# Patient Record
Sex: Male | Born: 1960 | Race: White | Hispanic: No | Smoking: Current every day smoker
Health system: Southern US, Community
[De-identification: ages and names within clinical notes are randomized; demographics above are authoritative.]

## PROBLEM LIST (undated history)

## (undated) DIAGNOSIS — M519 Unspecified thoracic, thoracolumbar and lumbosacral intervertebral disc disorder: Secondary | ICD-10-CM

## (undated) DIAGNOSIS — C801 Malignant (primary) neoplasm, unspecified: Secondary | ICD-10-CM

## (undated) DIAGNOSIS — I639 Cerebral infarction, unspecified: Secondary | ICD-10-CM

## (undated) HISTORY — PX: SKIN CANCER EXCISION: SHX779

## (undated) HISTORY — PX: NOSE SURGERY: SHX723

## (undated) HISTORY — PX: FINGER SURGERY: SHX640

## (undated) HISTORY — PX: MOHS SURGERY: SUR867

---

## 2003-07-04 ENCOUNTER — Encounter: Payer: Self-pay | Admitting: Emergency Medicine

## 2003-07-04 ENCOUNTER — Emergency Department (HOSPITAL_COMMUNITY): Admission: EM | Admit: 2003-07-04 | Discharge: 2003-07-04 | Payer: Self-pay | Admitting: Emergency Medicine

## 2004-09-22 ENCOUNTER — Emergency Department (HOSPITAL_COMMUNITY): Admission: EM | Admit: 2004-09-22 | Discharge: 2004-09-23 | Payer: Self-pay | Admitting: Emergency Medicine

## 2004-11-09 ENCOUNTER — Emergency Department (HOSPITAL_COMMUNITY): Admission: EM | Admit: 2004-11-09 | Discharge: 2004-11-09 | Payer: Self-pay | Admitting: Emergency Medicine

## 2004-12-12 ENCOUNTER — Other Ambulatory Visit: Admission: RE | Admit: 2004-12-12 | Discharge: 2004-12-12 | Payer: Self-pay | Admitting: Dermatology

## 2005-04-30 ENCOUNTER — Emergency Department (HOSPITAL_COMMUNITY): Admission: EM | Admit: 2005-04-30 | Discharge: 2005-04-30 | Payer: Self-pay | Admitting: Emergency Medicine

## 2008-12-09 DIAGNOSIS — C801 Malignant (primary) neoplasm, unspecified: Secondary | ICD-10-CM

## 2008-12-09 HISTORY — DX: Malignant (primary) neoplasm, unspecified: C80.1

## 2009-05-05 ENCOUNTER — Emergency Department (HOSPITAL_COMMUNITY): Admission: EM | Admit: 2009-05-05 | Discharge: 2009-05-05 | Payer: Self-pay | Admitting: Emergency Medicine

## 2009-05-10 ENCOUNTER — Emergency Department (HOSPITAL_COMMUNITY): Admission: EM | Admit: 2009-05-10 | Discharge: 2009-05-10 | Payer: Self-pay | Admitting: Emergency Medicine

## 2009-05-11 ENCOUNTER — Emergency Department (HOSPITAL_COMMUNITY): Admission: RE | Admit: 2009-05-11 | Discharge: 2009-05-11 | Payer: Self-pay | Admitting: Emergency Medicine

## 2009-06-23 ENCOUNTER — Encounter: Admission: RE | Admit: 2009-06-23 | Discharge: 2009-06-23 | Payer: Self-pay | Admitting: Neurosurgery

## 2009-07-12 ENCOUNTER — Encounter: Admission: RE | Admit: 2009-07-12 | Discharge: 2009-07-12 | Payer: Self-pay | Admitting: Neurosurgery

## 2010-11-26 ENCOUNTER — Emergency Department (HOSPITAL_COMMUNITY)
Admission: EM | Admit: 2010-11-26 | Discharge: 2010-11-26 | Payer: Self-pay | Source: Home / Self Care | Admitting: Emergency Medicine

## 2011-02-18 LAB — CBC
HCT: 43 % (ref 39.0–52.0)
Hemoglobin: 16 g/dL (ref 13.0–17.0)
MCH: 34.6 pg — ABNORMAL HIGH (ref 26.0–34.0)
MCV: 93.1 fL (ref 78.0–100.0)
Platelets: 265 10*3/uL (ref 150–400)
RBC: 4.62 MIL/uL (ref 4.22–5.81)
WBC: 9.6 10*3/uL (ref 4.0–10.5)

## 2011-02-18 LAB — DIFFERENTIAL
Eosinophils Absolute: 0.1 10*3/uL (ref 0.0–0.7)
Eosinophils Relative: 1 % (ref 0–5)
Lymphocytes Relative: 34 % (ref 12–46)
Lymphs Abs: 3.3 10*3/uL (ref 0.7–4.0)
Monocytes Absolute: 0.6 10*3/uL (ref 0.1–1.0)
Monocytes Relative: 6 % (ref 3–12)

## 2011-02-18 LAB — BASIC METABOLIC PANEL
CO2: 24 mEq/L (ref 19–32)
Chloride: 108 mEq/L (ref 96–112)
GFR calc Af Amer: >60 mL/min (ref >60)
Potassium: 3.4 mEq/L — ABNORMAL LOW (ref 3.5–5.1)

## 2012-01-23 DIAGNOSIS — C44311 Basal cell carcinoma of skin of nose: Secondary | ICD-10-CM | POA: Insufficient documentation

## 2012-03-31 DIAGNOSIS — Z85828 Personal history of other malignant neoplasm of skin: Secondary | ICD-10-CM | POA: Insufficient documentation

## 2012-05-11 IMAGING — CR DG CHEST 1V PORT
1 series · 1 of 1 positions shown · non-contrast
Comparison: None.

CLINICAL DATA: Shortness of breath; allergic reaction and rash.

PORTABLE CHEST - 1 VIEW

[view not recorded]
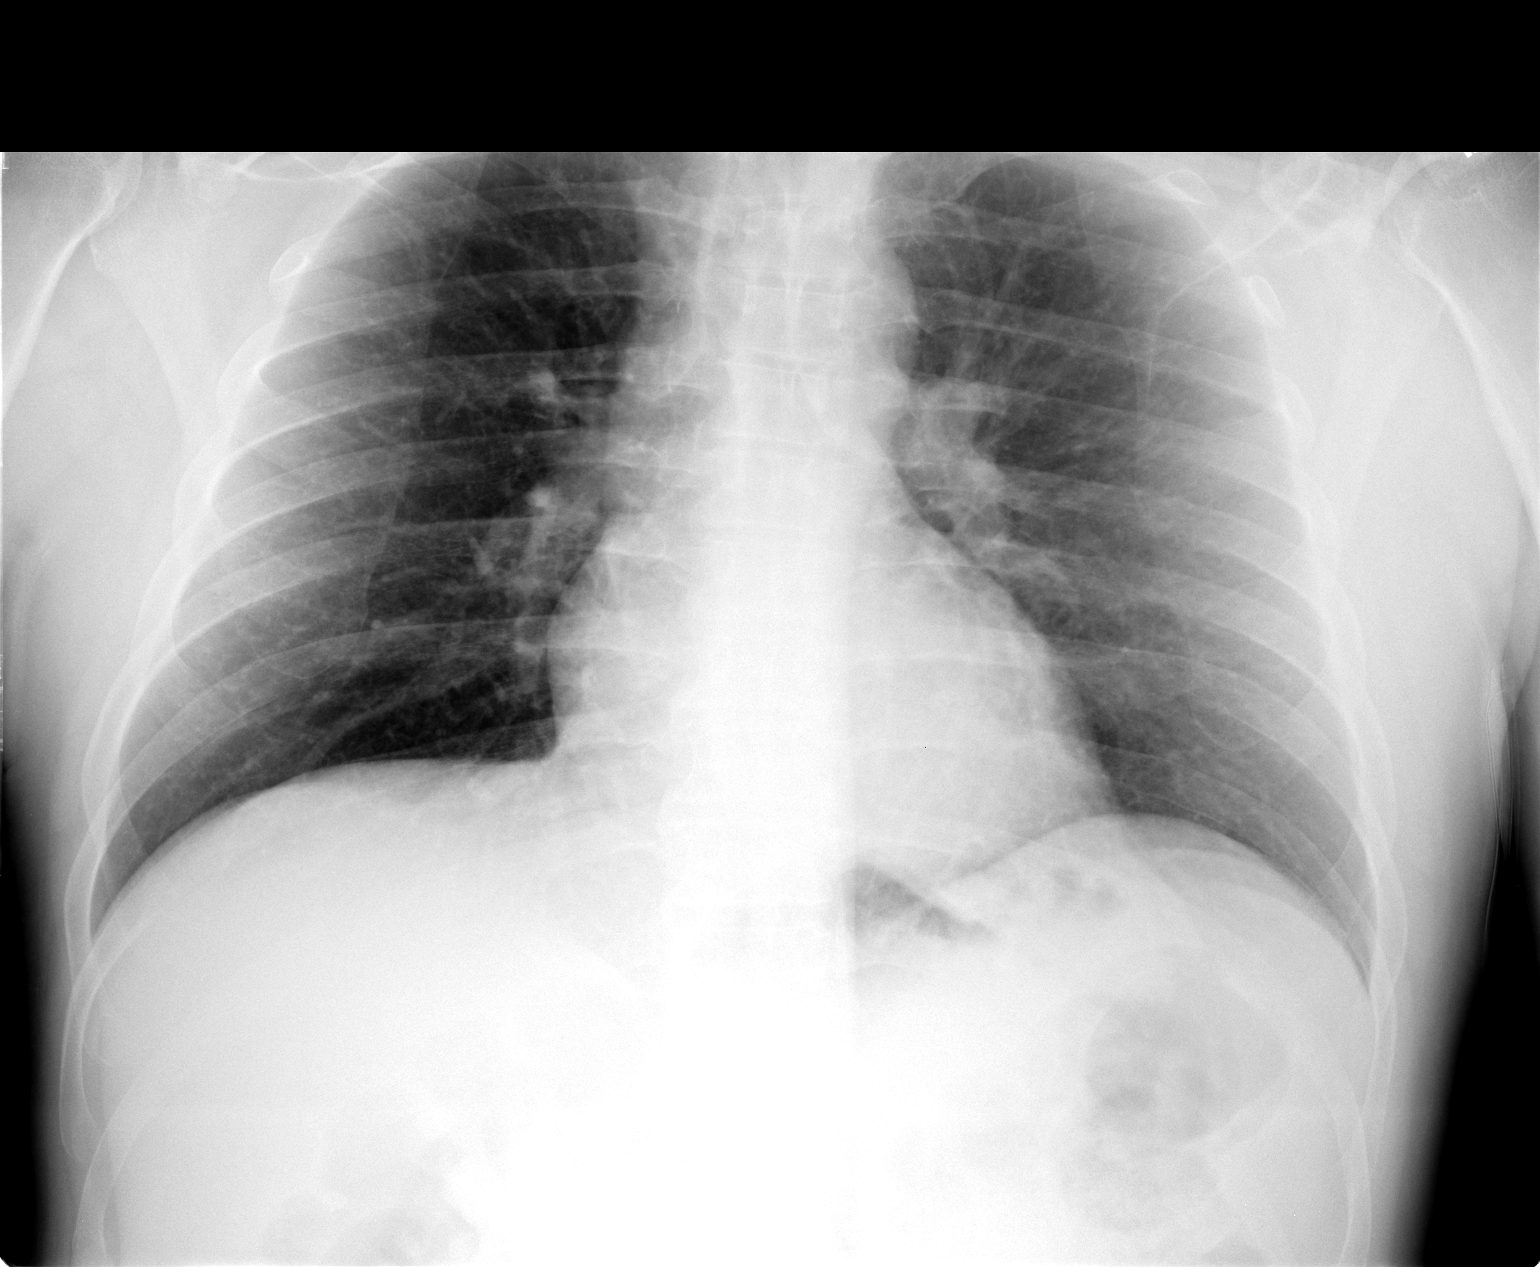

[1 of 1 positions shown; findings below may reference images not displayed]

FINDINGS: The lungs are well-aerated and clear.  There is no
evidence of focal opacification, pleural effusion or pneumothorax.

The cardiomediastinal silhouette is within normal limits.  No acute
osseous abnormalities are seen.
IMPRESSION: No acute cardiopulmonary process seen.

## 2012-09-05 ENCOUNTER — Emergency Department (HOSPITAL_COMMUNITY)
Admission: EM | Admit: 2012-09-05 | Discharge: 2012-09-05 | Disposition: A | Discharge status: Home / Self Care | Payer: Self-pay | Attending: Emergency Medicine | Admitting: Emergency Medicine

## 2012-09-05 ENCOUNTER — Encounter (HOSPITAL_COMMUNITY): Payer: Self-pay | Admitting: *Deleted

## 2012-09-05 DIAGNOSIS — F17200 Nicotine dependence, unspecified, uncomplicated: Secondary | ICD-10-CM | POA: Insufficient documentation

## 2012-09-05 DIAGNOSIS — T7840XA Allergy, unspecified, initial encounter: Secondary | ICD-10-CM | POA: Insufficient documentation

## 2012-09-05 DIAGNOSIS — I10 Essential (primary) hypertension: Secondary | ICD-10-CM | POA: Insufficient documentation

## 2012-09-05 DIAGNOSIS — L509 Urticaria, unspecified: Secondary | ICD-10-CM

## 2012-09-05 MED ORDER — PREDNISONE 50 MG PO TABS: ORAL_TABLET | ORAL | Status: DC

## 2012-09-05 MED ORDER — FAMOTIDINE 20 MG PO TABS
40.0000 mg | ORAL_TABLET | Freq: Once | ORAL | Status: AC
  Administered 2012-09-05: 40 mg via ORAL
  Filled 2012-09-05: qty 2

## 2012-09-05 MED ORDER — DEXAMETHASONE SODIUM PHOSPHATE 4 MG/ML IJ SOLN
10.0000 mg | Freq: Once | INTRAMUSCULAR | Status: AC
  Administered 2012-09-05: 10 mg via INTRAMUSCULAR
  Filled 2012-09-05: qty 3

## 2012-09-05 NOTE — ED Provider Notes (Signed)
History     CSN: [id number]  Arrival date & time 09/05/12  0103   First MD Initiated Contact with Patient 09/05/12 0107      Chief Complaint  Patient presents with  . Allergic Reaction     Patient is a 51 y.o. male presenting with allergic reaction. The history is provided by the patient.  Allergic Reaction The primary symptoms are  rash and urticaria. The primary symptoms do not include wheezing, shortness of breath, diarrhea, dizziness, altered mental status or angioedema. The current episode started 3 to 5 hours ago. The problem has been gradually worsening. This is a new problem.  The rash is associated with itching.  Associated with: unknown. Significant symptoms also include itching.  pt reports urticaria started earlier tonight Unknown cause No angioedema reported   PMH -none  History reviewed. No pertinent past surgical history.  History reviewed. No pertinent family history.  History  Substance Use Topics  . Smoking status: Current Every Day Smoker -- 1.0 packs/day    Types: Cigarettes  . Smokeless tobacco: Not on file  . Alcohol Use: Yes     occasional      Review of Systems  Respiratory: Negative for shortness of breath and wheezing.   Gastrointestinal: Negative for diarrhea.  Skin: Positive for itching and rash.  Neurological: Negative for dizziness and weakness.  Psychiatric/Behavioral: Negative for altered mental status.  All other systems reviewed and are negative.    Allergies  Review of patient's allergies indicates no known allergies.  Home Medications  No current outpatient prescriptions on file.  BP 126/78  Pulse 73  Temp 97.5 F (36.4 C) (Oral)  Resp 18  Ht 5\' 9"  (1.753 m)  Wt 180 lb (81.647 kg)  BMI 26.58 kg/m2  SpO2 98%  Physical Exam CONSTITUTIONAL: Well developed/well nourished HEAD AND FACE: Normocephalic/atraumatic EYES: EOMI/PERRL ENMT: Mucous membranes moist, no angioedema NECK: supple no meningeal  signs SPINE:entire spine nontender CV: S1/S2 noted, no murmurs/rubs/gallops noted LUNGS: Lungs are clear to auscultation bilaterally, no apparent distress ABDOMEN: soft, nontender, no rebound or guarding GU:no cva tenderness NEURO: Pt is awake/alert, moves all extremitiesx4 EXTREMITIES: pulses normal, full ROM SKIN: warm, color normal, urticaria noted PSYCH: no abnormalities of mood noted  ED Course  Procedures   No angioedema.  No wheezing noted Will treat urticaria Already took benadryl at home prior to arrival  1:52 AM Pt stable for d/c home Discussed strict return precautions including tongue/lip swelling/syncope/shortness of breath/wheezing over next 24 hours  MDM  Nursing notes including past medical history and social history reviewed and considered in documentation         Joya Gaskins, MD 09/05/12 [phone]

## 2012-09-05 NOTE — ED Notes (Addendum)
Pt reports large red itching welts over chest, back and arms.  Reporting mild SOB.  States he took 2 benadryl tablets about an hour ago and welts have improved.  States he is unsure what he is allergic to.  Pt reports previous reaction about 1 1/2 years ago.

## 2013-02-25 ENCOUNTER — Emergency Department (HOSPITAL_COMMUNITY)
Admission: EM | Admit: 2013-02-25 | Discharge: 2013-02-25 | Disposition: A | Discharge status: Home / Self Care | Payer: Self-pay | Attending: Emergency Medicine | Admitting: Emergency Medicine

## 2013-02-25 ENCOUNTER — Encounter (HOSPITAL_COMMUNITY): Payer: Self-pay | Admitting: *Deleted

## 2013-02-25 DIAGNOSIS — F17200 Nicotine dependence, unspecified, uncomplicated: Secondary | ICD-10-CM | POA: Insufficient documentation

## 2013-02-25 DIAGNOSIS — M545 Low back pain, unspecified: Secondary | ICD-10-CM | POA: Insufficient documentation

## 2013-02-25 DIAGNOSIS — G8929 Other chronic pain: Secondary | ICD-10-CM | POA: Insufficient documentation

## 2013-02-25 DIAGNOSIS — M549 Dorsalgia, unspecified: Secondary | ICD-10-CM

## 2013-02-25 DIAGNOSIS — R52 Pain, unspecified: Secondary | ICD-10-CM | POA: Insufficient documentation

## 2013-02-25 DIAGNOSIS — Z8739 Personal history of other diseases of the musculoskeletal system and connective tissue: Secondary | ICD-10-CM | POA: Insufficient documentation

## 2013-02-25 MED ORDER — PREDNISONE 10 MG PO TABS: ORAL_TABLET | ORAL | Status: DC

## 2013-02-25 MED ORDER — DICLOFENAC SODIUM 75 MG PO TBEC: 75.0000 mg | DELAYED_RELEASE_TABLET | Freq: Two times a day (BID) | ORAL | Status: DC

## 2013-02-25 MED ORDER — HYDROCODONE-ACETAMINOPHEN 5-325 MG PO TABS: 1.0000 | ORAL_TABLET | ORAL | Status: DC | PRN

## 2013-02-25 NOTE — ED Notes (Signed)
Fell 2 days ago, low back pain

## 2013-02-25 NOTE — ED Provider Notes (Signed)
History     CSN: [id number]  Arrival date & time 02/25/13  1649   First MD Initiated Contact with Patient 02/25/13 1716      Chief Complaint  Patient presents with  . Back Pain    (Consider location/radiation/quality/duration/timing/severity/associated sxs/prior treatment) Patient is a 52 y.o. male presenting with back pain. The history is provided by the patient.  Back Pain Location:  Lumbar spine Quality:  Aching and shooting Radiates to:  R posterior upper leg Pain severity:  Severe Pain is:  Same all the time Duration:  3 days Timing:  Intermittent Progression:  Worsening Chronicity: acute on chronic. Context comment:  Hx of disc disease. Acute on chronic back pain. Relieved by:  Nothing Worsened by:  Nothing tried Ineffective treatments:  None tried Associated symptoms: no abdominal pain, no bladder incontinence, no bowel incontinence, no chest pain, no dysuria and no perianal numbness   Risk factors comment:  Hx of disc disease.   History reviewed. No pertinent past medical history.  Past Surgical History  Procedure Laterality Date  . Nose surgery      History reviewed. No pertinent family history.  History  Substance Use Topics  . Smoking status: Current Every Day Smoker -- 1.00 packs/day    Types: Cigarettes  . Smokeless tobacco: Not on file  . Alcohol Use: Yes     Comment: occasional      Review of Systems  Constitutional: Negative for activity change.       All ROS Neg except as noted in HPI  HENT: Negative for nosebleeds and neck pain.   Eyes: Negative for photophobia and discharge.  Respiratory: Negative for cough, shortness of breath and wheezing.   Cardiovascular: Negative for chest pain and palpitations.  Gastrointestinal: Negative for abdominal pain, blood in stool and bowel incontinence.  Genitourinary: Negative for bladder incontinence, dysuria, frequency and hematuria.  Musculoskeletal: Positive for back pain. Negative for arthralgias.   Skin: Negative.   Neurological: Negative for dizziness, seizures and speech difficulty.  Psychiatric/Behavioral: Negative for hallucinations and confusion.    Allergies  Review of patient's allergies indicates no known allergies.  Home Medications  No current outpatient prescriptions on file.  BP 145/84  Pulse 72  Temp(Src) 97.4 F (36.3 C) (Oral)  Resp 20  Ht 5\' 9"  (1.753 m)  Wt 185 lb (83.915 kg)  BMI 27.31 kg/m2  SpO2 96%  Physical Exam  Nursing note and vitals reviewed. Constitutional: He is oriented to person, place, and time. He appears well-developed and well-nourished.  Non-toxic appearance.  HENT:  Head: Normocephalic.  Right Ear: Tympanic membrane and external ear normal.  Left Ear: Tympanic membrane and external ear normal.  Eyes: EOM and lids are normal. Pupils are equal, round, and reactive to light.  Neck: Normal range of motion. Neck supple. Carotid bruit is not present.  Cardiovascular: Normal rate, regular rhythm, normal heart sounds, intact distal pulses and normal pulses.   Pulmonary/Chest: Breath sounds normal. No respiratory distress.  Abdominal: Soft. Bowel sounds are normal. There is no tenderness. There is no guarding.  Musculoskeletal: Normal range of motion.  There is pain to palpation and with change of position involving the lower lumbar area. There is increased paraspinal area tenderness. There is no palpable deformity appreciated, no step off. No hot areas appreciated. Straight leg raise on the right causes severe pain.  Lymphadenopathy:       Head (right side): No submandibular adenopathy present.       Head (left side): No  submandibular adenopathy present.    He has no cervical adenopathy.  Neurological: He is alert and oriented to person, place, and time. He has normal strength. No cranial nerve deficit or sensory deficit. He exhibits normal muscle tone. Coordination normal.  Skin: Skin is warm and dry.  Psychiatric: He has a normal mood and  affect. His speech is normal.    ED Course  Procedures (including critical care time)  Labs Reviewed - No data to display No results found.   No diagnosis found.    MDM  I have reviewed nursing notes, vital signs, and all appropriate lab and imaging results for this patient. Vital signs stable. Patient has a history of a disc related problems. He was scheduled to have surgery on his back back in 2010, but this did not take place. The patient has not been followed since that time concerning his back. The patient sustained a fall from a standing position on ice and reinjured his lower back. The patient does not have a primary physician, who presents to the emergency department for assistance with his pain.  Plan at this time is for the patient to be on diclofenac, prednisone, Norco.       Kathie Dike, PA-C 02/25/13 1746

## 2013-02-25 NOTE — ED Provider Notes (Signed)
Medical screening examination/treatment/procedure(s) were performed by non-physician practitioner and as supervising physician I was immediately available for consultation/collaboration.  Gilda Crease, MD 02/25/13 [phone]

## 2014-01-12 ENCOUNTER — Emergency Department (HOSPITAL_COMMUNITY)
Admission: EM | Admit: 2014-01-12 | Discharge: 2014-01-12 | Disposition: A | Discharge status: Home / Self Care | Payer: Self-pay | Attending: Emergency Medicine | Admitting: Emergency Medicine

## 2014-01-12 ENCOUNTER — Encounter (HOSPITAL_COMMUNITY): Payer: Self-pay | Admitting: Emergency Medicine

## 2014-01-12 DIAGNOSIS — IMO0002 Reserved for concepts with insufficient information to code with codable children: Secondary | ICD-10-CM | POA: Insufficient documentation

## 2014-01-12 DIAGNOSIS — M549 Dorsalgia, unspecified: Secondary | ICD-10-CM

## 2014-01-12 DIAGNOSIS — Y9389 Activity, other specified: Secondary | ICD-10-CM | POA: Insufficient documentation

## 2014-01-12 DIAGNOSIS — Z85828 Personal history of other malignant neoplasm of skin: Secondary | ICD-10-CM | POA: Insufficient documentation

## 2014-01-12 DIAGNOSIS — F17200 Nicotine dependence, unspecified, uncomplicated: Secondary | ICD-10-CM | POA: Insufficient documentation

## 2014-01-12 DIAGNOSIS — X500XXA Overexertion from strenuous movement or load, initial encounter: Secondary | ICD-10-CM | POA: Insufficient documentation

## 2014-01-12 DIAGNOSIS — Y929 Unspecified place or not applicable: Secondary | ICD-10-CM | POA: Insufficient documentation

## 2014-01-12 DIAGNOSIS — G8929 Other chronic pain: Secondary | ICD-10-CM

## 2014-01-12 HISTORY — DX: Malignant (primary) neoplasm, unspecified: C80.1

## 2014-01-12 MED ORDER — NAPROXEN 250 MG PO TABS
500.0000 mg | ORAL_TABLET | Freq: Once | ORAL | Status: AC
  Administered 2014-01-12: 500 mg via ORAL
  Filled 2014-01-12: qty 2

## 2014-01-12 MED ORDER — DICLOFENAC SODIUM 75 MG PO TBEC: 75.0000 mg | DELAYED_RELEASE_TABLET | Freq: Two times a day (BID) | ORAL | Status: DC

## 2014-01-12 MED ORDER — BACLOFEN 10 MG PO TABS: 10.0000 mg | ORAL_TABLET | Freq: Three times a day (TID) | ORAL | Status: AC

## 2014-01-12 MED ORDER — PREDNISONE 50 MG PO TABS
60.0000 mg | ORAL_TABLET | Freq: Once | ORAL | Status: AC
  Administered 2014-01-12: 60 mg via ORAL
  Filled 2014-01-12 (×2): qty 1

## 2014-01-12 MED ORDER — METHOCARBAMOL 500 MG PO TABS
1000.0000 mg | ORAL_TABLET | Freq: Once | ORAL | Status: AC
  Administered 2014-01-12: 1000 mg via ORAL
  Filled 2014-01-12: qty 2

## 2014-01-12 MED ORDER — DEXAMETHASONE 4 MG PO TABS: ORAL_TABLET | ORAL | Status: DC

## 2014-01-12 NOTE — ED Notes (Signed)
Pt states he stepped into a dip and injured his back. Pt reports prior hx of disc problems. Pt states he was awaiting surgery and found out he had skin ca, had to put off the back surgery.Pt reports radiation of pain down his L leg.

## 2014-01-12 NOTE — ED Provider Notes (Signed)
Medical screening examination/treatment/procedure(s) were performed by non-physician practitioner and as supervising physician I was immediately available for consultation/collaboration.  EKG Interpretation   None         Maudry Diego, MD 01/12/14 2227

## 2014-01-12 NOTE — ED Provider Notes (Signed)
CSN: [id number]     Arrival date & time 01/12/14  1649 History   First MD Initiated Contact with Patient 01/12/14 1823     Chief Complaint  Patient presents with  . Back Pain   (Consider location/radiation/quality/duration/timing/severity/associated sxs/prior Treatment) HPI Comments: Patient is a 53 year old male who presents to the emergency department with complaint of back pain. The patient states that he has a history of" collapsing disc". The patient states that he was recently reaching and stretching while working and re\re injured his lower back. Patient states that he has been scheduled for back surgery, but he developed a skin cancer and had to put the back surgery off until he had skin cancer taken care of. He has not had any loss of bowel or bladder function. He's not had any unusual falls.  Patient is a 54 y.o. male presenting with back pain. The history is provided by the patient.  Back Pain Location:  Lumbar spine Associated symptoms: no abdominal pain, no chest pain and no dysuria     Past Medical History  Diagnosis Date  . Cancer    Past Surgical History  Procedure Laterality Date  . Nose surgery     Family History  Problem Relation Age of Onset  . Cancer Mother   . Parkinson's disease Father   . Alzheimer's disease Father    History  Substance Use Topics  . Smoking status: Current Every Day Smoker -- 1.00 packs/day    Types: Cigarettes  . Smokeless tobacco: Never Used  . Alcohol Use: No     Comment: occasional    Review of Systems  Constitutional: Negative for activity change.       All ROS Neg except as noted in HPI  HENT: Negative for nosebleeds.   Eyes: Negative for photophobia and discharge.  Respiratory: Negative for cough, shortness of breath and wheezing.   Cardiovascular: Negative for chest pain and palpitations.  Gastrointestinal: Negative for abdominal pain and blood in stool.  Genitourinary: Negative for dysuria, frequency and hematuria.   Musculoskeletal: Positive for back pain. Negative for arthralgias and neck pain.  Skin: Positive for wound.  Neurological: Negative for dizziness, seizures and speech difficulty.  Psychiatric/Behavioral: Negative for hallucinations and confusion.    Allergies  Review of patient's allergies indicates no known allergies.  Home Medications  No current outpatient prescriptions on file. BP 125/71  Pulse 83  Temp(Src) 97.7 F (36.5 C) (Oral)  Resp 16  Ht 5\' 9"  (1.753 m)  Wt 180 lb (81.647 kg)  BMI 26.57 kg/m2  SpO2 100% Physical Exam  Nursing note and vitals reviewed. Constitutional: He is oriented to person, place, and time. He appears well-developed and well-nourished.  Non-toxic appearance.  HENT:  Head: Normocephalic.  Right Ear: Tympanic membrane and external ear normal.  Left Ear: Tympanic membrane and external ear normal.  Eyes: EOM and lids are normal. Pupils are equal, round, and reactive to light.  Neck: Normal range of motion. Neck supple. Carotid bruit is not present.  Cardiovascular: Normal rate, regular rhythm, normal heart sounds, intact distal pulses and normal pulses.   Pulmonary/Chest: Breath sounds normal. No respiratory distress.  Abdominal: Soft. Bowel sounds are normal. There is no tenderness. There is no guarding.  Musculoskeletal: Normal range of motion.  There is pain at the right paraspinal and right lumbar area. There is no palpable deformity or step-off. No hot areas appreciated.  Lymphadenopathy:       Head (right side): No submandibular adenopathy present.  Head (left side): No submandibular adenopathy present.    He has no cervical adenopathy.  Neurological: He is alert and oriented to person, place, and time. He has normal strength. No cranial nerve deficit or sensory deficit.  No gross weakness or neurologic deficits appreciated of the lower extremities.  Skin: Skin is warm and dry.  Psychiatric: He has a normal mood and affect. His speech is  normal.    ED Course  Procedures (including critical care time) Labs Review Labs Reviewed - No data to display Imaging Review No results found.  EKG Interpretation   None       MDM  No diagnosis found. **I have reviewed nursing notes, vital signs, and all appropriate lab and imaging results for this patient.*  Patient has history of degenerative disc disease. He thinks he may re\re injured his back while stretching and reaching and straining while working. No gross neurologic deficits appreciated on today's examination. Prescription for Decadron, Voltaren, and baclofen given to the patient. Patient is to see the neurosurgeon and has been working with him as sone as possible for additional evaluation and management of this problem.  Lenox Ahr, PA-C 01/12/14 [phone]

## 2014-01-12 NOTE — Discharge Instructions (Signed)
Chronic Back Pain   When back pain lasts longer than 3 months, it is called chronic back pain.People with chronic back pain often go through certain periods that are more intense (flare-ups).   CAUSES  Chronic back pain can be caused by wear and tear (degeneration) on different structures in your back. These structures include:   The bones of your spine (vertebrae) and the joints surrounding your spinal cord and nerve roots (facets).   The strong, fibrous tissues that connect your vertebrae (ligaments).  Degeneration of these structures may result in pressure on your nerves. This can lead to constant pain.  HOME CARE INSTRUCTIONS   Avoid bending, heavy lifting, prolonged sitting, and activities which make the problem worse.   Take brief periods of rest throughout the day to reduce your pain. Lying down or standing usually is better than sitting while you are resting.   Take over-the-counter or prescription medicines only as directed by your caregiver.  SEEK IMMEDIATE MEDICAL CARE IF:    You have weakness or numbness in one of your legs or feet.   You have trouble controlling your bladder or bowels.   You have nausea, vomiting, abdominal pain, shortness of breath, or fainting.  Document Released: 01/02/2005 Document Revised: 02/17/2012 Document Reviewed: 11/09/2011  [employer] Patient Information 2014 [employer]

## 2014-03-03 ENCOUNTER — Emergency Department (HOSPITAL_COMMUNITY)
Admission: EM | Admit: 2014-03-03 | Discharge: 2014-03-03 | Disposition: A | Discharge status: Home / Self Care | Payer: Self-pay | Attending: Emergency Medicine | Admitting: Emergency Medicine

## 2014-03-03 ENCOUNTER — Encounter (HOSPITAL_COMMUNITY): Payer: Self-pay | Admitting: Emergency Medicine

## 2014-03-03 DIAGNOSIS — Z859 Personal history of malignant neoplasm, unspecified: Secondary | ICD-10-CM | POA: Insufficient documentation

## 2014-03-03 DIAGNOSIS — IMO0002 Reserved for concepts with insufficient information to code with codable children: Secondary | ICD-10-CM | POA: Insufficient documentation

## 2014-03-03 DIAGNOSIS — R0989 Other specified symptoms and signs involving the circulatory and respiratory systems: Secondary | ICD-10-CM | POA: Insufficient documentation

## 2014-03-03 DIAGNOSIS — F17200 Nicotine dependence, unspecified, uncomplicated: Secondary | ICD-10-CM | POA: Insufficient documentation

## 2014-03-03 DIAGNOSIS — Z791 Long term (current) use of non-steroidal anti-inflammatories (NSAID): Secondary | ICD-10-CM | POA: Insufficient documentation

## 2014-03-03 DIAGNOSIS — T7840XA Allergy, unspecified, initial encounter: Secondary | ICD-10-CM

## 2014-03-03 DIAGNOSIS — R0609 Other forms of dyspnea: Secondary | ICD-10-CM | POA: Insufficient documentation

## 2014-03-03 DIAGNOSIS — T4995XA Adverse effect of unspecified topical agent, initial encounter: Secondary | ICD-10-CM | POA: Insufficient documentation

## 2014-03-03 DIAGNOSIS — R21 Rash and other nonspecific skin eruption: Secondary | ICD-10-CM | POA: Insufficient documentation

## 2014-03-03 MED ORDER — ALBUTEROL SULFATE HFA 108 (90 BASE) MCG/ACT IN AERS
2.0000 | INHALATION_SPRAY | RESPIRATORY_TRACT | Status: DC | PRN
  Administered 2014-03-03: 2 via RESPIRATORY_TRACT
  Filled 2014-03-03: qty 6.7

## 2014-03-03 MED ORDER — FAMOTIDINE 20 MG PO TABS
20.0000 mg | ORAL_TABLET | Freq: Once | ORAL | Status: AC
  Administered 2014-03-03: 20 mg via ORAL
  Filled 2014-03-03: qty 1

## 2014-03-03 MED ORDER — PREDNISONE 20 MG PO TABS: 60.0000 mg | ORAL_TABLET | Freq: Every day | ORAL | Status: DC

## 2014-03-03 MED ORDER — DIPHENHYDRAMINE HCL 50 MG/ML IJ SOLN
25.0000 mg | Freq: Once | INTRAMUSCULAR | Status: AC
  Administered 2014-03-03: 25 mg via INTRAVENOUS
  Filled 2014-03-03: qty 1

## 2014-03-03 MED ORDER — METHYLPREDNISOLONE SODIUM SUCC 125 MG IJ SOLR
125.0000 mg | Freq: Once | INTRAMUSCULAR | Status: AC
  Administered 2014-03-03: 125 mg via INTRAVENOUS
  Filled 2014-03-03: qty 2

## 2014-03-03 MED ORDER — ALBUTEROL SULFATE (2.5 MG/3ML) 0.083% IN NEBU
5.0000 mg | INHALATION_SOLUTION | Freq: Once | RESPIRATORY_TRACT | Status: AC
  Administered 2014-03-03: 5 mg via RESPIRATORY_TRACT
  Filled 2014-03-03: qty 6

## 2014-03-03 NOTE — Discharge Instructions (Signed)
Allergies Take Benadryl as needed for any itching. Take prednisone daily as prescribed. Use inhaler as needed. Return here for any return of symptoms or difficulty breathing.   Allergies may happen from anything your body is sensitive to. This may be food, medicines, pollens, chemicals, and nearly anything around you in everyday life that produces allergens. An allergen is anything that causes an allergy producing substance. Heredity is often a factor in causing these problems. This means you may have some of the same allergies as your parents. Food allergies happen in all age groups. Food allergies are some of the most severe and life threatening. Some common food allergies are cow's milk, seafood, eggs, nuts, wheat, and soybeans. SYMPTOMS   Swelling around the mouth.  An itchy red rash or hives.  Vomiting or diarrhea.  Difficulty breathing. SEVERE ALLERGIC REACTIONS ARE LIFE-THREATENING. This reaction is called anaphylaxis. It can cause the mouth and throat to swell and cause difficulty with breathing and swallowing. In severe reactions only a trace amount of food (for example, peanut oil in a salad) may cause death within seconds. Seasonal allergies occur in all age groups. These are seasonal because they usually occur during the same season every year. They may be a reaction to molds, grass pollens, or tree pollens. Other causes of problems are house dust mite allergens, pet dander, and mold spores. The symptoms often consist of nasal congestion, a runny itchy nose associated with sneezing, and tearing itchy eyes. There is often an associated itching of the mouth and ears. The problems happen when you come in contact with pollens and other allergens. Allergens are the particles in the air that the body reacts to with an allergic reaction. This causes you to release allergic antibodies. Through a chain of events, these eventually cause you to release histamine into the blood stream. Although it is  meant to be protective to the body, it is this release that causes your discomfort. This is why you were given anti-histamines to feel better. If you are unable to pinpoint the offending allergen, it may be determined by skin or blood testing. Allergies cannot be cured but can be controlled with medicine. Hay fever is a collection of all or some of the seasonal allergy problems. It may often be treated with simple over-the-counter medicine such as diphenhydramine. Take medicine as directed. Do not drink alcohol or drive while taking this medicine. Check with your caregiver or package insert for child dosages. If these medicines are not effective, there are many new medicines your caregiver can prescribe. Stronger medicine such as nasal spray, eye drops, and corticosteroids may be used if the first things you try do not work well. Other treatments such as immunotherapy or desensitizing injections can be used if all else fails. Follow up with your caregiver if problems continue. These seasonal allergies are usually not life threatening. They are generally more of a nuisance that can often be handled using medicine. HOME CARE INSTRUCTIONS   If unsure what causes a reaction, keep a diary of foods eaten and symptoms that follow. Avoid foods that cause reactions.  If hives or rash are present:  Take medicine as directed.  You may use an over-the-counter antihistamine (diphenhydramine) for hives and itching as needed.  Apply cold compresses (cloths) to the skin or take baths in cool water. Avoid hot baths or showers. Heat will make a rash and itching worse.  If you are severely allergic:  Following a treatment for a severe reaction, hospitalization is  often required for closer follow-up.  Wear a medic-alert bracelet or necklace stating the allergy.  You and your family must learn how to give adrenaline or use an anaphylaxis kit.  If you have had a severe reaction, always carry your anaphylaxis kit  or EpiPen with you. Use this medicine as directed by your caregiver if a severe reaction is occurring. Failure to do so could have a fatal outcome. SEEK MEDICAL CARE IF:  You suspect a food allergy. Symptoms generally happen within 30 minutes of eating a food.  Your symptoms have not gone away within 2 days or are getting worse.  You develop new symptoms.  You want to retest yourself or your child with a food or drink you think causes an allergic reaction. Never do this if an anaphylactic reaction to that food or drink has happened before. Only do this under the care of a caregiver. SEEK IMMEDIATE MEDICAL CARE IF:   You have difficulty breathing, are wheezing, or have a tight feeling in your chest or throat.  You have a swollen mouth, or you have hives, swelling, or itching all over your body.  You have had a severe reaction that has responded to your anaphylaxis kit or an EpiPen. These reactions may return when the medicine has worn off. These reactions should be considered life threatening. MAKE SURE YOU:   Understand these instructions.  Will watch your condition.  Will get help right away if you are not doing well or get worse. Document Released: 02/18/2003 Document Revised: 03/22/2013 Document Reviewed: 07/25/2008 [facility] Patient Information 2014 [employer].

## 2014-03-03 NOTE — ED Provider Notes (Signed)
CSN: [id number]     Arrival date & time 03/03/14  0059 History   First MD Initiated Contact with Patient 03/03/14 0109     Chief Complaint  Patient presents with  . Allergic Reaction     (Consider location/radiation/quality/duration/timing/severity/associated sxs/prior Treatment) HPI History provided by patient. Developed itchy rash to upper extremities, torso and the top of his head earlier tonight. No facial, tongue, lips or throat swelling. He is a smoker and has some baseline dyspnea unchanged. No chest pain or chest tightness. He denies any medications. No new foods. No known exposures including soaps or detergents. Symptoms moderate in severity. No history of same. No recent illness, fevers, chills, cough or congestion. No history of similar reaction in the past.  Past Medical History  Diagnosis Date  . Cancer    Past Surgical History  Procedure Laterality Date  . Nose surgery     Family History  Problem Relation Age of Onset  . Cancer Mother   . Parkinson's disease Father   . Alzheimer's disease Father    History  Substance Use Topics  . Smoking status: Current Every Day Smoker -- 1.00 packs/day    Types: Cigarettes  . Smokeless tobacco: Never Used  . Alcohol Use: No     Comment: occasional    Review of Systems  Constitutional: Negative for fever and chills.  HENT: Negative for drooling, facial swelling, trouble swallowing and voice change.   Respiratory: Negative for shortness of breath.   Cardiovascular: Negative for chest pain.  Gastrointestinal: Negative for vomiting and abdominal pain.  Genitourinary: Negative for dysuria.  Musculoskeletal: Negative for joint swelling.  Skin: Positive for rash.  Neurological: Negative for headaches.  All other systems reviewed and are negative.      Allergies  Review of patient's allergies indicates no known allergies.  Home Medications   Current Outpatient Rx  Name  Route  Sig  Dispense  Refill  . dexamethasone  (DECADRON) 4 MG tablet      1 po bid with food   12 tablet   0   . diclofenac (VOLTAREN) 75 MG EC tablet   Oral   Take 1 tablet (75 mg total) by mouth 2 (two) times daily.   12 tablet   0   . predniSONE (DELTASONE) 20 MG tablet   Oral   Take 3 tablets (60 mg total) by mouth daily.   15 tablet   0    BP 111/78  Pulse 78  Temp(Src) 97.6 F (36.4 C)  Resp 16  Ht 5\' 9"  (1.753 m)  Wt 180 lb (81.647 kg)  BMI 26.57 kg/m2  SpO2 95% Physical Exam  Constitutional: He is oriented to person, place, and time. He appears well-developed and well-nourished.  HENT:  Head: Normocephalic and atraumatic.  Mouth/Throat: Oropharynx is clear and moist.  Uvula midline  Eyes: EOM are normal. Pupils are equal, round, and reactive to light.  Neck: Neck supple.  Cardiovascular: Normal rate, regular rhythm and intact distal pulses.   Pulmonary/Chest: Effort normal and breath sounds normal. No stridor. No respiratory distress. He has no wheezes.  Mildly prolonged expirations  Abdominal: Soft. He exhibits no distension.  Musculoskeletal: Normal range of motion. He exhibits no tenderness.  Neurological: He is alert and oriented to person, place, and time.  Skin: Skin is warm and dry.  Erythematous, blanching an urticarial rash to extremities and torso and scalp. No mucous membrane involvement.    ED Course  Procedures (including critical care time) Labs  Review Labs Reviewed - No data to display Imaging Review No results found.   Treated with IV SoluMedrol, Benadryl and oral Pepcid No wheezing but is a heavy smoker, given albuterol breathing treatment  On recheck his rash and itching is improved and his breathing does feel better. Patient given an inhaler to use as needed. Patient does not have insurance right primary care physician, is able to afford prescription for prednisone. He agrees to take steroids as directed and Benadryl as needed. He agrees to strict return precautions for any  difficulty breathing or return if symptoms. The stable appropriate for discharge home.  MDM   Final diagnoses:  Allergic reaction   Presentation suggest allergic reaction without known allergen. No anaphylaxis. No respiratory compromise. No indication for EpiPen in the ER. Symptomatically improved with medications provided. Patient observed without return of symptoms. Vital signs and nursing notes reviewed and considered.    Teressa Lower, MD 03/03/14 [id number]

## 2014-03-03 NOTE — ED Notes (Addendum)
Pt. Reports allergic reaction started 30 min. Ago. Pt. Has generalized red welts over whole body. Pt. Complains that rash burns and itches. Pt. States that this has happened before but is unsure what causes it. Pt. Alert and oriented. Pt. In no acute distress.

## 2014-05-17 ENCOUNTER — Encounter (HOSPITAL_COMMUNITY): Payer: Self-pay | Admitting: Emergency Medicine

## 2014-05-17 ENCOUNTER — Emergency Department (HOSPITAL_COMMUNITY)
Admission: EM | Admit: 2014-05-17 | Discharge: 2014-05-18 | Disposition: A | Discharge status: Home / Self Care | Payer: Self-pay | Attending: Emergency Medicine | Admitting: Emergency Medicine

## 2014-05-17 DIAGNOSIS — T7840XA Allergy, unspecified, initial encounter: Secondary | ICD-10-CM

## 2014-05-17 DIAGNOSIS — Z859 Personal history of malignant neoplasm, unspecified: Secondary | ICD-10-CM | POA: Insufficient documentation

## 2014-05-17 DIAGNOSIS — F17200 Nicotine dependence, unspecified, uncomplicated: Secondary | ICD-10-CM | POA: Insufficient documentation

## 2014-05-17 DIAGNOSIS — I509 Heart failure, unspecified: Secondary | ICD-10-CM | POA: Insufficient documentation

## 2014-05-17 DIAGNOSIS — L259 Unspecified contact dermatitis, unspecified cause: Secondary | ICD-10-CM | POA: Insufficient documentation

## 2014-05-17 MED ORDER — DIPHENHYDRAMINE HCL 50 MG/ML IJ SOLN
INTRAMUSCULAR | Status: AC
  Filled 2014-05-17: qty 1

## 2014-05-17 MED ORDER — DIPHENHYDRAMINE HCL 50 MG/ML IJ SOLN
25.0000 mg | Freq: Once | INTRAMUSCULAR | Status: AC
  Administered 2014-05-17: 25 mg via INTRAVENOUS

## 2014-05-17 MED ORDER — FAMOTIDINE IN NACL 20-0.9 MG/50ML-% IV SOLN
20.0000 mg | Freq: Once | INTRAVENOUS | Status: AC
  Administered 2014-05-17: 20 mg via INTRAVENOUS
  Filled 2014-05-17: qty 50

## 2014-05-17 MED ORDER — METHYLPREDNISOLONE SODIUM SUCC 125 MG IJ SOLR
125.0000 mg | Freq: Once | INTRAMUSCULAR | Status: AC
  Administered 2014-05-17: 125 mg via INTRAVENOUS
  Filled 2014-05-17: qty 2

## 2014-05-17 MED ORDER — SODIUM CHLORIDE 0.9 % IV BOLUS (SEPSIS)
1000.0000 mL | Freq: Once | INTRAVENOUS | Status: AC
  Administered 2014-05-17: 1000 mL via INTRAVENOUS

## 2014-05-17 NOTE — ED Provider Notes (Signed)
CSN: [id number]     Arrival date & time 05/17/14  2224 History  This chart was scribed for Nat Christen, MD by Rolanda Lundborg, ED Scribe. This patient was seen in room [facility]18/[facility]18 and the patient's care was started at 10:37 PM.   No chief complaint on file. Level 5 Caveat- urgent need for intervention HPI HPI Comments: Jason Walsh is a 53 y.o. male who presents to the Emergency Department complaining of an allergic reaction with sudden-onset itching and tightness of the throat, redness of the face, and tinnitus. He states this is the third time this has happened with unknown allergic phenomenon. No h/o heart problems. Severity is moderate to severe.  Past Medical History  Diagnosis Date  . CHF (congestive heart failure)   . Cancer    Past Surgical History  Procedure Laterality Date  . Nose surgery     Family History  Problem Relation Age of Onset  . Cancer Mother   . Parkinson's disease Father   . Alzheimer's disease Father    History  Substance Use Topics  . Smoking status: Current Every Day Smoker -- 1.00 packs/day    Types: Cigarettes  . Smokeless tobacco: Never Used  . Alcohol Use: No     Comment: occasional    Review of Systems  Unable to perform ROS: Acuity of condition      Allergies  Review of patient's allergies indicates no known allergies.  Home Medications   Prior to Admission medications   Medication Sig Start Date End Date Taking? Authorizing Provider  EPINEPHrine (EPIPEN) 0.3 mg/0.3 mL IJ SOAJ injection Inject 0.3 mLs (0.3 mg total) into the muscle as needed. 05/18/14   Nat Christen, MD  predniSONE (DELTASONE) 20 MG tablet 3 tabs po day one, then 2 po daily x 4 days 05/18/14   Nat Christen, MD   BP 125/56  Pulse 108  Temp(Src) 97.6 F (36.4 C) (Oral)  Resp 36  Wt 180 lb (81.647 kg)  SpO2 92% Physical Exam  Nursing note and vitals reviewed. Constitutional: He is oriented to person, place, and time. He appears well-developed and well-nourished.   Erythematous (beet-red)  HENT:  Head: Normocephalic and atraumatic.  Eyes: Conjunctivae and EOM are normal. Pupils are equal, round, and reactive to light.  Neck: Normal range of motion. Neck supple.  Cardiovascular: Normal rate, regular rhythm and normal heart sounds.   Pulmonary/Chest: Effort normal and breath sounds normal.  Abdominal: Soft. Bowel sounds are normal.  Musculoskeletal: Normal range of motion.  Neurological: He is alert and oriented to person, place, and time.  Skin: Skin is warm and dry.  Psychiatric: He has a normal mood and affect. His behavior is normal.    ED Course  Procedures (including critical care time) Medications  sodium chloride 0.9 % bolus 1,000 mL (0 mLs Intravenous Stopped 05/18/14 0024)  methylPREDNISolone sodium succinate (SOLU-MEDROL) 125 mg/2 mL injection 125 mg (125 mg Intravenous Given 05/17/14 2244)  diphenhydrAMINE (BENADRYL) injection 25 mg (25 mg Intravenous Given 05/17/14 2244)  famotidine (PEPCID) IVPB 20 mg (0 mg Intravenous Stopped 05/17/14 2313)    DIAGNOSTIC STUDIES: Oxygen Saturation is 92% on RA, normal by my interpretation.    COORDINATION OF CARE: 10:41 PM- Discussed treatment plan with pt which includes IV solumedrol, benadryl, and pepcid. Pt agrees to plan.    Labs Review Labs Reviewed - No data to display  Imaging Review No results found.   EKG Interpretation None      MDM   Final  diagnoses:  Allergic reaction    Patient feels much better after IV site Medrol, Benadryl, Pepcid. No airway distress. Discharge medications prednisone and EpiPen  I personally performed the services described in this documentation, which was scribed in my presence. The recorded information has been reviewed and is accurate.    Nat Christen, MD 05/20/14 2107

## 2014-05-17 NOTE — ED Notes (Signed)
Allergic reaction , face red, and discomfort in throat and chest

## 2014-05-18 MED ORDER — EPINEPHRINE 0.3 MG/0.3ML IJ SOAJ: 0.3000 mg | INTRAMUSCULAR | Status: DC | PRN

## 2014-05-18 MED ORDER — PREDNISONE 20 MG PO TABS: ORAL_TABLET | ORAL | Status: DC

## 2014-05-18 NOTE — Discharge Instructions (Signed)
Take Benadryl every 6 hours. Prescription for prednisone and EpiPen

## 2014-06-21 ENCOUNTER — Encounter (HOSPITAL_COMMUNITY): Payer: Self-pay | Admitting: Emergency Medicine

## 2014-06-21 ENCOUNTER — Emergency Department (HOSPITAL_COMMUNITY): Payer: Self-pay

## 2014-06-21 ENCOUNTER — Emergency Department (HOSPITAL_COMMUNITY)
Admission: EM | Admit: 2014-06-21 | Discharge: 2014-06-21 | Disposition: A | Discharge status: Home / Self Care | Payer: Self-pay | Attending: Emergency Medicine | Admitting: Emergency Medicine

## 2014-06-21 DIAGNOSIS — S199XXA Unspecified injury of neck, initial encounter: Secondary | ICD-10-CM

## 2014-06-21 DIAGNOSIS — S79929A Unspecified injury of unspecified thigh, initial encounter: Secondary | ICD-10-CM

## 2014-06-21 DIAGNOSIS — S79919A Unspecified injury of unspecified hip, initial encounter: Secondary | ICD-10-CM | POA: Insufficient documentation

## 2014-06-21 DIAGNOSIS — T07XXXA Unspecified multiple injuries, initial encounter: Secondary | ICD-10-CM

## 2014-06-21 DIAGNOSIS — S46909A Unspecified injury of unspecified muscle, fascia and tendon at shoulder and upper arm level, unspecified arm, initial encounter: Secondary | ICD-10-CM | POA: Insufficient documentation

## 2014-06-21 DIAGNOSIS — S0993XA Unspecified injury of face, initial encounter: Secondary | ICD-10-CM | POA: Insufficient documentation

## 2014-06-21 DIAGNOSIS — S1093XA Contusion of unspecified part of neck, initial encounter: Secondary | ICD-10-CM

## 2014-06-21 DIAGNOSIS — F17200 Nicotine dependence, unspecified, uncomplicated: Secondary | ICD-10-CM | POA: Insufficient documentation

## 2014-06-21 DIAGNOSIS — S0083XA Contusion of other part of head, initial encounter: Secondary | ICD-10-CM | POA: Insufficient documentation

## 2014-06-21 DIAGNOSIS — S0003XA Contusion of scalp, initial encounter: Secondary | ICD-10-CM | POA: Insufficient documentation

## 2014-06-21 DIAGNOSIS — S4980XA Other specified injuries of shoulder and upper arm, unspecified arm, initial encounter: Secondary | ICD-10-CM | POA: Insufficient documentation

## 2014-06-21 DIAGNOSIS — S0990XA Unspecified injury of head, initial encounter: Secondary | ICD-10-CM | POA: Insufficient documentation

## 2014-06-21 MED ORDER — OXYCODONE-ACETAMINOPHEN 5-325 MG PO TABS
2.0000 | ORAL_TABLET | Freq: Once | ORAL | Status: AC
  Administered 2014-06-21: 2 via ORAL
  Filled 2014-06-21: qty 2

## 2014-06-21 MED ORDER — HYDROCODONE-ACETAMINOPHEN 5-325 MG PO TABS: 1.0000 | ORAL_TABLET | ORAL | Status: DC | PRN
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
## 2014-06-21 NOTE — Discharge Instructions (Signed)
Contusion °A contusion is a deep bruise. Contusions are the result of an injury that caused bleeding under the skin. The contusion may turn blue, purple, or yellow. Minor injuries will give you a painless contusion, but more severe contusions may stay painful and swollen for a few weeks.  °CAUSES  °A contusion is usually caused by a blow, trauma, or direct force to an area of the body. °SYMPTOMS  °· Swelling and redness of the injured area. °· Bruising of the injured area. °· Tenderness and soreness of the injured area. °· Pain. °DIAGNOSIS  °The diagnosis can be made by taking a history and physical exam. An X-ray, CT scan, or MRI may be needed to determine if there were any associated injuries, such as fractures. °TREATMENT  °Specific treatment will depend on what area of the body was injured. In general, the best treatment for a contusion is resting, icing, elevating, and applying cold compresses to the injured area. Over-the-counter medicines may also be recommended for pain control. Ask your caregiver what the best treatment is for your contusion. °HOME CARE INSTRUCTIONS  °· Put ice on the injured area. °¨ Put ice in a plastic bag. °¨ Place a towel between your skin and the bag. °¨ Leave the ice on for 15-20 minutes, 3-4 times a day, or as directed by your health care provider. °· Only take over-the-counter or prescription medicines for pain, discomfort, or fever as directed by your caregiver. Your caregiver may recommend avoiding anti-inflammatory medicines (aspirin, ibuprofen, and naproxen) for 48 hours because these medicines may increase bruising. °· Rest the injured area. °· If possible, elevate the injured area to reduce swelling. °SEEK IMMEDIATE MEDICAL CARE IF:  °· You have increased bruising or swelling. °· You have pain that is getting worse. °· Your swelling or pain is not relieved with medicines. °MAKE SURE YOU:  °· Understand these instructions. °· Will watch your condition. °· Will get help right  away if you are not doing well or get worse. °Document Released: 09/04/2005 Document Revised: 11/30/2013 Document Reviewed: 09/30/2011 °[employer]® Patient Information ©2015 [employer]. This information is not intended to replace advice given to you by your health care provider. Make sure you discuss any questions you have with your health care provider. ° °Head Injury °You have received a head injury. It does not appear serious at this time. Headaches and vomiting are common following head injury. It should be easy to awaken from sleeping. Sometimes it is necessary for you to stay in the emergency department for a while for observation. Sometimes admission to the hospital may be needed. After injuries such as yours, most problems occur within the first 24 hours, but side effects may occur up to 7-10 days after the injury. It is important for you to carefully monitor your condition and contact your health care provider or seek immediate medical care if there is a change in your condition. °WHAT ARE THE TYPES OF HEAD INJURIES? °Head injuries can be as minor as a bump. Some head injuries can be more severe. More severe head injuries include: °· A jarring injury to the brain (concussion). °· A bruise of the brain (contusion). This mean there is bleeding in the brain that can cause swelling. °· A cracked skull (skull fracture). °· Bleeding in the brain that collects, clots, and forms a bump (hematoma). °WHAT CAUSES A HEAD INJURY? °A serious head injury is most likely to happen to someone who is in a car wreck and is not wearing   a seat belt. Other causes of major head injuries include bicycle or motorcycle accidents, sports injuries, and falls. °HOW ARE HEAD INJURIES DIAGNOSED? °A complete history of the event leading to the injury and your current symptoms will be helpful in diagnosing head injuries. Many times, pictures of the brain, such as CT or MRI are needed to see the extent of the injury. Often, an overnight  hospital stay is necessary for observation.  °WHEN SHOULD I SEEK IMMEDIATE MEDICAL CARE?  °You should get help right away if: °· You have confusion or drowsiness. °· You feel sick to your stomach (nauseous) or have continued, forceful vomiting. °· You have dizziness or unsteadiness that is getting worse. °· You have severe, continued headaches not relieved by medicine. Only take over-the-counter or prescription medicines for pain, fever, or discomfort as directed by your health care provider. °· You do not have normal function of the arms or legs or are unable to walk. °· You notice changes in the black spots in the center of the colored part of your eye (pupil). °· You have a clear or bloody fluid coming from your nose or ears. °· You have a loss of vision. °During the next 24 hours after the injury, you must stay with someone who can watch you for the warning signs. This person should contact local emergency services (911 in the [place] if you have seizures, you become unconscious, or you are unable to wake up. °HOW CAN I PREVENT A HEAD INJURY IN THE FUTURE? °The most important factor for preventing major head injuries is avoiding motor vehicle accidents.  To minimize the potential for damage to your head, it is crucial to wear seat belts while riding in motor vehicles. Wearing helmets while bike riding and playing collision sports (like football) is also helpful. Also, avoiding dangerous activities around the house will further help reduce your risk of head injury.  °WHEN CAN I RETURN TO NORMAL ACTIVITIES AND ATHLETICS? °You should be reevaluated by your health care provider before returning to these activities. If you have any of the following symptoms, you should not return to activities or contact sports until 1 week after the symptoms have stopped: °· Persistent headache. °· Dizziness or vertigo. °· Poor attention and concentration. °· Confusion. °· Memory problems. °· Nausea or vomiting. °· Fatigue or tire  easily. °· Irritability. °· Intolerant of bright lights or loud noises. °· Anxiety or depression. °· Disturbed sleep. °MAKE SURE YOU:  °· Understand these instructions. °· Will watch your condition. °· Will get help right away if you are not doing well or get worse. °Document Released: 11/25/2005 Document Revised: 11/30/2013 Document Reviewed: 08/02/2013 °ExitCare® Patient Information ©2015 [employer]. This information is not intended to replace advice given to you by your health care provider. Make sure you discuss any questions you have with your health care provider. °RICE: Routine Care for Injuries °The routine care of many injuries includes Rest, Ice, Compression, and Elevation (RICE). °HOME CARE INSTRUCTIONS °· Rest is needed to allow your body to heal. Routine activities can usually be resumed when comfortable. Injured tendons and bones can take up to 6 weeks to heal. Tendons are the cord-like structures that attach muscle to bone. °· Ice following an injury helps keep the swelling down and reduces pain. °¨ Put ice in a plastic bag. °¨ Place a towel between your skin and the bag. °¨ Leave the ice on for 15-20 minutes, 3-4 times a day, or as directed by your health care   provider. Do this while awake, for the first 24 to 48 hours. After that, continue as directed by your caregiver. °· Compression helps keep swelling down. It also gives support and helps with discomfort. If an elastic bandage has been applied, it should be removed and reapplied every 3 to 4 hours. It should not be applied tightly, but firmly enough to keep swelling down. Watch fingers or toes for swelling, bluish discoloration, coldness, numbness, or excessive pain. If any of these problems occur, remove the bandage and reapply loosely. Contact your caregiver if these problems continue. °· Elevation helps reduce swelling and decreases pain. With extremities, such as the arms, hands, legs, and feet, the injured area should be placed near or  above the level of the heart, if possible. °SEEK IMMEDIATE MEDICAL CARE IF: °· You have persistent pain and swelling. °· You develop redness, numbness, or unexpected weakness. °· Your symptoms are getting worse rather than improving after several days. °These symptoms may indicate that further evaluation or further X-rays are needed. Sometimes, X-rays may not show a small broken bone (fracture) until 1 week or 10 days later. Make a follow-up appointment with your caregiver. Ask when your X-ray results will be ready. Make sure you get your X-ray results. °Document Released: 03/09/2001 Document Revised: 11/30/2013 Document Reviewed: 04/26/2011 °[employer]® Patient Information ©2015 [employer]. This information is not intended to replace advice given to you by your health care provider. Make sure you discuss any questions you have with your health care provider. ° °
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
## 2014-07-12 ENCOUNTER — Other Ambulatory Visit (HOSPITAL_COMMUNITY): Payer: Self-pay | Admitting: *Deleted

## 2014-07-12 ENCOUNTER — Ambulatory Visit (HOSPITAL_COMMUNITY)
Admission: RE | Admit: 2014-07-12 | Discharge: 2014-07-12 | Disposition: A | Discharge status: Home / Self Care | Payer: Disability Insurance | Source: Ambulatory Visit | Attending: Diagnostic Radiology | Admitting: Diagnostic Radiology

## 2014-07-12 DIAGNOSIS — M545 Low back pain: Secondary | ICD-10-CM

## 2014-07-12 DIAGNOSIS — M5137 Other intervertebral disc degeneration, lumbosacral region: Secondary | ICD-10-CM | POA: Insufficient documentation

## 2014-07-12 DIAGNOSIS — G8929 Other chronic pain: Secondary | ICD-10-CM | POA: Insufficient documentation

## 2014-07-12 DIAGNOSIS — M51379 Other intervertebral disc degeneration, lumbosacral region without mention of lumbar back pain or lower extremity pain: Secondary | ICD-10-CM | POA: Insufficient documentation

## 2014-12-09 DIAGNOSIS — I639 Cerebral infarction, unspecified: Secondary | ICD-10-CM

## 2014-12-09 HISTORY — DX: Cerebral infarction, unspecified: I63.9

## 2015-01-03 ENCOUNTER — Emergency Department (HOSPITAL_COMMUNITY): Payer: Self-pay

## 2015-01-03 ENCOUNTER — Inpatient Hospital Stay (HOSPITAL_COMMUNITY): Payer: MEDICAID

## 2015-01-03 ENCOUNTER — Encounter (HOSPITAL_COMMUNITY): Payer: Self-pay | Admitting: *Deleted

## 2015-01-03 ENCOUNTER — Inpatient Hospital Stay (HOSPITAL_COMMUNITY): Payer: Self-pay

## 2015-01-03 ENCOUNTER — Inpatient Hospital Stay (HOSPITAL_COMMUNITY)
Admission: EM | Admit: 2015-01-03 | Discharge: 2015-01-05 | DRG: 066 | Disposition: A | Discharge status: Home / Self Care | Payer: Self-pay | Attending: Internal Medicine | Admitting: Internal Medicine

## 2015-01-03 DIAGNOSIS — R519 Headache, unspecified: Secondary | ICD-10-CM

## 2015-01-03 DIAGNOSIS — R2681 Unsteadiness on feet: Secondary | ICD-10-CM | POA: Diagnosis present

## 2015-01-03 DIAGNOSIS — E785 Hyperlipidemia, unspecified: Secondary | ICD-10-CM | POA: Diagnosis present

## 2015-01-03 DIAGNOSIS — Z720 Tobacco use: Secondary | ICD-10-CM | POA: Diagnosis present

## 2015-01-03 DIAGNOSIS — F1410 Cocaine abuse, uncomplicated: Secondary | ICD-10-CM | POA: Diagnosis present

## 2015-01-03 DIAGNOSIS — Z85828 Personal history of other malignant neoplasm of skin: Secondary | ICD-10-CM

## 2015-01-03 DIAGNOSIS — Q2112 Patent foramen ovale: Secondary | ICD-10-CM

## 2015-01-03 DIAGNOSIS — R42 Dizziness and giddiness: Secondary | ICD-10-CM

## 2015-01-03 DIAGNOSIS — Z820 Family history of epilepsy and other diseases of the nervous system: Secondary | ICD-10-CM

## 2015-01-03 DIAGNOSIS — F17210 Nicotine dependence, cigarettes, uncomplicated: Secondary | ICD-10-CM | POA: Diagnosis present

## 2015-01-03 DIAGNOSIS — I639 Cerebral infarction, unspecified: Principal | ICD-10-CM | POA: Diagnosis present

## 2015-01-03 DIAGNOSIS — Z809 Family history of malignant neoplasm, unspecified: Secondary | ICD-10-CM

## 2015-01-03 DIAGNOSIS — R51 Headache: Secondary | ICD-10-CM

## 2015-01-03 DIAGNOSIS — I509 Heart failure, unspecified: Secondary | ICD-10-CM | POA: Diagnosis present

## 2015-01-03 DIAGNOSIS — Q211 Atrial septal defect: Secondary | ICD-10-CM

## 2015-01-03 LAB — RAPID URINE DRUG SCREEN, HOSP PERFORMED
Amphetamines: NOT DETECTED
BARBITURATES: NOT DETECTED
Benzodiazepines: NOT DETECTED
Cocaine: POSITIVE — AB
Opiates: POSITIVE — AB
Tetrahydrocannabinol: NOT DETECTED

## 2015-01-03 LAB — BASIC METABOLIC PANEL
Anion gap: 4 — ABNORMAL LOW (ref 5–15)
BUN: 22 mg/dL (ref 6–23)
CO2: 29 mmol/L (ref 19–32)
Calcium: 9 mg/dL (ref 8.4–10.5)
Chloride: 106 mmol/L (ref 96–112)
Creatinine, Ser: 1.21 mg/dL (ref 0.50–1.35)
GFR calc Af Amer: 77 mL/min — ABNORMAL LOW (ref >90)
GFR calc non Af Amer: 66 mL/min — ABNORMAL LOW (ref >90)
GLUCOSE: 90 mg/dL (ref 70–99)
Potassium: 3.8 mmol/L (ref 3.5–5.1)
SODIUM: 139 mmol/L (ref 135–145)

## 2015-01-03 LAB — CBC WITH DIFFERENTIAL/PLATELET
BASOS ABS: 0 10*3/uL (ref 0.0–0.1)
Basophils Relative: 0 % (ref 0–1)
EOS ABS: 0 10*3/uL (ref 0.0–0.7)
Eosinophils Relative: 0 % (ref 0–5)
HEMATOCRIT: 44 % (ref 39.0–52.0)
Hemoglobin: 15.1 g/dL (ref 13.0–17.0)
LYMPHS PCT: 24 % (ref 12–46)
Lymphs Abs: 2.2 10*3/uL (ref 0.7–4.0)
MCH: 32.9 pg (ref 26.0–34.0)
MCHC: 34.3 g/dL (ref 30.0–36.0)
MCV: 95.9 fL (ref 78.0–100.0)
Monocytes Absolute: 0.6 10*3/uL (ref 0.1–1.0)
Monocytes Relative: 6 % (ref 3–12)
NEUTROS ABS: 6.7 10*3/uL (ref 1.7–7.7)
Neutrophils Relative %: 70 % (ref 43–77)
Platelets: 231 10*3/uL (ref 150–400)
RBC: 4.59 MIL/uL (ref 4.22–5.81)
RDW: 12.3 % (ref 11.5–15.5)
WBC: 9.5 10*3/uL (ref 4.0–10.5)

## 2015-01-03 LAB — TROPONIN I

## 2015-01-03 LAB — MRSA PCR SCREENING: MRSA BY PCR: NEGATIVE

## 2015-01-03 MED ORDER — SENNOSIDES-DOCUSATE SODIUM 8.6-50 MG PO TABS: 1.0000 | ORAL_TABLET | Freq: Every evening | ORAL | Status: DC | PRN

## 2015-01-03 MED ORDER — HYDROCODONE-ACETAMINOPHEN 5-325 MG PO TABS
1.0000 | ORAL_TABLET | ORAL | Status: DC | PRN
  Administered 2015-01-04: 1 via ORAL
  Administered 2015-01-05: 2 via ORAL
  Administered 2015-01-05: 1 via ORAL
  Filled 2015-01-03 (×2): qty 1
  Filled 2015-01-03: qty 2
  Filled 2015-01-03: qty 1

## 2015-01-03 MED ORDER — SODIUM CHLORIDE 0.9 % IV SOLN: Freq: Once | INTRAVENOUS | Status: DC

## 2015-01-03 MED ORDER — ASPIRIN 325 MG PO TABS
325.0000 mg | ORAL_TABLET | Freq: Every day | ORAL | Status: DC
  Administered 2015-01-03 – 2015-01-05 (×3): 325 mg via ORAL
  Filled 2015-01-03 (×3): qty 1

## 2015-01-03 MED ORDER — MECLIZINE HCL 12.5 MG PO TABS
25.0000 mg | ORAL_TABLET | Freq: Once | ORAL | Status: AC
  Administered 2015-01-03: 25 mg via ORAL
  Filled 2015-01-03: qty 2

## 2015-01-03 MED ORDER — ONDANSETRON HCL 4 MG/2ML IJ SOLN
4.0000 mg | Freq: Once | INTRAMUSCULAR | Status: AC
  Administered 2015-01-03: 4 mg via INTRAVENOUS
  Filled 2015-01-03: qty 2

## 2015-01-03 MED ORDER — STROKE: EARLY STAGES OF RECOVERY BOOK
Freq: Once | Status: AC
  Administered 2015-01-03: 17:00:00
  Filled 2015-01-03: qty 1

## 2015-01-03 MED ORDER — MORPHINE SULFATE 4 MG/ML IJ SOLN
4.0000 mg | INTRAMUSCULAR | Status: DC | PRN
  Administered 2015-01-03: 4 mg via INTRAVENOUS
  Filled 2015-01-03: qty 1

## 2015-01-03 MED ORDER — SODIUM CHLORIDE 0.9 % IV BOLUS (SEPSIS)
500.0000 mL | Freq: Once | INTRAVENOUS | Status: AC
  Administered 2015-01-03: 500 mL via INTRAVENOUS

## 2015-01-03 NOTE — H&P (Signed)
[facility] History and Physical  Jason Walsh EQA:[id number] DOB: 1961/08/29 DOA: 01/03/2015  Referring physician: ER PCP: No primary care provider on file.   Chief Complaint: Dizziness, instability of gait.  HPI: Jason Walsh is a 54 y.o. male  This is a 54 year old man who presents with a 2 day history of onset of dizziness which was associated with gait instability. He tended to be her over to the right side every time he tried to walk. He states that his symptoms started after sneezing forcefully twice 2 days ago. He has no history of hypertension that has been documented. He does not have a primary care physician. He felt nauseous and vomited several 2 days ago. He apparently had some sort of erythematous rash on his body and he says that this was his allergic reaction that he sometimes gets. He gave himself Benadryl and became more drowsy. He then fell asleep.  He woke up today still in a unsteady state with his gait. He has noticed that his speech is slightly slurred and he has been having difficulty getting out his words. He thinks his vision is somewhat blurred. He denies any chest pain, palpitations, dyspnea. He does admit to occasionally using cocaine, however he cannot remember when he last used it. He is a smoker of cigarettes. Evaluation in the emergency room with a CT brain scan shows him to have a cerebellar infarct with possible hemorrhage. He is now being admitted for further evaluation and management.   Review of Systems:  Apart from symptoms above, all systems negative.  Past Medical History  Diagnosis Date  . CHF (congestive heart failure)   . Cancer     skin   Past Surgical History  Procedure Laterality Date  . Nose surgery     Social History:  reports that he has been smoking Cigarettes.  He has been smoking about 1.00 pack per day. He has never used smokeless tobacco. He reports that he does not drink alcohol or use illicit drugs.  No Known  Allergies  Family History  Problem Relation Age of Onset  . Cancer Mother   . Parkinson's disease Father   . Alzheimer's disease Father       Prior to Admission medications   Medication Sig Start Date End Date Taking? Authorizing Provider  EPINEPHrine (EPIPEN) 0.3 mg/0.3 mL IJ SOAJ injection Inject 0.3 mLs (0.3 mg total) into the muscle as needed. Patient not taking: Reported on 01/03/2015 05/18/14   Nat Christen, MD  HYDROcodone-acetaminophen (NORCO/VICODIN) 5-325 MG per tablet Take 1-2 tablets by mouth every 4 (four) hours as needed for moderate pain or severe pain. Patient not taking: Reported on 01/03/2015 06/21/14   Delice Bison Ward, DO   Physical Exam: Filed Vitals:   01/03/15 1323 01/03/15 1451  BP: 132/90 144/95  Pulse: 63 61  Temp: 97.9 F (36.6 C)   TempSrc: Oral   Resp: 16 17  Height: 5\' 9"  (1.753 m)   Weight: 79.379 kg (175 lb)   SpO2: 100% 100%    Wt Readings from Last 3 Encounters:  01/03/15 79.379 kg (175 lb)  06/21/14 79.379 kg (175 lb)  05/17/14 81.647 kg (180 lb)    General:  Appears calm and comfortable. He is alert and orientated. Eyes: PERRL, normal lids, irises & conjunctiva ENT: grossly normal hearing, lips & tongue Neck: no LAD, masses or thyromegaly Cardiovascular: RRR, no m/r/g. No LE edema. Telemetry: SR, no arrhythmias  Respiratory: CTA bilaterally, no w/r/r. Normal respiratory effort.  Abdomen: soft, ntnd Skin: no rash or induration seen on limited exam Musculoskeletal: grossly normal tone BUE/BLE Psychiatric: grossly normal mood and affect, speech fluent and appropriate Neurologic: grossly non-focal. surprisingly, he does not appear to have any significant cerebellar signs. He does not have any focal weakness. His speech appears to be normal with no dysarthria or dysphasia.           Labs on Admission:  Basic Metabolic Panel:  Recent Labs Lab 01/03/15 1412  NA 139  K 3.8  CL 106  CO2 29  GLUCOSE 90  BUN 22  CREATININE 1.21  CALCIUM  9.0   Liver Function Tests: No results for input(s): AST, ALT, ALKPHOS, BILITOT, PROT, ALBUMIN in the last 168 hours. No results for input(s): LIPASE, AMYLASE in the last 168 hours. No results for input(s): AMMONIA in the last 168 hours. CBC:  Recent Labs Lab 01/03/15 1412  WBC 9.5  NEUTROABS 6.7  HGB 15.1  HCT 44.0  MCV 95.9  PLT 231   Cardiac Enzymes:  Recent Labs Lab 01/03/15 1412  TROPONINI <0.03    BNP (last 3 results) No results for input(s): PROBNP in the last 8760 hours. CBG: No results for input(s): GLUCAP in the last 168 hours.  Radiological Exams on Admission: Ct Head Wo Contrast  01/03/2015   CLINICAL DATA:  Two week history of headache and dizziness  EXAM: CT HEAD WITHOUT CONTRAST  CT MAXILLOFACIAL WITHOUT CONTRAST  TECHNIQUE: Multidetector CT imaging of the head and maxillofacial structures were performed using the standard protocol without intravenous contrast. Multiplanar CT image reconstructions of the maxillofacial structures were also generated.  COMPARISON:  June 21, 2014  FINDINGS: CT HEAD FINDINGS  The ventricles are normal in size and configuration. There is no intracranial mass, extra-axial fluid collection, or midline shift.  There is decreased attenuation in the left inferomedial cerebellum in the distribution of a portion of the left posterior inferior cerebellar artery. An acute infarct in this area is felt to be present. Cytotoxic edema from this infarct does not efface the fourth ventricle. On axial slice 5 series 2, there is a suspected small area of hemorrhage along the periphery of this infarct inferomedially. No other hemorrhage is appreciable on this study. The fourth ventricle appears unremarkable. Elsewhere, gray-white compartments appear normal. The bony calvarium appears intact. The mastoid air cells are clear.  CT MAXILLOFACIAL FINDINGS  There is no fracture or dislocation. The orbits appear symmetric and normal bilaterally. No intraorbital  lesions are identified.  Paranasal sinuses are clear. Ostiomeatal unit complexes are patent bilaterally. There is mild rightward deviation of the nasal septum. There is middle and inferior nasal turbinate edema bilaterally. There is nares narrowing but no nares obstruction. There is no demonstrable adenopathy. Salivary glands appear symmetric and normal bilaterally.  IMPRESSION: CT head: Recent/acute infarct in the left posterior and inferior, medial left cerebellum involving a portion of the left posterior inferior cerebellar artery distribution. On axial slice 5 series 2, there is an equivocal area of suspected hemorrhage along the inferomedial aspect of this infarct. No other hemorrhage. No mass seen. Study otherwise unremarkable.  CT maxillofacial: No fracture or dislocation. Paranasal sinuses are clear. Ostiomeatal unit complexes are patent. There is nasal turbinate edema bilaterally without nares obstruction. There is mild nasal septal deviation. There is a benign appearing cystic area in the lateral aspect of the C3 vertebral body on the left.  Critical Value/emergent results were called by telephone at the time of interpretation on 01/03/2015 at  2:43 pm to Dr. Tanna Furry , who verbally acknowledged these results.   Electronically Signed   By: Lowella Grip M.D.   On: 01/03/2015 14:45   Ct Maxillofacial Wo Cm  01/03/2015   CLINICAL DATA:  Two week history of headache and dizziness  EXAM: CT HEAD WITHOUT CONTRAST  CT MAXILLOFACIAL WITHOUT CONTRAST  TECHNIQUE: Multidetector CT imaging of the head and maxillofacial structures were performed using the standard protocol without intravenous contrast. Multiplanar CT image reconstructions of the maxillofacial structures were also generated.  COMPARISON:  June 21, 2014  FINDINGS: CT HEAD FINDINGS  The ventricles are normal in size and configuration. There is no intracranial mass, extra-axial fluid collection, or midline shift.  There is decreased attenuation in  the left inferomedial cerebellum in the distribution of a portion of the left posterior inferior cerebellar artery. An acute infarct in this area is felt to be present. Cytotoxic edema from this infarct does not efface the fourth ventricle. On axial slice 5 series 2, there is a suspected small area of hemorrhage along the periphery of this infarct inferomedially. No other hemorrhage is appreciable on this study. The fourth ventricle appears unremarkable. Elsewhere, gray-white compartments appear normal. The bony calvarium appears intact. The mastoid air cells are clear.  CT MAXILLOFACIAL FINDINGS  There is no fracture or dislocation. The orbits appear symmetric and normal bilaterally. No intraorbital lesions are identified.  Paranasal sinuses are clear. Ostiomeatal unit complexes are patent bilaterally. There is mild rightward deviation of the nasal septum. There is middle and inferior nasal turbinate edema bilaterally. There is nares narrowing but no nares obstruction. There is no demonstrable adenopathy. Salivary glands appear symmetric and normal bilaterally.  IMPRESSION: CT head: Recent/acute infarct in the left posterior and inferior, medial left cerebellum involving a portion of the left posterior inferior cerebellar artery distribution. On axial slice 5 series 2, there is an equivocal area of suspected hemorrhage along the inferomedial aspect of this infarct. No other hemorrhage. No mass seen. Study otherwise unremarkable.  CT maxillofacial: No fracture or dislocation. Paranasal sinuses are clear. Ostiomeatal unit complexes are patent. There is nasal turbinate edema bilaterally without nares obstruction. There is mild nasal septal deviation. There is a benign appearing cystic area in the lateral aspect of the C3 vertebral body on the left.  Critical Value/emergent results were called by telephone at the time of interpretation on 01/03/2015 at 2:43 pm to Dr. Tanna Furry , who verbally acknowledged these results.    Electronically Signed   By: Lowella Grip M.D.   On: 01/03/2015 14:45    EKG: Independently reviewed. Normal sinus rhythm without any acute ST-T wave changes.  Assessment/Plan   1. 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 He will be admitted for stroke workup including MRI, echocardiogram and carotid Dopplers. He will be seen by neurology. I'm somewhat hesitant to give him aspirin or heparin at the present time in view of the possible hemorrhage that has been visualized. MRI brain will be useful. 2. Tobacco abuse. 3. History of cocaine abuse. Obtain urine drug screen.  Further recommendations will depend on patient's hospital progress.   Code Status: Full code.  DVT Prophylaxis: SCDs.  Family Communication: I discussed the plan with the patient at the bedside.   Disposition Plan: Home when medically stable.  Time spent: 60 minutes.  Doree Albee [facility] Pager [phone].

## 2015-01-03 NOTE — ED Provider Notes (Signed)
CSN: [id number]     Arrival date & time 01/03/15  1323 History  This chart was scribed for Tanna Furry, MD by Einar Pheasant, ED Scribe. This patient was seen in room [facility]14/[facility]14 and the patient's care was started at 1:45 PM.    Chief Complaint  Patient presents with  . Dizziness    The history is provided by the patient and medical records. No language interpreter was used.   HPI Comments: Jason Walsh is a 54 y.o. male with PMhx of skin CA and CHF listed below presents to the Emergency Department complaining of sudden onset dizziness that started approximately 2 days ago. He states that his symptoms started after sneezing forcefully 2 times Sunday night.  He states that everything went white and "glittery" with his vision. Following that he started having several episodes of emesis   He also states that he  noted erythematous rash to his body.  He took benadryl, and rash resolved, but he vomited  Symptoms listed above have now resolved, however, pt states that ever since then he has been feeling off balance and has to hold on to objects to remain standing. He describes his dizziness as a room spinning sensation. Pt endorses nausea when he feels dizzy. Denies any heart problems. Pt denies fever, neck pain, sore throat,CP, cough, SOB, abdominal pain, nausea, emesis, diarrhea, urinary symptoms, back pai, weakness, numbness and rash as associated symptoms.    Complains of a "bad headache" and indicates to his parietal occipital head on the left.     Past Medical History  Diagnosis Date  . CHF (congestive heart failure)   . Cancer     skin   Past Surgical History  Procedure Laterality Date  . Nose surgery     Family History  Problem Relation Age of Onset  . Cancer Mother   . Parkinson's disease Father   . Alzheimer's disease Father    History  Substance Use Topics  . Smoking status: Current Every Day Smoker -- 1.00 packs/day    Types: Cigarettes  . Smokeless tobacco: Never Used   . Alcohol Use: Yes     Comment: occasional    Review of Systems  Constitutional: Negative for fever, chills, diaphoresis, appetite change and fatigue.  HENT: Negative for mouth sores, sore throat and trouble swallowing.   Eyes: Positive for visual disturbance.  Respiratory: Negative for cough, chest tightness, shortness of breath and wheezing.   Cardiovascular: Negative for chest pain.  Gastrointestinal: Positive for nausea and vomiting. Negative for abdominal pain, diarrhea and abdominal distention.  Endocrine: Negative for polydipsia, polyphagia and polyuria.  Genitourinary: Negative for dysuria, frequency and hematuria.  Musculoskeletal: Negative for gait problem.  Skin: Negative for color change, pallor and rash.  Neurological: Positive for dizziness. Negative for syncope, light-headedness and headaches.  Hematological: Does not bruise/bleed easily.  Psychiatric/Behavioral: Negative for behavioral problems and confusion.      Allergies  Review of patient's allergies indicates no known allergies.  Home Medications   Prior to Admission medications   Medication Sig Start Date End Date Taking? Authorizing Provider  EPINEPHrine (EPIPEN) 0.3 mg/0.3 mL IJ SOAJ injection Inject 0.3 mLs (0.3 mg total) into the muscle as needed. Patient not taking: Reported on 01/03/2015 05/18/14   Nat Christen, MD  HYDROcodone-acetaminophen (NORCO/VICODIN) 5-325 MG per tablet Take 1-2 tablets by mouth every 4 (four) hours as needed for moderate pain or severe pain. Patient not taking: Reported on 01/03/2015 06/21/14   [place], DO  Triage Vitals: BP 132/90 mmHg  Pulse 63  Temp(Src) 97.9 F (36.6 C) (Oral)  Resp 16  Ht 5\' 9"  (1.753 m)  Wt 175 lb (79.379 kg)  BMI 25.83 kg/m2  SpO2 100%  Physical Exam  Constitutional: He is oriented to person, place, and time. He appears well-developed and well-nourished. No distress.  HENT:  Head: Normocephalic.  Eyes: Conjunctivae and EOM are normal.  Pupils are equal, round, and reactive to light. No scleral icterus.  1 beat of nystagmus with lateral gaze.   Neck: Normal range of motion. Neck supple. No thyromegaly present.  Cardiovascular: Normal rate, regular rhythm, normal heart sounds and intact distal pulses.  Exam reveals no gallop and no friction rub.   No murmur heard. Pulmonary/Chest: Effort normal and breath sounds normal. No respiratory distress. He has no wheezes. He has no rales.  Abdominal: Soft. Bowel sounds are normal. He exhibits no distension. There is no tenderness. There is no rebound.  Musculoskeletal: Normal range of motion.  Neurological: He is alert and oriented to person, place, and time. No cranial nerve deficit.  No pronator drift. No weakness. Normal cranial nerves. Normal strength.   Skin: Skin is warm and dry. No rash noted.  Psychiatric: He has a normal mood and affect. His behavior is normal.    ED Course  Procedures (including critical care time)  DIAGNOSTIC STUDIES: Oxygen Saturation is 100% on RA, normal by my interpretation.    COORDINATION OF CARE: 1:54 PM- Pt advised of plan for treatment and pt agrees.  Labs Review Labs Reviewed  BASIC METABOLIC PANEL - Abnormal; Notable for the following:    GFR calc non Af Amer 66 (*)    GFR calc Af Amer 77 (*)    Anion gap 4 (*)    All other components within normal limits  MRSA PCR SCREENING  CBC WITH DIFFERENTIAL/PLATELET  TROPONIN I  DRUGS OF ABUSE SCREEN W/O ALC, ROUTINE URINE  HEMOGLOBIN A1C  LIPID PANEL    Imaging Review Dg Chest 2 View  01/03/2015   CLINICAL DATA:  Difficulty breathing  EXAM: CHEST  2 VIEW  COMPARISON:  November 26, 2010  FINDINGS: There is an ill-defined opacity measuring 2.5 x 1.6 cm just superior to the anterior heart border on the lateral view. This finding is not seen on the frontal view. Lungs elsewhere are clear. Heart size and pulmonary vascularity are normal. No adenopathy. No bone lesions.  IMPRESSION: Somewhat  ill-defined opacity just superior to the anterior heart border seen only on the lateral view. This finding does appear subtly nodular and warrants noncontrast enhanced chest CT to further assess.  Elsewhere lungs are clear.  Heart size within normal limits.   Electronically Signed   By: Lowella Grip M.D.   On: 01/03/2015 20:18   Ct Head Wo Contrast  01/03/2015   CLINICAL DATA:  Two week history of headache and dizziness  EXAM: CT HEAD WITHOUT CONTRAST  CT MAXILLOFACIAL WITHOUT CONTRAST  TECHNIQUE: Multidetector CT imaging of the head and maxillofacial structures were performed using the standard protocol without intravenous contrast. Multiplanar CT image reconstructions of the maxillofacial structures were also generated.  COMPARISON:  June 21, 2014  FINDINGS: CT HEAD FINDINGS  The ventricles are normal in size and configuration. There is no intracranial mass, extra-axial fluid collection, or midline shift.  There is decreased attenuation in the left inferomedial cerebellum in the distribution of a portion of the left posterior inferior cerebellar artery. An acute infarct in this area  is felt to be present. Cytotoxic edema from this infarct does not efface the fourth ventricle. On axial slice 5 series 2, there is a suspected small area of hemorrhage along the periphery of this infarct inferomedially. No other hemorrhage is appreciable on this study. The fourth ventricle appears unremarkable. Elsewhere, gray-white compartments appear normal. The bony calvarium appears intact. The mastoid air cells are clear.  CT MAXILLOFACIAL FINDINGS  There is no fracture or dislocation. The orbits appear symmetric and normal bilaterally. No intraorbital lesions are identified.  Paranasal sinuses are clear. Ostiomeatal unit complexes are patent bilaterally. There is mild rightward deviation of the nasal septum. There is middle and inferior nasal turbinate edema bilaterally. There is nares narrowing but no nares obstruction.  There is no demonstrable adenopathy. Salivary glands appear symmetric and normal bilaterally.  IMPRESSION: CT head: Recent/acute infarct in the left posterior and inferior, medial left cerebellum involving a portion of the left posterior inferior cerebellar artery distribution. On axial slice 5 series 2, there is an equivocal area of suspected hemorrhage along the inferomedial aspect of this infarct. No other hemorrhage. No mass seen. Study otherwise unremarkable.  CT maxillofacial: No fracture or dislocation. Paranasal sinuses are clear. Ostiomeatal unit complexes are patent. There is nasal turbinate edema bilaterally without nares obstruction. There is mild nasal septal deviation. There is a benign appearing cystic area in the lateral aspect of the C3 vertebral body on the left.  Critical Value/emergent results were called by telephone at the time of interpretation on 01/03/2015 at 2:43 pm to Dr. Tanna Furry , who verbally acknowledged these results.   Electronically Signed   By: Lowella Grip M.D.   On: 01/03/2015 14:45   Mr Brain Wo Contrast  01/03/2015   CLINICAL DATA:  Stroke. Severe vertigo for 2 days with blurred vision, headache, vomiting, and nausea.  EXAM: MRI HEAD WITHOUT CONTRAST  MRA HEAD WITHOUT CONTRAST  TECHNIQUE: Multiplanar, multiecho pulse sequences of the brain and surrounding structures were obtained without intravenous contrast. Angiographic images of the head were obtained using MRA technique without contrast.  COMPARISON:  Head CT 01/03/2015  FINDINGS: MRI HEAD FINDINGS  There is a large, acute left PICA territory infarct involving the left cerebellum and inferior vermis. As suspected on CT, there is a small amount of associated petechial hemorrhage anteriorly in the inferior left cerebellum. There is associated cytotoxic edema with partial effacement of the fourth ventricle. There is no hydrocephalus. Cerebral volume is within normal limits for age. A few small foci of T2  hyperintensity in the subcortical cerebral white matter are nonspecific but may reflect minimal chronic small vessel ischemic disease. There is no mass, midline shift or extra-axial fluid collection.  Orbits are unremarkable. Paranasal sinuses and mastoid air cells are clear. Major intracranial vascular flow voids are preserved.  MRA HEAD FINDINGS  Visualized distal vertebral arteries are patent with the left being minimally larger than the right. Proximal PICAs are patent. The left PICA may be occluded more distally, however evaluation is limited by its small size at this level. Right AICA and bilateral SCA origins are patent. Basilar artery is patent without stenosis. There are patent posterior communicating arteries bilaterally. PCAs are unremarkable.  Internal carotid arteries are patent from skullbase to carotid termini without stenosis. ACAs and MCAs are unremarkable. No intracranial aneurysm is identified.  IMPRESSION: 1. Acute, large left PICA territory infarct with small amount of petechial hemorrhage. Edema with partial effacement of the fourth ventricle. No hydrocephalus. 2. No major intracranial arterial  occlusion or significant stenosis. Left PICA is patent proximally but may be occluded more distally.   Electronically Signed   By: Logan Bores   On: 01/03/2015 20:58   Mr Jodene Nam Head/brain Wo Cm  01/03/2015   CLINICAL DATA:  Stroke. Severe vertigo for 2 days with blurred vision, headache, vomiting, and nausea.  EXAM: MRI HEAD WITHOUT CONTRAST  MRA HEAD WITHOUT CONTRAST  TECHNIQUE: Multiplanar, multiecho pulse sequences of the brain and surrounding structures were obtained without intravenous contrast. Angiographic images of the head were obtained using MRA technique without contrast.  COMPARISON:  Head CT 01/03/2015  FINDINGS: MRI HEAD FINDINGS  There is a large, acute left PICA territory infarct involving the left cerebellum and inferior vermis. As suspected on CT, there is a small amount of associated  petechial hemorrhage anteriorly in the inferior left cerebellum. There is associated cytotoxic edema with partial effacement of the fourth ventricle. There is no hydrocephalus. Cerebral volume is within normal limits for age. A few small foci of T2 hyperintensity in the subcortical cerebral white matter are nonspecific but may reflect minimal chronic small vessel ischemic disease. There is no mass, midline shift or extra-axial fluid collection.  Orbits are unremarkable. Paranasal sinuses and mastoid air cells are clear. Major intracranial vascular flow voids are preserved.  MRA HEAD FINDINGS  Visualized distal vertebral arteries are patent with the left being minimally larger than the right. Proximal PICAs are patent. The left PICA may be occluded more distally, however evaluation is limited by its small size at this level. Right AICA and bilateral SCA origins are patent. Basilar artery is patent without stenosis. There are patent posterior communicating arteries bilaterally. PCAs are unremarkable.  Internal carotid arteries are patent from skullbase to carotid termini without stenosis. ACAs and MCAs are unremarkable. No intracranial aneurysm is identified.  IMPRESSION: 1. Acute, large left PICA territory infarct with small amount of petechial hemorrhage. Edema with partial effacement of the fourth ventricle. No hydrocephalus. 2. No major intracranial arterial occlusion or significant stenosis. Left PICA is patent proximally but may be occluded more distally.   Electronically Signed   By: Logan Bores   On: 01/03/2015 20:58   Ct Maxillofacial Wo Cm  01/03/2015   CLINICAL DATA:  Two week history of headache and dizziness  EXAM: CT HEAD WITHOUT CONTRAST  CT MAXILLOFACIAL WITHOUT CONTRAST  TECHNIQUE: Multidetector CT imaging of the head and maxillofacial structures were performed using the standard protocol without intravenous contrast. Multiplanar CT image reconstructions of the maxillofacial structures were also  generated.  COMPARISON:  June 21, 2014  FINDINGS: CT HEAD FINDINGS  The ventricles are normal in size and configuration. There is no intracranial mass, extra-axial fluid collection, or midline shift.  There is decreased attenuation in the left inferomedial cerebellum in the distribution of a portion of the left posterior inferior cerebellar artery. An acute infarct in this area is felt to be present. Cytotoxic edema from this infarct does not efface the fourth ventricle. On axial slice 5 series 2, there is a suspected small area of hemorrhage along the periphery of this infarct inferomedially. No other hemorrhage is appreciable on this study. The fourth ventricle appears unremarkable. Elsewhere, gray-white compartments appear normal. The bony calvarium appears intact. The mastoid air cells are clear.  CT MAXILLOFACIAL FINDINGS  There is no fracture or dislocation. The orbits appear symmetric and normal bilaterally. No intraorbital lesions are identified.  Paranasal sinuses are clear. Ostiomeatal unit complexes are patent bilaterally. There is mild rightward deviation  of the nasal septum. There is middle and inferior nasal turbinate edema bilaterally. There is nares narrowing but no nares obstruction. There is no demonstrable adenopathy. Salivary glands appear symmetric and normal bilaterally.  IMPRESSION: CT head: Recent/acute infarct in the left posterior and inferior, medial left cerebellum involving a portion of the left posterior inferior cerebellar artery distribution. On axial slice 5 series 2, there is an equivocal area of suspected hemorrhage along the inferomedial aspect of this infarct. No other hemorrhage. No mass seen. Study otherwise unremarkable.  CT maxillofacial: No fracture or dislocation. Paranasal sinuses are clear. Ostiomeatal unit complexes are patent. There is nasal turbinate edema bilaterally without nares obstruction. There is mild nasal septal deviation. There is a benign appearing cystic  area in the lateral aspect of the C3 vertebral body on the left.  Critical Value/emergent results were called by telephone at the time of interpretation on 01/03/2015 at 2:43 pm to Dr. Tanna Furry , who verbally acknowledged these results.   Electronically Signed   By: Lowella Grip M.D.   On: 01/03/2015 14:45     EKG Interpretation   Date/Time:  Tuesday January 03 2015 13:58:26 EST Ventricular Rate:  53 PR Interval:  144 QRS Duration: 106 QT Interval:  443 QTC Calculation: 416 R Axis:   -19 Text Interpretation:  Sinus rhythm Borderline left axis deviation Inverted  T wave, Lead III only.  Confirmed by Jeneen Rinks  MD, [place] ([id number]) on 01/03/2015  2:12:35 PM      MDM   Final diagnoses:  Vertigo  Headache   CT shows Lt PICA distribution acute infarct, and small area of hemorrhage adjacent.    I discussed the case with Dr. Merlene Laughter of neurology. Also discussed the case with hospitalist. Patient is to be admitted. His pressure does not need immediate treatment. I discussed findings with patient.    Tanna Furry, MD 01/03/15 2147

## 2015-01-03 NOTE — Progress Notes (Signed)
After assessing pt, Dr. Merlene Laughter recommends transfer to step down unit for 24 hours. Dr. Anastasio Champion notified and agreed and gave order for transfer. Will be transferring soon

## 2015-01-03 NOTE — Consult Note (Signed)
[place] A. Jason Laughter, MD     [email or web address]          Jason Walsh is an 54 y.o. male.   ASSESSMENT/PLAN: 1. Moderate sized left cerebellar infarct. Given the size of the infarct in location with possible encroachment on the fourth ventricle and the risk of acute hydrocephalus from edema/brain swelling, the patient will be moved to the ICU for more close monitoring for the next 24-36 hours. The typical workup is underway including carotid duplex upper, echo, lipid panel, MRI/MRA. UDS. The patient will be started on aspirin. There is a tiny area of possible petechial hemorrhage. The risk of a significant bleed is likely low and therefore the patient should be placed on aspirin. The benefit outweighs the risk. Risk factors seems only to be age and a history of CHF. 2. History of skin cancer. It is possible the patient could have brain metastasis although this seems less likely. MRI should help sort this out however.  The patient is a 54 year old white male who has a history of skin cancer status post procedure a few years ago. He tells me that he has episodes of hives. He had one of his episodes of hives but this time he reported sneezing and developing significant dizziness and just didn't feel right. This is followed by intense nausea vomiting. Symptoms persisted the next day he noted that he also had gait instability. The family reports that he may have had some dysarthria although this is mild. The nausea and vomiting was quite intense and led to the patient come to the emergency room. He also reports again having gait instability leaning to one side. Diplopia may have been present. No focal weakness per se. The left side apparently did not feel right per the patient. The review of systems otherwise negative. The patient denies chest pain, shortness of breath or GU symptoms.   GENERAL: Pleasant male in no acute distress.  HEENT: Supple. Atraumatic normocephalic. Status  post nasal surgery with hair growth on the nostrils.  ABDOMEN: soft  EXTREMITIES: No edema   BACK: Normal.  SKIN: Normal by inspection.    MENTAL STATUS: Alert and oriented. Speech is mildly dysarthric; language and cognition are generally intact. Judgment and insight normal. He reports his age as 38 and month January.  CRANIAL NERVES: Pupils are equal, round and reactive to light and accommodation; extra ocular movements are full, there is no significant nystagmus; visual fields are full; upper and lower facial muscles are normal in strength and symmetric, there is no flattening of the nasolabial folds; tongue is midline; uvula is midline; shoulder elevation is normal.  MOTOR: Normal tone, bulk and strength; no pronator drift. No drift of the upper or lower extremities.  COORDINATION: Left finger to nose is normal, right finger to nose is normal, No rest tremor; no intention tremor; no postural tremor; no bradykinesia.  REFLEXES: Deep tendon reflexes are normal on the right but brisk on the left. Babinski reflexes are flexor on the R and extensor on the L.   SENSATION: Normal to light touch. No extinction to double simultaneous stimulation both actively and visually.  GAIT: Wide and slightly unsteady.  NIH stroke scale 1.  Head CT scan is reviewed in person. There is a hypodensity involving the inferior cerebellar region extending to the mid cerebellar region. This is mostly wedge-shaped. The fourth ventricle appears relatively intact although there appears to be some mild compression of the contralateral side.   Blood pressure 152/74, pulse  63, temperature 97.5 F (36.4 C), temperature source Oral, resp. rate 16, height '5\' 9"'  (1.753 m), weight 79.833 kg (176 lb), SpO2 100 %.  Past Medical History  Diagnosis Date  . CHF (congestive heart failure)   . Cancer     skin    Past Surgical History  Procedure Laterality Date  . Nose surgery      Family History  Problem Relation  Age of Onset  . Cancer Mother   . Parkinson's disease Father   . Alzheimer's disease Father     Social History:  reports that he has been smoking Cigarettes.  He has been smoking about 1.00 pack per day. He has never used smokeless tobacco. He reports that he drinks alcohol. He reports that he does not use illicit drugs.  Allergies: No Known Allergies  Medications: Prior to Admission medications   Medication Sig Start Date End Date Taking? Authorizing Provider  EPINEPHrine (EPIPEN) 0.3 mg/0.3 mL IJ SOAJ injection Inject 0.3 mLs (0.3 mg total) into the muscle as needed. Patient not taking: Reported on 01/03/2015 05/18/14   Nat Christen, MD  HYDROcodone-acetaminophen (NORCO/VICODIN) 5-325 MG per tablet Take 1-2 tablets by mouth every 4 (four) hours as needed for moderate pain or severe pain. Patient not taking: Reported on 01/03/2015 06/21/14   Delice Bison Ward, DO    Scheduled Meds: .  stroke: mapping our early stages of recovery book   Does not apply Once   Continuous Infusions:  PRN Meds:.HYDROcodone-acetaminophen, senna-docusate     Results for orders placed or performed during the hospital encounter of 01/03/15 (from the past 48 hour(s))  CBC with Differential/Platelet     Status: None   Collection Time: 01/03/15  2:12 PM  Result Value Ref Range   WBC 9.5 4.0 - 10.5 K/uL   RBC 4.59 4.22 - 5.81 MIL/uL   Hemoglobin 15.1 13.0 - 17.0 g/dL   HCT 44.0 39.0 - 52.0 %   MCV 95.9 78.0 - 100.0 fL   MCH 32.9 26.0 - 34.0 pg   MCHC 34.3 30.0 - 36.0 g/dL   RDW 12.3 11.5 - 15.5 %   Platelets 231 150 - 400 K/uL   Neutrophils Relative % 70 43 - 77 %   Neutro Abs 6.7 1.7 - 7.7 K/uL   Lymphocytes Relative 24 12 - 46 %   Lymphs Abs 2.2 0.7 - 4.0 K/uL   Monocytes Relative 6 3 - 12 %   Monocytes Absolute 0.6 0.1 - 1.0 K/uL   Eosinophils Relative 0 0 - 5 %   Eosinophils Absolute 0.0 0.0 - 0.7 K/uL   Basophils Relative 0 0 - 1 %   Basophils Absolute 0.0 0.0 - 0.1 K/uL  Basic metabolic panel      Status: Abnormal   Collection Time: 01/03/15  2:12 PM  Result Value Ref Range   Sodium 139 135 - 145 mmol/L   Potassium 3.8 3.5 - 5.1 mmol/L   Chloride 106 96 - 112 mmol/L   CO2 29 19 - 32 mmol/L   Glucose, Bld 90 70 - 99 mg/dL   BUN 22 6 - 23 mg/dL   Creatinine, Ser 1.21 0.50 - 1.35 mg/dL   Calcium 9.0 8.4 - 10.5 mg/dL   GFR calc non Af Amer 66 (L) >90 mL/min   GFR calc Af Amer 77 (L) >90 mL/min    Comment: (NOTE) The eGFR has been calculated using the CKD EPI equation. This calculation has not been validated in all clinical situations. eGFR's  persistently <90 mL/min signify possible Chronic Kidney Disease.    Anion gap 4 (L) 5 - 15  Troponin I     Status: None   Collection Time: 01/03/15  2:12 PM  Result Value Ref Range   Troponin I <0.03 <0.031 ng/mL    Comment:        NO INDICATION OF MYOCARDIAL INJURY.     Studies/Results:  HEAD CT CT head: Recent/acute infarct in the left posterior and inferior, medial left cerebellum involving a portion of the left posterior inferior cerebellar artery distribution. On axial slice 5 series 2, there is an equivocal area of suspected hemorrhage along the inferomedial aspect of this infarct. No other hemorrhage. No mass seen. Study otherwise unremarkable.  CT maxillofacial: No fracture or dislocation. Paranasal sinuses are clear. Ostiomeatal unit complexes are patent. There is nasal turbinate edema bilaterally without nares obstruction. There is mild nasal septal deviation. There is a benign appearing cystic area in the lateral aspect of the C3 vertebral body on the left.   Kreed Kauffman A. Jason Walsh, M.D.  Diplomate, Tax adviser of Psychiatry and Neurology ( Neurology). 01/03/2015, 6:25 PM

## 2015-01-03 NOTE — Progress Notes (Signed)
Ultrasound called and said that the carotid doppler would be completed in the morning tomorrow.

## 2015-01-03 NOTE — ED Notes (Signed)
Pt called  EMS for dizziness; reports being sick since Sunday; VSS

## 2015-01-04 ENCOUNTER — Inpatient Hospital Stay (HOSPITAL_COMMUNITY): Payer: MEDICAID

## 2015-01-04 DIAGNOSIS — I6789 Other cerebrovascular disease: Secondary | ICD-10-CM

## 2015-01-04 DIAGNOSIS — E785 Hyperlipidemia, unspecified: Secondary | ICD-10-CM | POA: Diagnosis present

## 2015-01-04 DIAGNOSIS — R2681 Unsteadiness on feet: Secondary | ICD-10-CM | POA: Diagnosis present

## 2015-01-04 DIAGNOSIS — F1410 Cocaine abuse, uncomplicated: Secondary | ICD-10-CM

## 2015-01-04 LAB — LIPID PANEL
Cholesterol: 171 mg/dL (ref 0–200)
HDL: 26 mg/dL — ABNORMAL LOW (ref >39)
LDL CALC: 102 mg/dL — AB (ref 0–99)
Total CHOL/HDL Ratio: 6.6 RATIO
Triglycerides: 216 mg/dL — ABNORMAL HIGH (ref <150)
VLDL: 43 mg/dL — ABNORMAL HIGH (ref 0–40)

## 2015-01-04 LAB — HEMOGLOBIN A1C
Hgb A1c MFr Bld: 5 % (ref <5.7)
Mean Plasma Glucose: 97 mg/dL (ref <117)

## 2015-01-04 MED ORDER — PRAVASTATIN SODIUM 10 MG PO TABS
20.0000 mg | ORAL_TABLET | Freq: Every day | ORAL | Status: DC
  Administered 2015-01-04 – 2015-01-05 (×2): 20 mg via ORAL
  Filled 2015-01-04 (×2): qty 2

## 2015-01-04 NOTE — Progress Notes (Addendum)
  Echocardiogram 2D Echocardiogram with saline contrast has been performed.  Taylor, Borden 01/04/2015, 3:38 PM

## 2015-01-04 NOTE — Plan of Care (Signed)
Problem: Consults Goal: Ischemic Stroke Patient Education See Patient Education Module for education specifics.  Outcome: Progressing Pt has received stroke booklet but teaching needs reinforced Goal: Skin Care Protocol Initiated - if Braden Score 18 or less If consults are not indicated, leave blank or document N/A  Outcome: Progressing Pt does does not have any skin issues Goal: Nutrition Consult-if indicated Outcome: Progressing Pt has been  tolerating  a full liquid diet with orders to advance as tolerated  Problem: Acute Treatment Outcomes Goal: Airway maintained/protected Outcome: Progressing Pt has been on R/A & O2 sats97 %

## 2015-01-04 NOTE — Care Management Utilization Note (Signed)
UR completed 

## 2015-01-04 NOTE — Progress Notes (Signed)
[facility] PROGRESS NOTE  JAYANTH SZCZESNIAK YQM:[id number] DOB: 12-02-1961 DOA: 01/03/2015 PCP: No primary care provider on file.  Assessment/Plan: 1. Acute left PICA territory infarct. Patient's risk factor for stroke would be hyperlipidemia, tobacco use, cocaine abuse. Carotid Dopplers have been done which did not show any significant stenosis bilaterally. Echocardiogram is currently pending. Neurology is following. He is started on aspirin. Will also start the patient on a statin. Hemoglobin A1c is currently in process. Seen by physical therapy did not recommend any outpatient physical therapy. 2. Hyperlipidemia. LDL noted to be greater than 100. Patient will be started on a statin. 3. Tobacco use. Patient counseled on the importance of tobacco cessation and he is in agreement. 4. Cocaine abuse. Patient extensively counseled on the dangers of ongoing illicit drug use.  Code Status: full code Family Communication: discussed with patient Disposition Plan: discharge home when improved   Consultants:  Neurology  Procedures:  Echo pending  Antibiotics:    HPI/Subjective: Still having difficulty walking due to balance issues, feels steady when using walker. Initially had some blurry vision, but that has since improved. Was reported to have slurring of speech, but this also appears to have improved  Objective: Filed Vitals:   01/04/15 1000  BP: 128/72  Pulse: 70  Temp:   Resp: 17    Intake/Output Summary (Last 24 hours) at 01/04/15 1133 Last data filed at 01/04/15 0522  Gross per 24 hour  Intake      0 ml  Output   2600 ml  Net  -2600 ml   Filed Weights   01/03/15 1323 01/03/15 1642 01/04/15 0500  Weight: 79.379 kg (175 lb) 79.833 kg (176 lb) 80.8 kg (178 lb 2.1 oz)    Exam:   General:  NAD  Cardiovascular: S1, S2 RRR  Respiratory: CTA B  Abdomen: soft, nt, nd, bs+  Musculoskeletal: no edema b/l  Neuro: strength is equal 5/5 in upper and lower  extremities bilaterally   Data Reviewed: Basic Metabolic Panel:  Recent Labs Lab 01/03/15 1412  NA 139  K 3.8  CL 106  CO2 29  GLUCOSE 90  BUN 22  CREATININE 1.21  CALCIUM 9.0   Liver Function Tests: No results for input(s): AST, ALT, ALKPHOS, BILITOT, PROT, ALBUMIN in the last 168 hours. No results for input(s): LIPASE, AMYLASE in the last 168 hours. No results for input(s): AMMONIA in the last 168 hours. CBC:  Recent Labs Lab 01/03/15 1412  WBC 9.5  NEUTROABS 6.7  HGB 15.1  HCT 44.0  MCV 95.9  PLT 231   Cardiac Enzymes:  Recent Labs Lab 01/03/15 1412  TROPONINI <0.03   BNP (last 3 results) No results for input(s): PROBNP in the last 8760 hours. CBG: No results for input(s): GLUCAP in the last 168 hours.  Recent Results (from the past 240 hour(s))  MRSA PCR Screening     Status: None   Collection Time: 01/03/15  9:42 PM  Result Value Ref Range Status   MRSA by PCR NEGATIVE NEGATIVE Final    Comment:        The GeneXpert MRSA Assay (FDA approved for NASAL specimens only), is one component of a comprehensive MRSA colonization surveillance program. It is not intended to diagnose MRSA infection nor to guide or monitor treatment for MRSA infections.      Studies: Dg Chest 2 View  01/03/2015   CLINICAL DATA:  Difficulty breathing  EXAM: CHEST  2 VIEW  COMPARISON:  November 26, 2010  FINDINGS: There is an ill-defined opacity measuring 2.5 x 1.6 cm just superior to the anterior heart border on the lateral view. This finding is not seen on the frontal view. Lungs elsewhere are clear. Heart size and pulmonary vascularity are normal. No adenopathy. No bone lesions.  IMPRESSION: Somewhat ill-defined opacity just superior to the anterior heart border seen only on the lateral view. This finding does appear subtly nodular and warrants noncontrast enhanced chest CT to further assess.  Elsewhere lungs are clear.  Heart size within normal limits.   Electronically  Signed   By: Lowella Grip M.D.   On: 01/03/2015 20:18   Ct Head Wo Contrast  01/03/2015   CLINICAL DATA:  Two week history of headache and dizziness  EXAM: CT HEAD WITHOUT CONTRAST  CT MAXILLOFACIAL WITHOUT CONTRAST  TECHNIQUE: Multidetector CT imaging of the head and maxillofacial structures were performed using the standard protocol without intravenous contrast. Multiplanar CT image reconstructions of the maxillofacial structures were also generated.  COMPARISON:  June 21, 2014  FINDINGS: CT HEAD FINDINGS  The ventricles are normal in size and configuration. There is no intracranial mass, extra-axial fluid collection, or midline shift.  There is decreased attenuation in the left inferomedial cerebellum in the distribution of a portion of the left posterior inferior cerebellar artery. An acute infarct in this area is felt to be present. Cytotoxic edema from this infarct does not efface the fourth ventricle. On axial slice 5 series 2, there is a suspected small area of hemorrhage along the periphery of this infarct inferomedially. No other hemorrhage is appreciable on this study. The fourth ventricle appears unremarkable. Elsewhere, gray-white compartments appear normal. The bony calvarium appears intact. The mastoid air cells are clear.  CT MAXILLOFACIAL FINDINGS  There is no fracture or dislocation. The orbits appear symmetric and normal bilaterally. No intraorbital lesions are identified.  Paranasal sinuses are clear. Ostiomeatal unit complexes are patent bilaterally. There is mild rightward deviation of the nasal septum. There is middle and inferior nasal turbinate edema bilaterally. There is nares narrowing but no nares obstruction. There is no demonstrable adenopathy. Salivary glands appear symmetric and normal bilaterally.  IMPRESSION: CT head: Recent/acute infarct in the left posterior and inferior, medial left cerebellum involving a portion of the left posterior inferior cerebellar artery  distribution. On axial slice 5 series 2, there is an equivocal area of suspected hemorrhage along the inferomedial aspect of this infarct. No other hemorrhage. No mass seen. Study otherwise unremarkable.  CT maxillofacial: No fracture or dislocation. Paranasal sinuses are clear. Ostiomeatal unit complexes are patent. There is nasal turbinate edema bilaterally without nares obstruction. There is mild nasal septal deviation. There is a benign appearing cystic area in the lateral aspect of the C3 vertebral body on the left.  Critical Value/emergent results were called by telephone at the time of interpretation on 01/03/2015 at 2:43 pm to Dr. Tanna Furry , who verbally acknowledged these results.   Electronically Signed   By: Lowella Grip M.D.   On: 01/03/2015 14:45   Mr Brain Wo Contrast  01/03/2015   CLINICAL DATA:  Stroke. Severe vertigo for 2 days with blurred vision, headache, vomiting, and nausea.  EXAM: MRI HEAD WITHOUT CONTRAST  MRA HEAD WITHOUT CONTRAST  TECHNIQUE: Multiplanar, multiecho pulse sequences of the brain and surrounding structures were obtained without intravenous contrast. Angiographic images of the head were obtained using MRA technique without contrast.  COMPARISON:  Head CT 01/03/2015  FINDINGS: MRI HEAD FINDINGS  There is a  large, acute left PICA territory infarct involving the left cerebellum and inferior vermis. As suspected on CT, there is a small amount of associated petechial hemorrhage anteriorly in the inferior left cerebellum. There is associated cytotoxic edema with partial effacement of the fourth ventricle. There is no hydrocephalus. Cerebral volume is within normal limits for age. A few small foci of T2 hyperintensity in the subcortical cerebral white matter are nonspecific but may reflect minimal chronic small vessel ischemic disease. There is no mass, midline shift or extra-axial fluid collection.  Orbits are unremarkable. Paranasal sinuses and mastoid air cells are clear.  Major intracranial vascular flow voids are preserved.  MRA HEAD FINDINGS  Visualized distal vertebral arteries are patent with the left being minimally larger than the right. Proximal PICAs are patent. The left PICA may be occluded more distally, however evaluation is limited by its small size at this level. Right AICA and bilateral SCA origins are patent. Basilar artery is patent without stenosis. There are patent posterior communicating arteries bilaterally. PCAs are unremarkable.  Internal carotid arteries are patent from skullbase to carotid termini without stenosis. ACAs and MCAs are unremarkable. No intracranial aneurysm is identified.  IMPRESSION: 1. Acute, large left PICA territory infarct with small amount of petechial hemorrhage. Edema with partial effacement of the fourth ventricle. No hydrocephalus. 2. No major intracranial arterial occlusion or significant stenosis. Left PICA is patent proximally but may be occluded more distally.   Electronically Signed   By: Logan Bores   On: 01/03/2015 20:58   US Carotid Bilateral  01/04/2015   CLINICAL DATA:  Stroke  EXAM: BILATERAL CAROTID DUPLEX ULTRASOUND  TECHNIQUE: Pearline Cables scale imaging, color Doppler and duplex ultrasound were performed of bilateral carotid and vertebral arteries in the neck.  COMPARISON:  None.  FINDINGS: Criteria: Quantification of carotid stenosis is based on velocity parameters that correlate the residual internal carotid diameter with NASCET-based stenosis levels, using the diameter of the distal internal carotid lumen as the denominator for stenosis measurement.  The following velocity measurements were obtained:  RIGHT  ICA:  83 cm/sec  CCA:  76 cm/sec  SYSTOLIC ICA/CCA RATIO:  1.1  DIASTOLIC ICA/CCA RATIO:  ECA:  85 cm/sec  LEFT  ICA:  88 cm/sec  CCA:  78 cm/sec  SYSTOLIC ICA/CCA RATIO:  1.1  DIASTOLIC ICA/CCA RATIO:  ECA:  79 cm/sec  RIGHT CAROTID ARTERY: Minimal calcified plaque in the bulb. Low resistance internal carotid Doppler  pattern.  RIGHT VERTEBRAL ARTERY:  Antegrade with a normal Doppler pattern.  LEFT CAROTID ARTERY: Minimal intimal thickening of the bulb. Low resistance internal carotid Doppler pattern.  LEFT VERTEBRAL ARTERY:  Antegrade with a normal Doppler pattern.  IMPRESSION: Less than 50% stenosis in the right and left internal carotid arteries.   Electronically Signed   By: Maryclare Bean M.D.   On: 01/04/2015 11:09   Mr Jodene Nam Head/brain Wo Cm  01/03/2015   CLINICAL DATA:  Stroke. Severe vertigo for 2 days with blurred vision, headache, vomiting, and nausea.  EXAM: MRI HEAD WITHOUT CONTRAST  MRA HEAD WITHOUT CONTRAST  TECHNIQUE: Multiplanar, multiecho pulse sequences of the brain and surrounding structures were obtained without intravenous contrast. Angiographic images of the head were obtained using MRA technique without contrast.  COMPARISON:  Head CT 01/03/2015  FINDINGS: MRI HEAD FINDINGS  There is a large, acute left PICA territory infarct involving the left cerebellum and inferior vermis. As suspected on CT, there is a small amount of associated petechial hemorrhage anteriorly in the inferior  left cerebellum. There is associated cytotoxic edema with partial effacement of the fourth ventricle. There is no hydrocephalus. Cerebral volume is within normal limits for age. A few small foci of T2 hyperintensity in the subcortical cerebral white matter are nonspecific but may reflect minimal chronic small vessel ischemic disease. There is no mass, midline shift or extra-axial fluid collection.  Orbits are unremarkable. Paranasal sinuses and mastoid air cells are clear. Major intracranial vascular flow voids are preserved.  MRA HEAD FINDINGS  Visualized distal vertebral arteries are patent with the left being minimally larger than the right. Proximal PICAs are patent. The left PICA may be occluded more distally, however evaluation is limited by its small size at this level. Right AICA and bilateral SCA origins are patent. Basilar  artery is patent without stenosis. There are patent posterior communicating arteries bilaterally. PCAs are unremarkable.  Internal carotid arteries are patent from skullbase to carotid termini without stenosis. ACAs and MCAs are unremarkable. No intracranial aneurysm is identified.  IMPRESSION: 1. Acute, large left PICA territory infarct with small amount of petechial hemorrhage. Edema with partial effacement of the fourth ventricle. No hydrocephalus. 2. No major intracranial arterial occlusion or significant stenosis. Left PICA is patent proximally but may be occluded more distally.   Electronically Signed   By: Logan Bores   On: 01/03/2015 20:58   Ct Maxillofacial Wo Cm  01/03/2015   CLINICAL DATA:  Two week history of headache and dizziness  EXAM: CT HEAD WITHOUT CONTRAST  CT MAXILLOFACIAL WITHOUT CONTRAST  TECHNIQUE: Multidetector CT imaging of the head and maxillofacial structures were performed using the standard protocol without intravenous contrast. Multiplanar CT image reconstructions of the maxillofacial structures were also generated.  COMPARISON:  June 21, 2014  FINDINGS: CT HEAD FINDINGS  The ventricles are normal in size and configuration. There is no intracranial mass, extra-axial fluid collection, or midline shift.  There is decreased attenuation in the left inferomedial cerebellum in the distribution of a portion of the left posterior inferior cerebellar artery. An acute infarct in this area is felt to be present. Cytotoxic edema from this infarct does not efface the fourth ventricle. On axial slice 5 series 2, there is a suspected small area of hemorrhage along the periphery of this infarct inferomedially. No other hemorrhage is appreciable on this study. The fourth ventricle appears unremarkable. Elsewhere, gray-white compartments appear normal. The bony calvarium appears intact. The mastoid air cells are clear.  CT MAXILLOFACIAL FINDINGS  There is no fracture or dislocation. The orbits appear  symmetric and normal bilaterally. No intraorbital lesions are identified.  Paranasal sinuses are clear. Ostiomeatal unit complexes are patent bilaterally. There is mild rightward deviation of the nasal septum. There is middle and inferior nasal turbinate edema bilaterally. There is nares narrowing but no nares obstruction. There is no demonstrable adenopathy. Salivary glands appear symmetric and normal bilaterally.  IMPRESSION: CT head: Recent/acute infarct in the left posterior and inferior, medial left cerebellum involving a portion of the left posterior inferior cerebellar artery distribution. On axial slice 5 series 2, there is an equivocal area of suspected hemorrhage along the inferomedial aspect of this infarct. No other hemorrhage. No mass seen. Study otherwise unremarkable.  CT maxillofacial: No fracture or dislocation. Paranasal sinuses are clear. Ostiomeatal unit complexes are patent. There is nasal turbinate edema bilaterally without nares obstruction. There is mild nasal septal deviation. There is a benign appearing cystic area in the lateral aspect of the C3 vertebral body on the left.  Critical Value/emergent  results were called by telephone at the time of interpretation on 01/03/2015 at 2:43 pm to Dr. Tanna Furry , who verbally acknowledged these results.   Electronically Signed   By: Lowella Grip M.D.   On: 01/03/2015 14:45    Scheduled Meds: . aspirin  325 mg Oral Daily  . pravastatin  20 mg Oral q1800   Continuous Infusions:   Principal Problem:   Cerebellar infarct Active Problems:   Tobacco abuse   Hyperlipidemia   Cocaine abuse   Unsteady gait    Time spent: 52mins    Latish Toutant  [facility] Pager [phone]. If 7PM-7AM, please contact night-coverage at [email or web address], password [facility] 01/04/2015, 11:33 AM  LOS: 1 day

## 2015-01-04 NOTE — Evaluation (Signed)
Occupational Therapy Evaluation Patient Details Name: Jason Walsh MRN: [id number] DOB: 1961-04-25 Today's Date: 01/04/2015    History of Present Illness This is a 54 year old man who presents with a 2 day history of onset of dizziness which was associated with gait instability. He tended to be her over to the right side every time he tried to walk. He states that his symptoms started after sneezing forcefully twice 2 days ago. He has no history of hypertension that has been documented. He does not have a primary care physician. He felt nauseous and vomited several 2 days ago. He apparently had some sort of erythematous rash on his body and he says that this was his allergic reaction that he sometimes gets.   Clinical Impression   Patient presents to OT with 5/5 BUE strength and WNL fine and gross motor coordination. No additional OT services needed at this time.     Follow Up Recommendations  No OT follow up    Equipment Recommendations  None recommended by OT       Precautions / Restrictions Precautions Precautions: Fall Restrictions Weight Bearing Restrictions: No              ADL Overall ADL's : Independent                                       General ADL Comments: LB Dressing and functional transfer completed independently.         Perception Perception Spatial deficits: Pt reports dizziness associated with headache at times. Slight unstable gait when walking.    Praxis Praxis Praxis tested?: Within functional limits    Pertinent Vitals/Pain Pain Assessment: 0-10 Pain Score: 5  Pain Location: headache Pain Descriptors / Indicators: Aching Pain Intervention(s): Premedicated before session     Hand Dominance Right   Extremity/Trunk Assessment Upper Extremity Assessment Upper Extremity Assessment: Overall WFL for tasks assessed   Lower Extremity Assessment Lower Extremity Assessment: Defer to PT evaluation       Communication  Communication Communication: No difficulties   Cognition Arousal/Alertness: Awake/alert Behavior During Therapy: WFL for tasks assessed/performed Overall Cognitive Status: Within Functional Limits for tasks assessed                                Home Living Family/patient expects to be discharged to:: Private residence Living Arrangements: Other relatives (neice) Available Help at Discharge: Available PRN/intermittently;Family Type of Home: House Home Access: Stairs to enter Technical brewer of Steps: 1   Home Layout: One level     Bathroom Shower/Tub: Chief Strategy Officer: Grab bars - toilet;Grab bars - tub/shower          Prior Functioning/Environment Level of Independence: Independent                                    Co-evaluation PT/OT/SLP Co-Evaluation/Treatment: Yes Reason for Co-Treatment: For patient/therapist safety   OT goals addressed during session: ADL's and self-care;Strengthening/ROM      End of Session Equipment Utilized During Treatment: Gait belt;Rolling walker  Activity Tolerance: Patient tolerated treatment well Patient left: in chair;with call bell/phone within reach;with chair alarm set   Time: [id number] OT Time Calculation (min): 30 min Charges:  OT General  Charges $OT Visit: 1 Procedure OT Evaluation $Initial OT Evaluation Tier I: 1 Procedure G-Codes:    Ailene Ravel, OTR/L,CBIS  [phone]  01/04/2015, 9:10 AM

## 2015-01-04 NOTE — Clinical Social Work Psychosocial (Signed)
Clinical Social Work Department BRIEF PSYCHOSOCIAL ASSESSMENT 01/04/2015  Patient:  Jason Walsh, Jason Walsh     Account Number:  [id number]     Admit date:  01/03/2015  Clinical Social Worker:  Legrand Como  Date/Time:  01/04/2015 11:12 AM  Referred by:  CSW  Date Referred:  01/04/2015 Referred for  Substance Abuse   Other Referral:   Interview type:  Patient Other interview type:    PSYCHOSOCIAL DATA Living Status:  FAMILY Admitted from facility:   Level of care:   Primary support name:  Jola Babinski Primary support relationship to patient:  FAMILY Degree of support available:   Patient reports that his brother-in-law, Jola Babinski, is his best support.    CURRENT CONCERNS Current Concerns  Substance Abuse   Other Concerns:    SOCIAL WORK ASSESSMENT / PLAN CSW met with patient who were alert and oriented.  Patient stated that he was aware that he had had a stroke. He stated that he initially thought that he had vertigo due to dizziness and being unable to walk due to lack of balance. Patient stated that he then came to the emergency room after this went on for days.  Patient stated that he resides with his sister and niece.  He stated that recently his sister moved in with another sister due to her breathing difficulties so now it is him and his niece in the home.  Patient stated that he does not have a lot of support.  He indicated that his brother-in-law, Jola Babinski, is his greatest supporter.  Patient indicated that he has license but does not have a vehicle.  Patient stated that at baseline he walks unassisted and does not need assistance with his ADL's. Patient indicated that he believes now since his stroke he will have to ambulate with the assistance of a walker. Patient indicated that he was surprised by his diagnosis of stroke.  Patient admitted that he has an issue with substance abuse/using crack cocaine. Patient indicated that he typically smokes crack when he gets bored.  Patient  indicated that he does not plan on using crack anymore as he realizes that it could have contributed to him having a stroke.  CSW discussed SA treatment options with patient and provided him with a listing of areas MH/SA resources.  CSW offered to schedule an appointment for SA treatment, however patient declined the offer at this time. Patient indicated that he has been to treatment in the past.  He stated that he went to treatment in [place] several years ago.  He reported that more recently he attended [facility] at [facility] for ten months. He stated that he did not currently have the transportation to return to SAIOP five days a week as it is scheduled. Patient stated "I don't intend on doing that shit anymore." Patient agreed that he would refer to the referral listing when he desired to make an appointment for SA treatment.   Assessment/plan status:  Information/Referral to [employer] Other assessment/ plan:   Information/referral to community resources:    PATIENT'S/FAMILY'S RESPONSE TO PLAN OF CARE: Patient agreeable to return to his home. He stated that he will refer to [place] treatment listing when he desires to make an appointment. Patient also agreed to contact social worker if he desired assistance with scheduling appointment while in inpatient at [facility].    Ambrose Pancoast, [place]

## 2015-01-04 NOTE — Progress Notes (Signed)
Patient ID: Jason Walsh, male   DOB: 1961/11/15, 54 y.o.   MRN: [id number]  Jason A. Merlene Laughter, MD     [email or web address]          Jason Walsh is an 54 y.o. male.   Assessment/Plan: 1. Moderate sized left cerebellar infarct. Given the size of the infarct in location with possible encroachment on the fourth ventricle and the risk of acute hydrocephalus from edema/brain swelling, the patient will be moved to the ICU for more close monitoring for the next 24-36 hours. The typical workup is underway including carotid duplex upper, echo, lipid panel, MRI/MRA. UDS. The patient will be started on aspirin. There is a tiny area of possible petechial hemorrhage. The risk of a significant bleed is likely low and therefore the patient should be placed on aspirin. The benefit outweighs the risk. Risk factors seems only to be age and a history of CHF. 2. History of skin cancer. It is possible the patient could have brain metastasis although this seems less likely. MRI should help sort this out however. 3. COCAINE ABUSE. Likely contribute markedly to stroke. Discussed w pt. 4. Dyslipidemia. Suggest statin. Pravastatin already started.  The patient's nausea vomiting has improved significantly. He does still has some disequilibrium/vertiginous symptoms with movements.   GENERAL: Pleasant male in no acute distress.  HEENT: Supple. Atraumatic normocephalic. Status post nasal surgery with hair growth on the nostrils.  ABDOMEN: soft  EXTREMITIES: No edema   BACK: Normal.  SKIN: Normal by inspection.   MENTAL STATUS: Alert and oriented. Speech is mildly dysarthric; language and cognition are generally intact. Judgment and insight normal. He reports his age as 38 and month January.  CRANIAL NERVES: Pupils are equal, round and reactive to light and accommodation; extra ocular movements are full, there is no significant nystagmus; visual fields are full; upper and lower facial  muscles are normal in strength and symmetric, there is no flattening of the nasolabial folds; tongue is midline; uvula is midline; shoulder elevation is normal.  MOTOR: Normal tone, bulk and strength; no pronator drift. No drift of the upper or lower extremities.  COORDINATION: Left finger to nose is normal, right finger to nose is normal, No rest tremor; no intention tremor; no postural tremor; no bradykinesia.  REFLEXES: Deep tendon reflexes are normal on the right but brisk on the left. Babinski reflexes are flexor on the R and extensor on the L.   SENSATION: Normal to light touch. No extinction to double simultaneous stimulation both actively and visually.  GAIT: Wide and slightly unsteady.   The brain MRI is reviewed in person. There is some effacement of the fourth ventricle. No evidence of hydrocephalus however. There is a large infarcts seen involving the inferior cerebellum extending to nose cerebral region. It approximates the PICA distribution.    Objective: Vital signs in last 24 hours: Temp:  [97.5 F (36.4 C)-98.5 F (36.9 C)] 97.6 F (36.4 C) (01/27 0752) Pulse Rate:  [52-70] 70 (01/27 1000) Resp:  [9-18] 17 (01/27 1000) BP: (113-152)/(67-95) 128/72 mmHg (01/27 1000) SpO2:  [93 %-100 %] 97 % (01/27 1000) Weight:  [79.379 kg (175 lb)-80.8 kg (178 lb 2.1 oz)] 80.8 kg (178 lb 2.1 oz) (01/27 0500)  Intake/Output from previous day: 01/26 0701 - 01/27 0700 In: -  Out: 2600 [Urine:2600] Intake/Output this shift:   Nutritional status: Diet regular   Lab Results: Results for orders placed or performed during the hospital encounter of 01/03/15 (from the past 48  hour(s))  CBC with Differential/Platelet     Status: None   Collection Time: 01/03/15  2:12 PM  Result Value Ref Range   WBC 9.5 4.0 - 10.5 K/uL   RBC 4.59 4.22 - 5.81 MIL/uL   Hemoglobin 15.1 13.0 - 17.0 g/dL   HCT 44.0 39.0 - 52.0 %   MCV 95.9 78.0 - 100.0 fL   MCH 32.9 26.0 - 34.0 pg   MCHC 34.3 30.0 -  36.0 g/dL   RDW 12.3 11.5 - 15.5 %   Platelets 231 150 - 400 K/uL   Neutrophils Relative % 70 43 - 77 %   Neutro Abs 6.7 1.7 - 7.7 K/uL   Lymphocytes Relative 24 12 - 46 %   Lymphs Abs 2.2 0.7 - 4.0 K/uL   Monocytes Relative 6 3 - 12 %   Monocytes Absolute 0.6 0.1 - 1.0 K/uL   Eosinophils Relative 0 0 - 5 %   Eosinophils Absolute 0.0 0.0 - 0.7 K/uL   Basophils Relative 0 0 - 1 %   Basophils Absolute 0.0 0.0 - 0.1 K/uL  Basic metabolic panel     Status: Abnormal   Collection Time: 01/03/15  2:12 PM  Result Value Ref Range   Sodium 139 135 - 145 mmol/L   Potassium 3.8 3.5 - 5.1 mmol/L   Chloride 106 96 - 112 mmol/L   CO2 29 19 - 32 mmol/L   Glucose, Bld 90 70 - 99 mg/dL   BUN 22 6 - 23 mg/dL   Creatinine, Ser 1.21 0.50 - 1.35 mg/dL   Calcium 9.0 8.4 - 10.5 mg/dL   GFR calc non Af Amer 66 (L) >90 mL/min   GFR calc Af Amer 77 (L) >90 mL/min    Comment: (NOTE) The eGFR has been calculated using the CKD EPI equation. This calculation has not been validated in all clinical situations. eGFR's persistently <90 mL/min signify possible Chronic Kidney Disease.    Anion gap 4 (L) 5 - 15  Troponin I     Status: None   Collection Time: 01/03/15  2:12 PM  Result Value Ref Range   Troponin I <0.03 <0.031 ng/mL    Comment:        NO INDICATION OF MYOCARDIAL INJURY.   MRSA PCR Screening     Status: None   Collection Time: 01/03/15  9:42 PM  Result Value Ref Range   MRSA by PCR NEGATIVE NEGATIVE    Comment:        The GeneXpert MRSA Assay (FDA approved for NASAL specimens only), is one component of a comprehensive MRSA colonization surveillance program. It is not intended to diagnose MRSA infection nor to guide or monitor treatment for MRSA infections.   Urine rapid drug screen (hosp performed)     Status: Abnormal   Collection Time: 01/03/15 10:53 PM  Result Value Ref Range   Opiates POSITIVE (A) NONE DETECTED   Cocaine POSITIVE (A) NONE DETECTED   Benzodiazepines NONE  DETECTED NONE DETECTED   Amphetamines NONE DETECTED NONE DETECTED   Tetrahydrocannabinol NONE DETECTED NONE DETECTED   Barbiturates NONE DETECTED NONE DETECTED    Comment:        DRUG SCREEN FOR MEDICAL PURPOSES ONLY.  IF CONFIRMATION IS NEEDED FOR ANY PURPOSE, NOTIFY LAB WITHIN 5 DAYS.        LOWEST DETECTABLE LIMITS FOR URINE DRUG SCREEN Drug Class       Cutoff (ng/mL) Amphetamine      1000 Barbiturate  200 Benzodiazepine   604 Tricyclics       540 Opiates          300 Cocaine          300 THC              50   Lipid panel     Status: Abnormal   Collection Time: 01/04/15  4:57 AM  Result Value Ref Range   Cholesterol 171 0 - 200 mg/dL   Triglycerides 216 (H) <150 mg/dL   HDL 26 (L) >39 mg/dL   Total CHOL/HDL Ratio 6.6 RATIO   VLDL 43 (H) 0 - 40 mg/dL   LDL Cholesterol 102 (H) 0 - 99 mg/dL    Comment:        Total Cholesterol/HDL:CHD Risk Coronary Heart Disease Risk Table                     Men   Women  1/2 Average Risk   3.4   3.3  Average Risk       5.0   4.4  2 X Average Risk   9.6   7.1  3 X Average Risk  23.4   11.0        Use the calculated Patient Ratio above and the CHD Risk Table to determine the patient's CHD Risk.        ATP III CLASSIFICATION (LDL):  <100     mg/dL   Optimal  100-129  mg/dL   Near or Above                    Optimal  130-159  mg/dL   Borderline  160-189  mg/dL   High  >190     mg/dL   Very High     Lipid Panel  Recent Labs  01/04/15 0457  CHOL 171  TRIG 216*  HDL 26*  CHOLHDL 6.6  VLDL 43*  LDLCALC 102*    Studies/Results: BRAIN MRI/MRA Visualized distal vertebral arteries are patent with the left being minimally larger than the right. Proximal PICAs are patent. The left PICA may be occluded more distally, however evaluation is limited by its small size at this level. Right AICA and bilateral SCA origins are patent. Basilar artery is patent without stenosis. There are patent posterior communicating arteries  bilaterally. PCAs are unremarkable.  Internal carotid arteries are patent from skullbase to carotid termini without stenosis. ACAs and MCAs are unremarkable. No intracranial aneurysm is identified.  IMPRESSION: 1. Acute, large left PICA territory infarct with small amount of petechial hemorrhage. Edema with partial effacement of the fourth ventricle. No hydrocephalus. 2. No major intracranial arterial occlusion or significant stenosis. Left PICA is patent proximally but may be occluded more distally   CAROTID DOPPLERS normal.  Medications:  Scheduled Meds: . aspirin  325 mg Oral Daily  . pravastatin  20 mg Oral q1800   Continuous Infusions:  PRN Meds:.HYDROcodone-acetaminophen, senna-docusate     LOS: 1 day   Marlow Berenguer A. Merlene Walsh, M.D.  Diplomate, Tax adviser of Psychiatry and Neurology ( Neurology).

## 2015-01-04 NOTE — Evaluation (Signed)
Physical Therapy Evaluation Patient Details Name: Jason Walsh MRN: [id number] DOB: 03-16-1961 Today's Date: 01/04/2015   History of Present Illness  This is a 54 year old man who presents with a 2 day history of onset of dizziness which was associated with gait instability. He tended to be her over to the right side every time he tried to walk. He states that his symptoms started after sneezing forcefully twice 2 days ago. He has no history of hypertension that has been documented. He does not have a primary care physician. He felt nauseous and vomited several 2 days ago. He apparently had some sort of erythematous rash on his body and he says that this was his allergic reaction that he sometimes gets.  Clinical Impression  Pt is a 54 year old male who presents to PT with dx of cerebellar infarct.  Per RN and pt, some dizziness experiences this morning along with HA.  Vestibular screen performed without significant findings or increases in dizziness.  No dizziness reported with functional mobility skills, only slight headache after gait assessment.  Pt was mod (I) with bed mobility skills and transfers.   Gait assessment completed without AD, though due to balance issues pt was transitioned to std cane then RW.  Noted increased stability with gait with RW, and pt reports he feels most steady with RW.  Recommend continued PT in the hospital to assess balance, and continued gait training with use of AD or without as tolerated.  Recommended to pt for intermittent supervision at discharge, and pt reports he may be able to go stay with his daughter, though daughter has a newborn so he is unsure. Recommend use of RW for gait for balance and safety.     Follow Up Recommendations Other (comment);No PT follow up (Recommend intermittent supervision secondary to balance issues, and pt reprots he will try to stay with daughter at discharge)    Equipment Recommendations  Rolling walker with 5" wheels        Precautions / Restrictions Precautions Precautions: Fall Restrictions Weight Bearing Restrictions: No      Mobility  Bed Mobility Overal bed mobility: Modified Independent                Transfers Overall transfer level: Modified independent                  Ambulation/Gait Ambulation/Gait assistance: Supervision;Min guard Ambulation Distance (Feet): 150 Feet Assistive device: Rolling walker (2 wheeled);Straight cane Gait Pattern/deviations: Step-through pattern   Gait velocity interpretation: Below normal speed for age/gender General Gait Details: Gait attempted without AD and with std cane, though due to balance issues pt required use of RW for stable gait.    Modified Rankin (Stroke Patients Only) Modified Rankin (Stroke Patients Only) Pre-Morbid Rankin Score: No symptoms Modified Rankin: Slight disability     Balance Overall balance assessment: Needs assistance Sitting-balance support: Feet supported;No upper extremity supported Sitting balance-Leahy Scale: Good     Standing balance support: Bilateral upper extremity supported;During functional activity Standing balance-Leahy Scale: Fair                               Pertinent Vitals/Pain Pain Assessment: 0-10 Pain Score: 5  Pain Location: Headache Pain Descriptors / Indicators: Aching Pain Intervention(s): Limited activity within patient's tolerance;Premedicated before session;Repositioned    Home Living Family/patient expects to be discharged to:: Private residence Living Arrangements: Other relatives (Niece) Available Help at Discharge: Available  PRN/intermittently;Family (Pt reports niece is "unreliable", and will see if he can stay with daughter at discharge) Type of Home: House Home Access: Stairs to enter Entrance Stairs-Rails: None Entrance Stairs-Number of Steps: 1 Home Layout: One level Home Equipment: Grab bars - toilet;Grab bars - tub/shower      Prior Function  Level of Independence: Independent               Hand Dominance   Dominant Hand: Right    Extremity/Trunk Assessment   Upper Extremity Assessment: Defer to OT evaluation           Lower Extremity Assessment: Overall WFL for tasks assessed         Communication   Communication: No difficulties  Cognition Arousal/Alertness: Awake/alert Behavior During Therapy: WFL for tasks assessed/performed Overall Cognitive Status: Within Functional Limits for tasks assessed                       Assessment/Plan    PT Assessment Patient needs continued PT services  PT Diagnosis Abnormality of gait;Generalized weakness   PT Problem List Decreased balance;Decreased mobility  PT Treatment Interventions Balance training;Gait training;Stair training;Functional mobility training;Therapeutic activities;Therapeutic exercise;Patient/family education;Neuromuscular re-education   PT Goals (Current goals can be found in the Care Plan section) Acute Rehab PT Goals Patient Stated Goal: none stated PT Goal Formulation: With patient Time For Goal Achievement: 01/11/15 Potential to Achieve Goals: Good    Frequency Min 4X/week        Co-evaluation PT/OT/SLP Co-Evaluation/Treatment: Yes Reason for Co-Treatment: Complexity of the patient's impairments (multi-system involvement);For patient/therapist safety PT goals addressed during session: Mobility/safety with mobility;Balance;Strengthening/ROM OT goals addressed during session: ADL's and self-care;Strengthening/ROM       End of Session Equipment Utilized During Treatment: Gait belt Activity Tolerance: Patient tolerated treatment well Patient left: in chair;with call bell/phone within reach;with chair alarm set           Time: 808 [id number] 0799 PT Time Calculation (min) (ACUTE ONLY): 21 min   Charges:   PT Evaluation $Initial PT Evaluation Tier I: 1 Procedure     Lonna Cobb, DPT [phone]  01/04/2015, 9:41  AM

## 2015-01-04 NOTE — Progress Notes (Signed)
SLP Cancellation Note  Patient Details Name: Jason Walsh MRN: [id number] DOB: [birth year]   Cancelled treatment:       Reason Eval/Treat Not Completed: SLP screened, no needs identified, will sign off; Pt admitted for cerebellar stroke. He denies changes in thinking, memory, language comprehension, or verbal expression. He does report that it takes him a little bit longer to figure out what he wants to say, but this has gotten better. Pt states that his primary deficits are related to dizziness and balance. No acute SLP needs at this time.   Thank you,  Genene Churn, [place]    [place] 01/04/2015, 10:05 AM

## 2015-01-05 MED ORDER — PRAVASTATIN SODIUM 20 MG PO TABS: 20.0000 mg | ORAL_TABLET | Freq: Every day | ORAL | Status: DC

## 2015-01-05 MED ORDER — ASPIRIN 325 MG PO TABS: 325.0000 mg | ORAL_TABLET | Freq: Every day | ORAL | Status: DC

## 2015-01-05 MED ORDER — STROKE: EARLY STAGES OF RECOVERY BOOK
Freq: Once | Status: AC
  Administered 2015-01-05: 12:00:00
  Filled 2015-01-05: qty 1

## 2015-01-05 NOTE — Discharge Summary (Signed)
Physician Discharge Summary  Jason Walsh OAC:[id number] DOB: 09-11-1961 DOA: 01/03/2015  PCP: No primary care provider on file.  Admit date: 01/03/2015 Discharge date: 01/05/2015  Time spent: 40 minutes  Recommendations for Outpatient Follow-up:  1. Patient will follow up with neurology in 6 weeks 2. He's been set up with [facility] for [facility] care. 3. He will follow-up with cardiology in the outpatient setting for evaluation of PFO 4. Outpatient venous Doppler studies will be pursued.  Discharge Diagnoses:  Principal Problem:   Cerebellar infarct Active Problems:   Tobacco abuse   Hyperlipidemia   Cocaine abuse   Unsteady gait  PFO on echocardiogram  Discharge Condition: stable  Diet recommendation: low salt  Filed Weights   01/03/15 1323 01/03/15 1642 01/04/15 0500  Weight: 79.379 kg (175 lb) 79.833 kg (176 lb) 80.8 kg (178 lb 2.1 oz)    History of present illness:  This patient was admitted to the hospital with persistent dizziness and instability of gait. Heel transient blurry vision as well as transient slurring of his speech. His symptoms were occurring for approximately 2 days prior to admission. He presents to the ER when his symptoms persisted. Imaging indicated an acute cerebellar infarct. He was admitted to the hospital for further treatment.  Hospital Course:  Patient was followed by neurology during his hospital course. Recommendations were to start the patient on aspirin. Although MRI did show acute, large left PICA territory infarct with small amount of petechial hemorrhage, it was felt that the benefits outweighed the risks of starting aspirin. Carotid Dopplers were done which did not show any significant stenosis bilaterally. Echocardiogram was done which showed normal ejection fraction, but bubble study did indicate a PFO with intracardiac shunt. LDL was noted to be above goal and patient was started on a statin. Hemoglobin A1c was noted to be 5. He  was seen by physical and occupational therapy. Patient admitted to smoking. He was counseled extensively on the importance of tobacco cessation and was in agreement. His urine tox screen was also positive for cocaine. He verbalized understanding the risks of continuing illicit drug use and reports that he will no longer engage in such activities. Patient was cleared for discharge by neurology. He will follow up with neurology in 6 weeks.  Patient's echo results became available after his discharge. These results were discussed with neurology since the patient does have a PFO, it was recommended that he follow-up with cardiology. Arrangements have been made for him to see a cardiologist in the outpatient setting. We will also plan on venous Dopplers to be done. This will be arranged tomorrow and the patient will be contacted with an appointment.  Consultations:  Neurology  Discharge Exam: Filed Vitals:   01/05/15 1402  BP: 129/66  Pulse: 61  Temp: 97.9 F (36.6 C)  Resp: 18    General: NAD Cardiovascular: S1, S2 RRR Respiratory: CTA B  Discharge Instructions   Discharge Instructions    Call MD for:  persistant dizziness or light-headedness    Complete by:  As directed      Diet - low sodium heart healthy    Complete by:  As directed      Increase activity slowly    Complete by:  As directed           Discharge Medication List as of 01/05/2015  6:41 PM    START taking these medications   Details  aspirin 325 MG tablet Take 1 tablet (325 mg  total) by mouth daily., Starting 01/05/2015, Until Discontinued, Print    pravastatin (PRAVACHOL) 20 MG tablet Take 1 tablet (20 mg total) by mouth daily at 6 PM., Starting 01/05/2015, Until Discontinued, Normal      CONTINUE these medications which have NOT CHANGED   Details  EPINEPHrine (EPIPEN) 0.3 mg/0.3 mL IJ SOAJ injection Inject 0.3 mLs (0.3 mg total) into the muscle as needed., Starting 05/18/2014, Until Discontinued, Print     HYDROcodone-acetaminophen (NORCO/VICODIN) 5-325 MG per tablet Take 1-2 tablets by mouth every 4 (four) hours as needed for moderate pain or severe pain., Starting 06/21/2014, Until Discontinued, Print       No Known Allergies Follow-up Information    Follow up with [facility] On 01/11/2015.   Why:  10am   Contact information:   [place] [id number] [phone]       Follow up with [facility], KOFI, MD. Schedule an appointment as soon as possible for a visit in 6 weeks.   Specialty:  Neurology   Contact information:   [address] [place] [address] [phone]        The results of significant diagnostics from this hospitalization (including imaging, microbiology, ancillary and laboratory) are listed below for reference.    Significant Diagnostic Studies: Dg Chest 2 View  01/03/2015   CLINICAL DATA:  Difficulty breathing  EXAM: CHEST  2 VIEW  COMPARISON:  November 26, 2010  FINDINGS: There is an ill-defined opacity measuring 2.5 x 1.6 cm just superior to the anterior heart border on the lateral view. This finding is not seen on the frontal view. Lungs elsewhere are clear. Heart size and pulmonary vascularity are normal. No adenopathy. No bone lesions.  IMPRESSION: Somewhat ill-defined opacity just superior to the anterior heart border seen only on the lateral view. This finding does appear subtly nodular and warrants noncontrast enhanced chest CT to further assess.  Elsewhere lungs are clear.  Heart size within normal limits.   Electronically Signed   By: Lowella Grip M.D.   On: 01/03/2015 20:18   Ct Head Wo Contrast  01/03/2015   CLINICAL DATA:  Two week history of headache and dizziness  EXAM: CT HEAD WITHOUT CONTRAST  CT MAXILLOFACIAL WITHOUT CONTRAST  TECHNIQUE: Multidetector CT imaging of the head and maxillofacial structures were performed using the standard protocol without intravenous contrast. Multiplanar CT image reconstructions of  the maxillofacial structures were also generated.  COMPARISON:  June 21, 2014  FINDINGS: CT HEAD FINDINGS  The ventricles are normal in size and configuration. There is no intracranial mass, extra-axial fluid collection, or midline shift.  There is decreased attenuation in the left inferomedial cerebellum in the distribution of a portion of the left posterior inferior cerebellar artery. An acute infarct in this area is felt to be present. Cytotoxic edema from this infarct does not efface the fourth ventricle. On axial slice 5 series 2, there is a suspected small area of hemorrhage along the periphery of this infarct inferomedially. No other hemorrhage is appreciable on this study. The fourth ventricle appears unremarkable. Elsewhere, gray-white compartments appear normal. The bony calvarium appears intact. The mastoid air cells are clear.  CT MAXILLOFACIAL FINDINGS  There is no fracture or dislocation. The orbits appear symmetric and normal bilaterally. No intraorbital lesions are identified.  Paranasal sinuses are clear. Ostiomeatal unit complexes are patent bilaterally. There is mild rightward deviation of the nasal septum. There is middle and inferior nasal turbinate edema bilaterally. There is  nares narrowing but no nares obstruction. There is no demonstrable adenopathy. Salivary glands appear symmetric and normal bilaterally.  IMPRESSION: CT head: Recent/acute infarct in the left posterior and inferior, medial left cerebellum involving a portion of the left posterior inferior cerebellar artery distribution. On axial slice 5 series 2, there is an equivocal area of suspected hemorrhage along the inferomedial aspect of this infarct. No other hemorrhage. No mass seen. Study otherwise unremarkable.  CT maxillofacial: No fracture or dislocation. Paranasal sinuses are clear. Ostiomeatal unit complexes are patent. There is nasal turbinate edema bilaterally without nares obstruction. There is mild nasal septal  deviation. There is a benign appearing cystic area in the lateral aspect of the C3 vertebral body on the left.  Critical Value/emergent results were called by telephone at the time of interpretation on 01/03/2015 at 2:43 pm to Dr. Tanna Furry , who verbally acknowledged these results.   Electronically Signed   By: Lowella Grip M.D.   On: 01/03/2015 14:45   Mr Brain Wo Contrast  01/03/2015   CLINICAL DATA:  Stroke. Severe vertigo for 2 days with blurred vision, headache, vomiting, and nausea.  EXAM: MRI HEAD WITHOUT CONTRAST  MRA HEAD WITHOUT CONTRAST  TECHNIQUE: Multiplanar, multiecho pulse sequences of the brain and surrounding structures were obtained without intravenous contrast. Angiographic images of the head were obtained using MRA technique without contrast.  COMPARISON:  Head CT 01/03/2015  FINDINGS: MRI HEAD FINDINGS  There is a large, acute left PICA territory infarct involving the left cerebellum and inferior vermis. As suspected on CT, there is a small amount of associated petechial hemorrhage anteriorly in the inferior left cerebellum. There is associated cytotoxic edema with partial effacement of the fourth ventricle. There is no hydrocephalus. Cerebral volume is within normal limits for age. A few small foci of T2 hyperintensity in the subcortical cerebral white matter are nonspecific but may reflect minimal chronic small vessel ischemic disease. There is no mass, midline shift or extra-axial fluid collection.  Orbits are unremarkable. Paranasal sinuses and mastoid air cells are clear. Major intracranial vascular flow voids are preserved.  MRA HEAD FINDINGS  Visualized distal vertebral arteries are patent with the left being minimally larger than the right. Proximal PICAs are patent. The left PICA may be occluded more distally, however evaluation is limited by its small size at this level. Right AICA and bilateral SCA origins are patent. Basilar artery is patent without stenosis. There are  patent posterior communicating arteries bilaterally. PCAs are unremarkable.  Internal carotid arteries are patent from skullbase to carotid termini without stenosis. ACAs and MCAs are unremarkable. No intracranial aneurysm is identified.  IMPRESSION: 1. Acute, large left PICA territory infarct with small amount of petechial hemorrhage. Edema with partial effacement of the fourth ventricle. No hydrocephalus. 2. No major intracranial arterial occlusion or significant stenosis. Left PICA is patent proximally but may be occluded more distally.   Electronically Signed   By: [facility]   On: 01/03/2015 20:58   US Carotid Bilateral  01/04/2015   CLINICAL DATA:  Stroke  EXAM: BILATERAL CAROTID DUPLEX ULTRASOUND  TECHNIQUE: Pearline Cables scale imaging, color Doppler and duplex ultrasound were performed of bilateral carotid and vertebral arteries in the neck.  COMPARISON:  None.  FINDINGS: Criteria: Quantification of carotid stenosis is based on velocity parameters that correlate the residual internal carotid diameter with NASCET-based stenosis levels, using the diameter of the distal internal carotid lumen as the denominator for stenosis measurement.  The following velocity measurements were obtained:  RIGHT  ICA:  83 cm/sec  CCA:  76 cm/sec  SYSTOLIC ICA/CCA RATIO:  1.1  DIASTOLIC ICA/CCA RATIO:  ECA:  85 cm/sec  LEFT  ICA:  88 cm/sec  CCA:  78 cm/sec  SYSTOLIC ICA/CCA RATIO:  1.1  DIASTOLIC ICA/CCA RATIO:  ECA:  79 cm/sec  RIGHT CAROTID ARTERY: Minimal calcified plaque in the bulb. Low resistance internal carotid Doppler pattern.  RIGHT VERTEBRAL ARTERY:  Antegrade with a normal Doppler pattern.  LEFT CAROTID ARTERY: Minimal intimal thickening of the bulb. Low resistance internal carotid Doppler pattern.  LEFT VERTEBRAL ARTERY:  Antegrade with a normal Doppler pattern.  IMPRESSION: Less than 50% stenosis in the right and left internal carotid arteries.   Electronically Signed   By: Maryclare Bean M.D.   On: 01/04/2015 11:09   Mr  Jodene Nam Head/brain Wo Cm  01/03/2015   CLINICAL DATA:  Stroke. Severe vertigo for 2 days with blurred vision, headache, vomiting, and nausea.  EXAM: MRI HEAD WITHOUT CONTRAST  MRA HEAD WITHOUT CONTRAST  TECHNIQUE: Multiplanar, multiecho pulse sequences of the brain and surrounding structures were obtained without intravenous contrast. Angiographic images of the head were obtained using MRA technique without contrast.  COMPARISON:  Head CT 01/03/2015  FINDINGS: MRI HEAD FINDINGS  There is a large, acute left PICA territory infarct involving the left cerebellum and inferior vermis. As suspected on CT, there is a small amount of associated petechial hemorrhage anteriorly in the inferior left cerebellum. There is associated cytotoxic edema with partial effacement of the fourth ventricle. There is no hydrocephalus. Cerebral volume is within normal limits for age. A few small foci of T2 hyperintensity in the subcortical cerebral white matter are nonspecific but may reflect minimal chronic small vessel ischemic disease. There is no mass, midline shift or extra-axial fluid collection.  Orbits are unremarkable. Paranasal sinuses and mastoid air cells are clear. Major intracranial vascular flow voids are preserved.  MRA HEAD FINDINGS  Visualized distal vertebral arteries are patent with the left being minimally larger than the right. Proximal PICAs are patent. The left PICA may be occluded more distally, however evaluation is limited by its small size at this level. Right AICA and bilateral SCA origins are patent. Basilar artery is patent without stenosis. There are patent posterior communicating arteries bilaterally. PCAs are unremarkable.  Internal carotid arteries are patent from skullbase to carotid termini without stenosis. ACAs and MCAs are unremarkable. No intracranial aneurysm is identified.  IMPRESSION: 1. Acute, large left PICA territory infarct with small amount of petechial hemorrhage. Edema with partial effacement  of the fourth ventricle. No hydrocephalus. 2. No major intracranial arterial occlusion or significant stenosis. Left PICA is patent proximally but may be occluded more distally.   Electronically Signed   By: [facility]   On: 01/03/2015 20:58   Ct Maxillofacial Wo Cm  01/03/2015   CLINICAL DATA:  Two week history of headache and dizziness  EXAM: CT HEAD WITHOUT CONTRAST  CT MAXILLOFACIAL WITHOUT CONTRAST  TECHNIQUE: Multidetector CT imaging of the head and maxillofacial structures were performed using the standard protocol without intravenous contrast. Multiplanar CT image reconstructions of the maxillofacial structures were also generated.  COMPARISON:  June 21, 2014  FINDINGS: CT HEAD FINDINGS  The ventricles are normal in size and configuration. There is no intracranial mass, extra-axial fluid collection, or midline shift.  There is decreased attenuation in the left inferomedial cerebellum in the distribution of a portion of the left posterior inferior cerebellar artery. An acute infarct in  this area is felt to be present. Cytotoxic edema from this infarct does not efface the fourth ventricle. On axial slice 5 series 2, there is a suspected small area of hemorrhage along the periphery of this infarct inferomedially. No other hemorrhage is appreciable on this study. The fourth ventricle appears unremarkable. Elsewhere, gray-white compartments appear normal. The bony calvarium appears intact. The mastoid air cells are clear.  CT MAXILLOFACIAL FINDINGS  There is no fracture or dislocation. The orbits appear symmetric and normal bilaterally. No intraorbital lesions are identified.  Paranasal sinuses are clear. Ostiomeatal unit complexes are patent bilaterally. There is mild rightward deviation of the nasal septum. There is middle and inferior nasal turbinate edema bilaterally. There is nares narrowing but no nares obstruction. There is no demonstrable adenopathy. Salivary glands appear symmetric and normal  bilaterally.  IMPRESSION: CT head: Recent/acute infarct in the left posterior and inferior, medial left cerebellum involving a portion of the left posterior inferior cerebellar artery distribution. On axial slice 5 series 2, there is an equivocal area of suspected hemorrhage along the inferomedial aspect of this infarct. No other hemorrhage. No mass seen. Study otherwise unremarkable.  CT maxillofacial: No fracture or dislocation. Paranasal sinuses are clear. Ostiomeatal unit complexes are patent. There is nasal turbinate edema bilaterally without nares obstruction. There is mild nasal septal deviation. There is a benign appearing cystic area in the lateral aspect of the C3 vertebral body on the left.  Critical Value/emergent results were called by telephone at the time of interpretation on 01/03/2015 at 2:43 pm to Dr. Tanna Furry , who verbally acknowledged these results.   Electronically Signed   By: Lowella Grip M.D.   On: 01/03/2015 14:45    Microbiology: Recent Results (from the past 240 hour(s))  MRSA PCR Screening     Status: None   Collection Time: 01/03/15  9:42 PM  Result Value Ref Range Status   MRSA by PCR NEGATIVE NEGATIVE Final    Comment:        The GeneXpert MRSA Assay (FDA approved for NASAL specimens only), is one component of a comprehensive MRSA colonization surveillance program. It is not intended to diagnose MRSA infection nor to guide or monitor treatment for MRSA infections.      Labs: Basic Metabolic Panel:  Recent Labs Lab 01/03/15 1412  NA 139  K 3.8  CL 106  CO2 29  GLUCOSE 90  BUN 22  CREATININE 1.21  CALCIUM 9.0   Liver Function Tests: No results for input(s): AST, ALT, ALKPHOS, BILITOT, PROT, ALBUMIN in the last 168 hours. No results for input(s): LIPASE, AMYLASE in the last 168 hours. No results for input(s): AMMONIA in the last 168 hours. CBC:  Recent Labs Lab 01/03/15 1412  WBC 9.5  NEUTROABS 6.7  HGB 15.1  HCT 44.0  MCV 95.9   PLT 231   Cardiac Enzymes:  Recent Labs Lab 01/03/15 1412  TROPONINI <0.03   BNP: BNP (last 3 results) No results for input(s): PROBNP in the last 8760 hours. CBG: No results for input(s): GLUCAP in the last 168 hours.     Signed:  MEMON,JEHANZEB  [facility] 01/05/2015, 7:20 PM

## 2015-01-05 NOTE — Care Management Note (Addendum)
    Page 1 of 1   01/05/2015     3:08:52 PM CARE MANAGEMENT NOTE 01/05/2015  Patient:  Jason Walsh, Jason Walsh   Account Number:  [id number]  Date Initiated:  01/04/2015  Documentation initiated by:  CHILDRESS,JESSICA  Subjective/Objective Assessment:   Pt is from home, lives with neice. Pt has no PCP or insurance. Pt has no DME's or med needs prior to admission. Pt plans to dishcarge home with self care.     Action/Plan:   Pt will need walker per PT's recommendation. If pt can not find one from a family member he would like one to be gotten through [facility]. Beckie Salts, of [facility] has been notified and will obtain pt information from chart.   Anticipated DC Date:  01/07/2015   Anticipated DC Plan:  HOME/SELF CARE  In-house referral  Financial Counselor      DC Planning Services  CM consult      Choice offered to / List presented to:             Status of service:  Completed, signed off Medicare Important Message given?   (If response is "NO", the following Medicare IM given date fields will be blank) Date Medicare IM given:   Medicare IM given by:   Date Additional Medicare IM given:   Additional Medicare IM given by:    Discharge Disposition:  HOME/SELF CARE  Per UR Regulation:    If discussed at Long Length of Stay Meetings, dates discussed:    Comments:  01/05/15 [place], RN BSN CM Pt not given [facility] voucher as pt d/c on asa and statin drug on $4 list at [employer]. Pt made aware and verbalized understanding.  01/05/15 [place], RN BSN CM Pt discharged home today. Pt stated that he has a rolling walker for home use. Pt given [employer] voucher for medication assistance. No other CM needs noted.   01/04/2015 [place], RN, MSN, CM Pt made eligability at appointment at the [facility] clinic. Pt given information for [facility]. Will determin med assistance need closer to discharge. Will continue to follow for CM needs.

## 2015-01-06 ENCOUNTER — Ambulatory Visit (HOSPITAL_COMMUNITY)
Admission: RE | Admit: 2015-01-06 | Discharge: 2015-01-06 | Disposition: A | Discharge status: Home / Self Care | Payer: MEDICAID | Source: Ambulatory Visit | Attending: Internal Medicine | Admitting: Internal Medicine

## 2015-01-06 ENCOUNTER — Other Ambulatory Visit (HOSPITAL_COMMUNITY): Payer: Self-pay | Admitting: Internal Medicine

## 2015-01-06 DIAGNOSIS — I509 Heart failure, unspecified: Secondary | ICD-10-CM | POA: Insufficient documentation

## 2015-01-06 DIAGNOSIS — Z720 Tobacco use: Secondary | ICD-10-CM | POA: Insufficient documentation

## 2015-01-06 DIAGNOSIS — R609 Edema, unspecified: Secondary | ICD-10-CM | POA: Insufficient documentation

## 2015-01-06 DIAGNOSIS — R600 Localized edema: Secondary | ICD-10-CM

## 2015-01-23 ENCOUNTER — Encounter: Payer: Self-pay | Admitting: Cardiovascular Disease

## 2015-01-24 ENCOUNTER — Encounter: Payer: Self-pay | Admitting: Cardiovascular Disease

## 2015-01-24 ENCOUNTER — Ambulatory Visit (INDEPENDENT_AMBULATORY_CARE_PROVIDER_SITE_OTHER): Payer: Self-pay | Admitting: Cardiovascular Disease

## 2015-01-24 VITALS — BP 110/82 | HR 83 | Ht 69.0 in | Wt 190.0 lb

## 2015-01-24 DIAGNOSIS — I639 Cerebral infarction, unspecified: Secondary | ICD-10-CM

## 2015-01-24 NOTE — Patient Instructions (Signed)
Your physician recommends that you schedule a follow-up appointment in: only as needed    Stop using cocaine      Thank you for choosing [facility] !

## 2015-01-24 NOTE — Progress Notes (Signed)
Patient ID: Jason Walsh, male   DOB: 11/04/1961, 53 y.o.   MRN: [id number]     Cardiology Office Note   Date:  01/24/2015   ID:  Jason Walsh, DOB 01-05-1961, MRN [id number]  PCP:  No PCP Per Patient  Cardiologist:   Jenkins Rouge, MD   No chief complaint on file.     History of Present Illness: Jason Walsh is a 54 y.o. 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  No history of palpitations, chest pain , syncope or arrhythmia No history of PAF.  Currently not working lives with sister.  Compliant with meds since d/c   Study Conclusions  - Left ventricle: The cavity size was normal. Wall thickness was normal. Systolic function was normal. The estimated ejection fraction was in the range of 55% to 60%. Indeterminate diastolic function. Wall motion was normal; there were no regional wall motion abnormalities. - Aortic valve: Mildly calcified annulus. Trileaflet; mildly thickened leaflets. - Mitral valve: Mildly calcified annulus. Mildly thickened leaflets . - Atrial septum: Probable PFO by 2D and color Doppler imaging. Bubble study is positive for intracardiac shunt. - Technically adequate study.    Past Medical History  Diagnosis Date  . CHF (congestive heart failure)   . Cancer     skin    Past Surgical History  Procedure Laterality Date  . Nose surgery       Current Outpatient Prescriptions  Medication Sig Dispense Refill  . diazepam (VALIUM) 5 MG tablet Take 5 mg by mouth every 6 (six) hours as needed for anxiety.    . pravastatin (PRAVACHOL) 20 MG tablet Take 1 tablet (20 mg total) by mouth daily at 6 PM. 30 tablet 1  . aspirin 325 MG  tablet Take 1 tablet (325 mg total) by mouth daily. 30 tablet 1   No current facility-administered medications for this visit.    Allergies:   Review of patient's allergies indicates no known allergies.    Social History:  The patient  reports that he quit smoking about 3 weeks ago. His smoking use included Cigarettes. He started smoking about 46 years ago. He smoked 1.00 pack per day. He has never used smokeless tobacco. He reports that he does not drink alcohol or use illicit drugs.   Family History:  The patient's family history includes Alzheimer's disease in his father; Cancer in his mother; Parkinson's disease in his father.    ROS:  Please see the history of present illness.   Otherwise, review of systems are positive for none.   All other systems are reviewed and negative.    PHYSICAL EXAM: VS:  BP 110/82 mmHg  Pulse 83  Ht 5\' 9"  (1.753 m)  Wt 86.183 kg (190 lb)  BMI 28.05 kg/m2  SpO2 95% , BMI Body mass index is 28.05 kg/(m^2). GEN: Well nourished, well developed, in no acute distress HEENT: normal Neck: no JVD, carotid bruits, or masses Cardiac: RRR; no murmurs, rubs, or gallops,no edema  Respiratory:  clear to auscultation bilaterally, normal work of breathing GI: soft, nontender, nondistended, + BS MS: no deformity or atrophy Skin: warm and dry, no  rash Neuro:  Strength and sensation are intact Psych: euthymic mood, full affect   EKG:   NSR  Rate 54  Normal    Recent Labs: 01/03/2015: BUN 22; Creatinine 1.21; Hemoglobin 15.1; Platelets 231; Potassium 3.8; Sodium 139    Lipid Panel    Component Value Date/Time   CHOL 171 01/04/2015 0457   TRIG 216* 01/04/2015 0457   HDL 26* 01/04/2015 0457   CHOLHDL 6.6 01/04/2015 0457   VLDL 43* 01/04/2015 0457   LDLCALC 102* 01/04/2015 0457      Wt Readings from Last 3 Encounters:  01/24/15 86.183 kg (190 lb)  01/04/15 80.8 kg (178 lb 2.1 oz)  01/03/15 79.379 kg (175 lb)      Other studies Reviewed: Additional  studies/ records that were reviewed today include: Epic notes and notes [facility].    ASSESSMENT AND PLAN:  1.  PFO:  Small not significant no DVT on venous US  Stroke more likely from cocaine and smoking  If he has recurrent neurological event on Rx and not smoking Or doing cocaine can consider f/u TEE 2. Drug Use:   Declined UDS today discussed issues of cocaine and smoking 3. Moderate Left Carotid :  F/u duplex in a year ASA and statin 4. Smoking  F/u primary suggest CXR not done in hospital    Current medicines are reviewed at length with the patient today.  The patient has concerns regarding medicines.  The following changes have been made:  no change  Labs/ tests ordered today include: None  No orders of the defined types were placed in this encounter.     Disposition:   FU PRN   Signed, Jenkins Rouge, MD  01/24/2015 10:36 AM    [facility] [address], [place], [place]  [address] Phone: [phone]; Fax: [phone]

## 2015-04-25 ENCOUNTER — Encounter: Payer: Self-pay | Admitting: Cardiovascular Disease

## 2015-07-20 ENCOUNTER — Encounter (HOSPITAL_COMMUNITY): Payer: Self-pay | Admitting: *Deleted

## 2015-07-20 ENCOUNTER — Emergency Department (HOSPITAL_COMMUNITY): Payer: Self-pay

## 2015-07-20 ENCOUNTER — Emergency Department (HOSPITAL_COMMUNITY)
Admission: EM | Admit: 2015-07-20 | Discharge: 2015-07-20 | Disposition: A | Discharge status: Home / Self Care | Payer: Self-pay | Attending: Emergency Medicine | Admitting: Emergency Medicine

## 2015-07-20 DIAGNOSIS — Y998 Other external cause status: Secondary | ICD-10-CM | POA: Insufficient documentation

## 2015-07-20 DIAGNOSIS — G8929 Other chronic pain: Secondary | ICD-10-CM | POA: Insufficient documentation

## 2015-07-20 DIAGNOSIS — Z85828 Personal history of other malignant neoplasm of skin: Secondary | ICD-10-CM | POA: Insufficient documentation

## 2015-07-20 DIAGNOSIS — X58XXXA Exposure to other specified factors, initial encounter: Secondary | ICD-10-CM | POA: Insufficient documentation

## 2015-07-20 DIAGNOSIS — Y9289 Other specified places as the place of occurrence of the external cause: Secondary | ICD-10-CM | POA: Insufficient documentation

## 2015-07-20 DIAGNOSIS — S39012A Strain of muscle, fascia and tendon of lower back, initial encounter: Secondary | ICD-10-CM | POA: Insufficient documentation

## 2015-07-20 DIAGNOSIS — I509 Heart failure, unspecified: Secondary | ICD-10-CM | POA: Insufficient documentation

## 2015-07-20 DIAGNOSIS — Y9389 Activity, other specified: Secondary | ICD-10-CM | POA: Insufficient documentation

## 2015-07-20 DIAGNOSIS — Z720 Tobacco use: Secondary | ICD-10-CM | POA: Insufficient documentation

## 2015-07-20 DIAGNOSIS — Z8673 Personal history of transient ischemic attack (TIA), and cerebral infarction without residual deficits: Secondary | ICD-10-CM | POA: Insufficient documentation

## 2015-07-20 HISTORY — DX: Cerebral infarction, unspecified: I63.9

## 2015-07-20 HISTORY — DX: Unspecified thoracic, thoracolumbar and lumbosacral intervertebral disc disorder: M51.9

## 2015-07-20 MED ORDER — HYDROCODONE-ACETAMINOPHEN 5-325 MG PO TABS: 1.0000 | ORAL_TABLET | ORAL | Status: DC | PRN

## 2015-07-20 MED ORDER — IBUPROFEN 600 MG PO TABS: 600.0000 mg | ORAL_TABLET | Freq: Four times a day (QID) | ORAL | Status: DC | PRN

## 2015-07-20 MED ORDER — ONDANSETRON HCL 4 MG/2ML IJ SOLN
4.0000 mg | Freq: Once | INTRAMUSCULAR | Status: AC
  Administered 2015-07-20: 4 mg via INTRAMUSCULAR
  Filled 2015-07-20: qty 2

## 2015-07-20 MED ORDER — HYDROMORPHONE HCL 1 MG/ML IJ SOLN
1.0000 mg | Freq: Once | INTRAMUSCULAR | Status: AC
  Administered 2015-07-20: 1 mg via INTRAMUSCULAR
  Filled 2015-07-20: qty 1

## 2015-07-20 NOTE — ED Provider Notes (Signed)
CSN: [id number]     Arrival date & time 07/20/15  1045 History   First MD Initiated Contact with Patient 07/20/15 1111     No chief complaint on file.    (Consider location/radiation/quality/duration/timing/severity/associated sxs/prior Treatment) The history is provided by the patient.   Jason Walsh is a 54 y.o. male with a history of lumbar ddd and basal cell carcinoma of his face (resulting in major facial surgeries at [facility] over the past year) presenting with acute on chronic low back pain which has which has been worsened for the past 3 days. He states was being considered for back surgery when he was diagnosed with basal cell cancer on his face so his back problem was deferred.   Patient denies any new injury specifically.  There is radiation of pain into the left buttock.  There has been no weakness or numbness in the lower extremities and no urinary or bowel retention or incontinence.  Patient does not have a history of IVDU.  The patient has tried tylenol and aspirin without significant relief of symptoms.      Past Medical History  Diagnosis Date  . CHF (congestive heart failure)   . Cancer     skin  . Lumbar disc disorder   . Stroke    Past Surgical History  Procedure Laterality Date  . Nose surgery     Family History  Problem Relation Age of Onset  . Cancer Mother   . Parkinson's disease Father   . Alzheimer's disease Father    Social History  Substance Use Topics  . Smoking status: Current Every Day Smoker -- 1.00 packs/day for 40 years    Types: Cigarettes    Start date: 02/02/1969    Last Attempt to Quit: 01/01/2015  . Smokeless tobacco: Never Used  . Alcohol Use: 0.0 oz/week    0 Standard drinks or equivalent per week     Comment: occasional    Review of Systems  Constitutional: Negative for fever.  Respiratory: Negative for shortness of breath.   Cardiovascular: Negative for chest pain and leg swelling.  Gastrointestinal: Negative for abdominal  pain, constipation and abdominal distention.  Genitourinary: Negative for dysuria, urgency, frequency, flank pain and difficulty urinating.  Musculoskeletal: Positive for back pain. Negative for joint swelling and gait problem.  Skin: Negative for rash.  Neurological: Negative for weakness and numbness.      Allergies  Review of patient's allergies indicates no known allergies.  Home Medications   Prior to Admission medications   Medication Sig Start Date End Date Taking? Authorizing Provider  aspirin 325 MG tablet Take 1 tablet (325 mg total) by mouth daily. Patient not taking: Reported on 07/20/2015 01/05/15   Kathie Dike, MD  HYDROcodone-acetaminophen (NORCO/VICODIN) 5-325 MG per tablet Take 1 tablet by mouth every 4 (four) hours as needed. 07/20/15   Evalee Jefferson, PA-C  ibuprofen (ADVIL,MOTRIN) 600 MG tablet Take 1 tablet (600 mg total) by mouth every 6 (six) hours as needed. 07/20/15   Evalee Jefferson, PA-C  pravastatin (PRAVACHOL) 20 MG tablet Take 1 tablet (20 mg total) by mouth daily at 6 PM. Patient not taking: Reported on 07/20/2015 01/05/15   Kathie Dike, MD   BP 130/82 mmHg  Pulse 61  Temp(Src) 97.6 F (36.4 C) (Oral)  Resp 17  Ht 5\' 9"  (1.753 m)  Wt 195 lb (88.451 kg)  BMI 28.78 kg/m2  SpO2 98% Physical Exam  Constitutional: He appears well-developed and well-nourished.  HENT:  Head: Normocephalic.  Eyes: Conjunctivae are normal.  Neck: Normal range of motion. Neck supple.  Cardiovascular: Normal rate and intact distal pulses.   Pedal pulses normal.  Pulmonary/Chest: Effort normal.  Abdominal: Soft. Bowel sounds are normal. He exhibits no distension and no mass.  Musculoskeletal: Normal range of motion. He exhibits no edema.       Lumbar back: He exhibits tenderness. He exhibits no swelling, no edema and no spasm.  Left lower paralumbar ttp,  No midline pain.  Neurological: He is alert. He has normal strength. He displays no atrophy and no tremor. No sensory  deficit. Gait normal.  Reflex Scores:      Patellar reflexes are 2+ on the right side and 2+ on the left side.      Achilles reflexes are 2+ on the right side and 2+ on the left side. No strength deficit noted in hip and knee flexor and extensor muscle groups.  Ankle flexion and extension intact.  Skin: Skin is warm and dry.  Psychiatric: He has a normal mood and affect.  Nursing note and vitals reviewed.   ED Course  Procedures (including critical care time) Labs Review Labs Reviewed - No data to display  Imaging Review Dg Lumbar Spine Complete  07/20/2015   CLINICAL DATA:  Back pain.  Initial evaluation.  EXAM: LUMBAR SPINE - COMPLETE 4+ VIEW  COMPARISON:  None.  FINDINGS: Paraspinal soft tissues are normal. No acute bony abnormality identified. No evidence of fracture or dislocation. Degenerative changes lumbar spine. Normal alignment. Aortoiliac atherosclerotic vascular disease.  IMPRESSION: 1. Diffuse degenerative changes lumbar spine. No acute bony abnormality.  2.  Aortoiliac atherosclerotic vascular disease.   Electronically Signed   By: Marcello Moores  Register   On: 07/20/2015 13:11     EKG Interpretation None      MDM   Final diagnoses:  Lumbar strain, initial encounter    Patients labs and/or radiological studies were reviewed and considered during the medical decision making and disposition process.   Imaging was reviewed, interpreted and I agree with radiologists reading.  Results were also discussed with patient. No metastatic disease of lumbar spine.  Pt was given dilaudid 1 mg IM (family driving home). Pain greatly improved.  Placed on hydrocodone, ibuprofen.  States has had muscle relaxers in the past but not helpful.  Will defer.  Ice therapy, heat.  Referral for establishing pcp given.  PRn f/u here for any worsened sx which were discussed including weakness, urinary or fecal retention or incontinence.      Evalee Jefferson, PA-C 07/21/15 3220  Ezequiel Essex,  MD 07/21/15 [phone]

## 2015-07-20 NOTE — Discharge Instructions (Signed)
Back Pain, Adult °Low back pain is very common. About 1 in 5 people have back pain. The cause of low back pain is rarely dangerous. The pain often gets better over time. About half of people with a sudden onset of back pain feel better in just 2 weeks. About 8 in 10 people feel better by 6 weeks.  °CAUSES °Some common causes of back pain include: °· Strain of the muscles or ligaments supporting the spine. °· Wear and tear (degeneration) of the spinal discs. °· Arthritis. °· Direct injury to the back. °DIAGNOSIS °Most of the time, the direct cause of low back pain is not known. However, back pain can be treated effectively even when the exact cause of the pain is unknown. Answering your caregiver's questions about your overall health and symptoms is one of the most accurate ways to make sure the cause of your pain is not dangerous. If your caregiver needs more information, he or she may order lab work or imaging tests (X-rays or MRIs). However, even if imaging tests show changes in your back, this usually does not require surgery. °HOME CARE INSTRUCTIONS °For many people, back pain returns. Since low back pain is rarely dangerous, it is often a condition that people can learn to manage on their own.  °· Remain active. It is stressful on the back to sit or stand in one place. Do not sit, drive, or stand in one place for more than 30 minutes at a time. Take short walks on level surfaces as soon as pain allows. Try to increase the length of time you walk each day. °· Do not stay in bed. Resting more than 1 or 2 days can delay your recovery. °· Do not avoid exercise or work. Your body is made to move. It is not dangerous to be active, even though your back may hurt. Your back will likely heal faster if you return to being active before your pain is gone. °· Pay attention to your body when you  bend and lift. Many people have less discomfort when lifting if they bend their knees, keep the load close to their bodies, and  avoid twisting. Often, the most comfortable positions are those that put less stress on your recovering back. °· Find a comfortable position to sleep. Use a firm mattress and lie on your side with your knees slightly bent. If you lie on your back, put a pillow under your knees. °· Only take over-the-counter or prescription medicines as directed by your caregiver. Over-the-counter medicines to reduce pain and inflammation are often the most helpful. Your caregiver may prescribe muscle relaxant drugs. These medicines help dull your pain so you can more quickly return to your normal activities and healthy exercise. °· Put ice on the injured area. °¨ Put ice in a plastic bag. °¨ Place a towel between your skin and the bag. °¨ Leave the ice on for 15-20 minutes, 03-04 times a day for the first 2 to 3 days. After that, ice and heat may be alternated to reduce pain and spasms. °· Ask your caregiver about trying back exercises and gentle massage. This may be of some benefit. °· Avoid feeling anxious or stressed. Stress increases muscle tension and can worsen back pain. It is important to recognize when you are anxious or stressed and learn ways to manage it. Exercise is a great option. °SEEK MEDICAL CARE IF: °· You have pain that is not relieved with rest or medicine. °· You have pain that does not improve in 1 week. °· You have new symptoms. °· You are generally not feeling well. °SEEK   IMMEDIATE MEDICAL CARE IF:   You have pain that radiates from your back into your legs.  You develop new bowel or bladder control problems.  You have unusual weakness or numbness in your arms or legs.  You develop nausea or vomiting.  You develop abdominal pain.  You feel faint. Document Released: 11/25/2005 Document Revised: 05/26/2012 Document Reviewed: 03/29/2014 [facility] Patient Information 2015 [place], [place]. This information is not intended to replace advice given to you by your health care provider. Make sure you  discuss any questions you have with your health care provider.     Do not drive within 4 hours of taking hydrocodone as this will make you drowsy.  Avoid lifting,  Bending,  Twisting or any other activity that worsens your pain over the next week.  Apply an  icepack  to your lower back for 10-15 minutes every 2 hours for the next 2 days.  You should get rechecked if your symptoms are not better over the next 5 days,  Or you develop increased pain,  Weakness in your leg(s) or loss of bladder or bowel function - these are symptoms of a worse injury.

## 2015-07-20 NOTE — ED Notes (Addendum)
Pt c/o lower back pain x 3 days. Pt reports hx "fragmented disc problems" but denies any recent injury. Denies urination and bowel trouble. Reports trouble with ambulation and moving from sitting to standing position.

## 2015-07-20 NOTE — ED Notes (Signed)
Julie-PA at bedside at this time for evaluation. 

## 2015-09-09 ENCOUNTER — Encounter (HOSPITAL_COMMUNITY): Payer: Self-pay | Admitting: *Deleted

## 2015-09-09 ENCOUNTER — Emergency Department (HOSPITAL_COMMUNITY)
Admission: EM | Admit: 2015-09-09 | Discharge: 2015-09-09 | Disposition: A | Discharge status: Home / Self Care | Payer: Self-pay | Attending: Emergency Medicine | Admitting: Emergency Medicine

## 2015-09-09 DIAGNOSIS — Z7982 Long term (current) use of aspirin: Secondary | ICD-10-CM | POA: Insufficient documentation

## 2015-09-09 DIAGNOSIS — G8929 Other chronic pain: Secondary | ICD-10-CM | POA: Insufficient documentation

## 2015-09-09 DIAGNOSIS — M5432 Sciatica, left side: Secondary | ICD-10-CM | POA: Insufficient documentation

## 2015-09-09 DIAGNOSIS — Z720 Tobacco use: Secondary | ICD-10-CM | POA: Insufficient documentation

## 2015-09-09 DIAGNOSIS — Z85828 Personal history of other malignant neoplasm of skin: Secondary | ICD-10-CM | POA: Insufficient documentation

## 2015-09-09 DIAGNOSIS — Z8673 Personal history of transient ischemic attack (TIA), and cerebral infarction without residual deficits: Secondary | ICD-10-CM | POA: Insufficient documentation

## 2015-09-09 DIAGNOSIS — I509 Heart failure, unspecified: Secondary | ICD-10-CM | POA: Insufficient documentation

## 2015-09-09 MED ORDER — CYCLOBENZAPRINE HCL 10 MG PO TABS: 10.0000 mg | ORAL_TABLET | Freq: Three times a day (TID) | ORAL | Status: DC | PRN

## 2015-09-09 MED ORDER — PREDNISONE 10 MG PO TABS: ORAL_TABLET | ORAL | Status: DC

## 2015-09-09 MED ORDER — CYCLOBENZAPRINE HCL 10 MG PO TABS
10.0000 mg | ORAL_TABLET | Freq: Once | ORAL | Status: AC
  Administered 2015-09-09: 10 mg via ORAL
  Filled 2015-09-09: qty 1

## 2015-09-09 MED ORDER — HYDROCODONE-ACETAMINOPHEN 5-325 MG PO TABS: ORAL_TABLET | ORAL | Status: DC

## 2015-09-09 MED ORDER — HYDROCODONE-ACETAMINOPHEN 5-325 MG PO TABS
2.0000 | ORAL_TABLET | Freq: Once | ORAL | Status: AC
  Administered 2015-09-09: 2 via ORAL
  Filled 2015-09-09: qty 2

## 2015-09-09 NOTE — ED Provider Notes (Signed)
CSN: [id number]     Arrival date & time 09/09/15  1813 History   First MD Initiated Contact with Patient 09/09/15 1822     Chief Complaint  Patient presents with  . Back Pain     (Consider location/radiation/quality/duration/timing/severity/associated sxs/prior Treatment) HPI   Jason Walsh is a 54 y.o. male who presents to the Emergency Department complaining of worsening low back pain for 5-6 weeks.  He states that he was seen here in August for same and pain has not really improved.  He states the pain became worse after a sneeze earlier.  He describes a sharp pain to his left back that radiates into his left upper leg to the knee.  Pain is worse with weight bearing and bending.  He denies fall, numbness or weakness of the LE's, abd pain, urine or bowel changes.  He has tried OTC analgesics without relief.    Past Medical History  Diagnosis Date  . CHF (congestive heart failure) ([place])   . Cancer ([place])     skin  . Lumbar disc disorder   . Stroke [facility])    Past Surgical History  Procedure Laterality Date  . Nose surgery     Family History  Problem Relation Age of Onset  . Cancer Mother   . Parkinson's disease Father   . Alzheimer's disease Father    Social History  Substance Use Topics  . Smoking status: Current Every Day Smoker -- 1.00 packs/day for 40 years    Types: Cigarettes    Start date: 02/02/1969    Last Attempt to Quit: 01/01/2015  . Smokeless tobacco: Never Used  . Alcohol Use: 0.0 oz/week    0 Standard drinks or equivalent per week     Comment: occasional    Review of Systems  Constitutional: Negative for fever.  Respiratory: Negative for shortness of breath.   Gastrointestinal: Negative for vomiting, abdominal pain and constipation.  Genitourinary: Negative for dysuria, hematuria, flank pain, decreased urine volume and difficulty urinating.  Musculoskeletal: Positive for back pain. Negative for joint swelling.  Skin: Negative for rash.  Neurological:  Negative for weakness and numbness.  All other systems reviewed and are negative.     Allergies  Review of patient's allergies indicates no known allergies.  Home Medications   Prior to Admission medications   Medication Sig Start Date End Date Taking? Authorizing Provider  aspirin 325 MG tablet Take 1 tablet (325 mg total) by mouth daily. Patient not taking: Reported on 07/20/2015 01/05/15   Kathie Dike, MD  HYDROcodone-acetaminophen (NORCO/VICODIN) 5-325 MG per tablet Take 1 tablet by mouth every 4 (four) hours as needed. 07/20/15   Evalee Jefferson, PA-C  ibuprofen (ADVIL,MOTRIN) 600 MG tablet Take 1 tablet (600 mg total) by mouth every 6 (six) hours as needed. 07/20/15   Evalee Jefferson, PA-C  pravastatin (PRAVACHOL) 20 MG tablet Take 1 tablet (20 mg total) by mouth daily at 6 PM. Patient not taking: Reported on 07/20/2015 01/05/15   Kathie Dike, MD   BP 136/67 mmHg  Pulse 74  Temp(Src) 97.8 F (36.6 C) (Oral)  Resp 16  Ht 5\' 8"  (1.727 m)  Wt 195 lb (88.451 kg)  BMI 29.66 kg/m2  SpO2 99% Physical Exam  Constitutional: He is oriented to person, place, and time. He appears well-developed and well-nourished. No distress.  HENT:  Head: Normocephalic and atraumatic.  Neck: Normal range of motion. Neck supple.  Cardiovascular: Normal rate, regular rhythm, normal heart sounds and intact distal pulses.  No murmur heard. Pulmonary/Chest: Effort normal and breath sounds normal. No respiratory distress.  Abdominal: Soft. He exhibits no distension. There is no tenderness. There is no rebound and no guarding.  Musculoskeletal: He exhibits tenderness. He exhibits no edema.       Lumbar back: He exhibits tenderness and pain. He exhibits normal range of motion, no swelling, no deformity, no laceration and normal pulse.  ttp of the left lumbar spine and paraspinal muscles.   DP pulses are brisk and symmetrical.  Distal sensation intact.  Hip Flexors/Extensors are intact.  Pt has 5/5 strength against  resistance of bilateral lower extremities.     Neurological: He is alert and oriented to person, place, and time. He has normal strength. No sensory deficit. He exhibits normal muscle tone. Coordination and gait normal.  Reflex Scores:      Patellar reflexes are 2+ on the right side and 2+ on the left side.      Achilles reflexes are 2+ on the right side and 2+ on the left side. Skin: Skin is warm and dry. No rash noted.  Nursing note and vitals reviewed.   ED Course  Procedures (including critical care time) Labs Review Labs Reviewed - No data to display  Imaging Review No results found. I have personally reviewed and evaluated these images and lab results as part of my medical decision-making.   EKG Interpretation None       LS-spine film from 07/20/2015 showed diffuse degenerative changes of the lumbar spine  MDM   Final diagnoses:  Sciatica neuralgia, left    Pt reviewed on the [place] narcotic database.  No rx filed since 07/20/15  Pt with acute on chronic low back pain.  No focal neuro deficits, no concerning sx's for emergent neurological process.  Pt ambulates with a slightly antalgic gait.  Pt given referral info for [facility] and advised that he needs to try to establish primary care. He is well appearing and stable for d/c   Kem Parkinson, PA-C 09/10/15 [place], MD 09/10/15 2051

## 2015-09-09 NOTE — ED Notes (Signed)
Pt states he is having lower back pain for the past 5 weeks. Pt has been seen here for same and his pain is worse today. NAD noted.

## 2015-09-09 NOTE — Discharge Instructions (Signed)
Sciatica °Sciatica is pain, weakness, numbness, or tingling along your sciatic nerve. The nerve starts in the lower back and runs down the back of each leg. Nerve damage or certain conditions pinch or put pressure on the sciatic nerve. This causes the pain, weakness, and other discomforts of sciatica. °HOME CARE  °· Only take medicine as told by your doctor. °· Apply ice to the affected area for 20 minutes. Do this 3-4 times a day for the first 48-72 hours. Then try heat in the same way. °· Exercise, stretch, or do your usual activities if these do not make your pain worse. °· Go to physical therapy as told by your doctor. °· Keep all doctor visits as told. °· Do not wear high heels or shoes that are not supportive. °· Get a firm mattress if your mattress is too soft to lessen pain and discomfort. °GET HELP RIGHT AWAY IF:  °· You cannot control when you poop (bowel movement) or pee (urinate). °· You have more weakness in your lower back, lower belly (pelvis), butt (buttocks), or legs. °· You have redness or puffiness (swelling) of your back. °· You have a burning feeling when you pee. °· You have pain that gets worse when you lie down. °· You have pain that wakes you from your sleep. °· Your pain is worse than past pain. °· Your pain lasts longer than 4 weeks. °· You are suddenly losing weight without reason. °MAKE SURE YOU:  °· Understand these instructions. °· Will watch this condition. °· Will get help right away if you are not doing well or get worse. °Document Released: 09/03/2008 Document Revised: 05/26/2012 Document Reviewed: 04/05/2012 °[employer]® Patient Information ©2015 [employer]. This information is not intended to replace advice given to you by your health care provider. Make sure you discuss any questions you have with your health care provider. ° °
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
## 2015-12-28 ENCOUNTER — Emergency Department (HOSPITAL_COMMUNITY)
Admission: EM | Admit: 2015-12-28 | Discharge: 2015-12-28 | Disposition: A | Discharge status: Home / Self Care | Payer: Self-pay | Attending: Emergency Medicine | Admitting: Emergency Medicine

## 2015-12-28 ENCOUNTER — Encounter (HOSPITAL_COMMUNITY): Payer: Self-pay | Admitting: Emergency Medicine

## 2015-12-28 ENCOUNTER — Emergency Department (HOSPITAL_COMMUNITY): Payer: Self-pay

## 2015-12-28 DIAGNOSIS — F17210 Nicotine dependence, cigarettes, uncomplicated: Secondary | ICD-10-CM | POA: Insufficient documentation

## 2015-12-28 DIAGNOSIS — I509 Heart failure, unspecified: Secondary | ICD-10-CM | POA: Insufficient documentation

## 2015-12-28 DIAGNOSIS — Z7952 Long term (current) use of systemic steroids: Secondary | ICD-10-CM | POA: Insufficient documentation

## 2015-12-28 DIAGNOSIS — Z85828 Personal history of other malignant neoplasm of skin: Secondary | ICD-10-CM | POA: Insufficient documentation

## 2015-12-28 DIAGNOSIS — Z8673 Personal history of transient ischemic attack (TIA), and cerebral infarction without residual deficits: Secondary | ICD-10-CM | POA: Insufficient documentation

## 2015-12-28 DIAGNOSIS — M5432 Sciatica, left side: Secondary | ICD-10-CM | POA: Insufficient documentation

## 2015-12-28 MED ORDER — OXYCODONE-ACETAMINOPHEN 5-325 MG PO TABS
1.0000 | ORAL_TABLET | Freq: Once | ORAL | Status: AC
  Administered 2015-12-28: 1 via ORAL
  Filled 2015-12-28: qty 1

## 2015-12-28 MED ORDER — HYDROCODONE-ACETAMINOPHEN 5-325 MG PO TABS: 1.0000 | ORAL_TABLET | Freq: Four times a day (QID) | ORAL | Status: DC | PRN

## 2015-12-28 MED ORDER — NAPROXEN 500 MG PO TABS: 500.0000 mg | ORAL_TABLET | Freq: Two times a day (BID) | ORAL | Status: DC

## 2015-12-28 NOTE — Discharge Instructions (Signed)
Follow-up with Dr. Harrison if not improving °

## 2015-12-28 NOTE — ED Notes (Signed)
MD Zammit at bedside updating patient.

## 2015-12-28 NOTE — ED Notes (Signed)
Pt states that he has vertigo and was dizzy yesterday and kept stumbling into things.  States that his dizziness is better today but his left hip is hurting from stumbling so much.  Denies actual fall.  Also c/o right rib pain.

## 2015-12-28 NOTE — ED Notes (Signed)
MD Zammit at bedside. 

## 2015-12-28 NOTE — ED Provider Notes (Signed)
CSN: [id number]     Arrival date & time 12/28/15  1402 History   First MD Initiated Contact with Patient 12/28/15 1504     Chief Complaint  Patient presents with  . Hip Pain     (Consider location/radiation/quality/duration/timing/severity/associated sxs/prior Treatment) Patient is a 55 y.o. male presenting with hip pain. The history is provided by the patient (Patient complains of pain in his left buttocks some pain in his left thigh. Assessment: On for over week).  Hip Pain This is a new problem. The current episode started more than 2 days ago. The problem occurs constantly. The problem has not changed since onset.Pertinent negatives include no chest pain, no abdominal pain and no headaches. Exacerbated by: Movement. Nothing relieves the symptoms.    Past Medical History  Diagnosis Date  . CHF (congestive heart failure) ([place])   . Cancer ([place])     skin  . Lumbar disc disorder   . Stroke [facility])    Past Surgical History  Procedure Laterality Date  . Nose surgery     Family History  Problem Relation Age of Onset  . Cancer Mother   . Parkinson's disease Father   . Alzheimer's disease Father    Social History  Substance Use Topics  . Smoking status: Current Every Day Smoker -- 1.00 packs/day for 40 years    Types: Cigarettes    Start date: 02/02/1969  . Smokeless tobacco: Never Used  . Alcohol Use: 0.0 oz/week    0 Standard drinks or equivalent per week     Comment: occasional    Review of Systems  Constitutional: Negative for appetite change and fatigue.  HENT: Negative for congestion, ear discharge and sinus pressure.   Eyes: Negative for discharge.  Respiratory: Negative for cough.   Cardiovascular: Negative for chest pain.  Gastrointestinal: Negative for abdominal pain and diarrhea.  Genitourinary: Negative for frequency and hematuria.  Musculoskeletal: Negative for back pain.       Left hip pain  Skin: Negative for rash.  Neurological: Negative for seizures and  headaches.  Psychiatric/Behavioral: Negative for hallucinations.      Allergies  Review of patient's allergies indicates no known allergies.  Home Medications   Prior to Admission medications   Medication Sig Start Date End Date Taking? Authorizing Provider  cyclobenzaprine (FLEXERIL) 10 MG tablet Take 1 tablet (10 mg total) by mouth 3 (three) times daily as needed. 09/09/15   Tammy Triplett, PA-C  HYDROcodone-acetaminophen (NORCO/VICODIN) 5-325 MG tablet Take 1 tablet by mouth every 6 (six) hours as needed. 12/28/15   Milton Ferguson, MD  naproxen (NAPROSYN) 500 MG tablet Take 1 tablet (500 mg total) by mouth 2 (two) times daily. 12/28/15   Milton Ferguson, MD  predniSONE (DELTASONE) 10 MG tablet Take 6 tablets day one, 5 tablets day two, 4 tablets day three, 3 tablets day four, 2 tablets day five, then 1 tablet day six 09/09/15   Tammy Triplett, PA-C   BP 131/88 mmHg  Pulse 77  Temp(Src) 98.5 F (36.9 C) (Temporal)  Resp 22  Ht 5\' 10"  (1.778 m)  Wt 200 lb (90.719 kg)  BMI 28.70 kg/m2  SpO2 97% Physical Exam  Constitutional: He is oriented to person, place, and time. He appears well-developed.  HENT:  Head: Normocephalic.  Eyes: Conjunctivae and EOM are normal. No scleral icterus.  Neck: Neck supple. No thyromegaly present.  Cardiovascular: Normal rate and regular rhythm.  Exam reveals no gallop and no friction rub.   No murmur heard. Pulmonary/Chest:  No stridor. He has no wheezes. He has no rales. He exhibits no tenderness.  Abdominal: He exhibits no distension. There is no tenderness. There is no rebound.  Musculoskeletal: Normal range of motion. He exhibits tenderness. He exhibits no edema.  Tender left thigh and left sacro-iliac joint  Lymphadenopathy:    He has no cervical adenopathy.  Neurological: He is oriented to person, place, and time. He exhibits normal muscle tone. Coordination normal.  Skin: No rash noted. No erythema.  Psychiatric: He has a normal mood and affect.  His behavior is normal.    ED Course  Procedures (including critical care time) Labs Review Labs Reviewed - No data to display  Imaging Review Dg Lumbar Spine Complete  12/28/2015  CLINICAL DATA:  Left hip pain.  Stumbling.  Low back pain. EXAM: LUMBAR SPINE - COMPLETE 4+ VIEW COMPARISON:  07/20/2015 FINDINGS: Suspected left nephrolithiasis. Punctate calcification measuring 3 mm projecting over the right psoas muscle, possibly vascular but cannot exclude a ureteral calculus. Stable scalloping of the superior endplate of L4 likely from a broad Schmorl 's node. Mild lumbar spondylosis without significant loss of disc height. Facet arthropathy at L4-5 and L5-S 1. No pars defects or fracture observed. IMPRESSION: 1. Lower lumbar spondylosis. 2. Suspected left nephrolithiasis. Faint calcification projects over the right psoas muscle and could be within the right ureter, or could be vascular. Electronically Signed   By: Van Clines M.D.   On: 12/28/2015 15:58   Dg Hip Unilat With Pelvis 2-3 Views Left  12/28/2015  CLINICAL DATA:  Left hip pain, stumbling. EXAM: DG HIP (WITH OR WITHOUT PELVIS) 2-3V LEFT COMPARISON:  None. FINDINGS: There is no evidence of hip fracture or dislocation. There is no evidence of arthropathy or other focal bone abnormality. IMPRESSION: Negative. Electronically Signed   By: Van Clines M.D.   On: 12/28/2015 15:56   I have personally reviewed and evaluated these images and lab results as part of my medical decision-making.   EKG Interpretation None      MDM   Final diagnoses:  Sciatica of left side    X-rays unremarkable,      diagnosis sciatica will treat with Naprosyn and Vicodin    Milton Ferguson, MD 12/28/15 1732

## 2016-06-13 ENCOUNTER — Emergency Department (HOSPITAL_COMMUNITY)
Admission: EM | Admit: 2016-06-13 | Discharge: 2016-06-13 | Disposition: A | Discharge status: Home / Self Care | Payer: Self-pay | Attending: Emergency Medicine | Admitting: Emergency Medicine

## 2016-06-13 ENCOUNTER — Emergency Department (HOSPITAL_COMMUNITY): Payer: Self-pay

## 2016-06-13 ENCOUNTER — Encounter (HOSPITAL_COMMUNITY): Payer: Self-pay | Admitting: Emergency Medicine

## 2016-06-13 DIAGNOSIS — F17210 Nicotine dependence, cigarettes, uncomplicated: Secondary | ICD-10-CM | POA: Insufficient documentation

## 2016-06-13 DIAGNOSIS — H2000 Unspecified acute and subacute iridocyclitis: Secondary | ICD-10-CM

## 2016-06-13 DIAGNOSIS — M791 Myalgia: Secondary | ICD-10-CM | POA: Insufficient documentation

## 2016-06-13 DIAGNOSIS — I509 Heart failure, unspecified: Secondary | ICD-10-CM | POA: Insufficient documentation

## 2016-06-13 DIAGNOSIS — J705 Respiratory conditions due to smoke inhalation: Secondary | ICD-10-CM | POA: Insufficient documentation

## 2016-06-13 DIAGNOSIS — T1490XA Injury, unspecified, initial encounter: Secondary | ICD-10-CM

## 2016-06-13 DIAGNOSIS — H20013 Primary iridocyclitis, bilateral: Secondary | ICD-10-CM | POA: Insufficient documentation

## 2016-06-13 DIAGNOSIS — Z85828 Personal history of other malignant neoplasm of skin: Secondary | ICD-10-CM | POA: Insufficient documentation

## 2016-06-13 DIAGNOSIS — J9801 Acute bronchospasm: Secondary | ICD-10-CM | POA: Insufficient documentation

## 2016-06-13 DIAGNOSIS — Z8673 Personal history of transient ischemic attack (TIA), and cerebral infarction without residual deficits: Secondary | ICD-10-CM | POA: Insufficient documentation

## 2016-06-13 LAB — COMPREHENSIVE METABOLIC PANEL
ALT: 20 U/L (ref 17–63)
AST: 17 U/L (ref 15–41)
Albumin: 3.7 g/dL (ref 3.5–5.0)
Alkaline Phosphatase: 82 U/L (ref 38–126)
Anion gap: 6 (ref 5–15)
BUN: 18 mg/dL (ref 6–20)
CO2: 27 mmol/L (ref 22–32)
CREATININE: 1.1 mg/dL (ref 0.61–1.24)
Calcium: 8.5 mg/dL — ABNORMAL LOW (ref 8.9–10.3)
Chloride: 107 mmol/L (ref 101–111)
GFR calc Af Amer: >60 mL/min (ref >60)
GFR calc non Af Amer: >60 mL/min (ref >60)
GLUCOSE: 113 mg/dL — AB (ref 65–99)
Potassium: 3.2 mmol/L — ABNORMAL LOW (ref 3.5–5.1)
SODIUM: 140 mmol/L (ref 135–145)
Total Bilirubin: 0.5 mg/dL (ref 0.3–1.2)
Total Protein: 6.1 g/dL — ABNORMAL LOW (ref 6.5–8.1)

## 2016-06-13 LAB — CBC WITH DIFFERENTIAL/PLATELET
BASOS ABS: 0 10*3/uL (ref 0.0–0.1)
Basophils Relative: 0 %
EOS ABS: 0.1 10*3/uL (ref 0.0–0.7)
EOS PCT: 1 %
HCT: 38.5 % — ABNORMAL LOW (ref 39.0–52.0)
HEMOGLOBIN: 13.3 g/dL (ref 13.0–17.0)
LYMPHS PCT: 22 %
Lymphs Abs: 2.2 10*3/uL (ref 0.7–4.0)
MCH: 34 pg (ref 26.0–34.0)
MCHC: 34.5 g/dL (ref 30.0–36.0)
MCV: 98.5 fL (ref 78.0–100.0)
Monocytes Absolute: 0.6 10*3/uL (ref 0.1–1.0)
Monocytes Relative: 6 %
NEUTROS PCT: 71 %
Neutro Abs: 7.4 10*3/uL (ref 1.7–7.7)
Platelets: 219 10*3/uL (ref 150–400)
RBC: 3.91 MIL/uL — AB (ref 4.22–5.81)
RDW: 12.3 % (ref 11.5–15.5)
WBC: 10.4 10*3/uL (ref 4.0–10.5)

## 2016-06-13 LAB — RAPID STREP SCREEN (MED CTR MEBANE ONLY): Streptococcus, Group A Screen (Direct): NEGATIVE

## 2016-06-13 MED ORDER — PREDNISONE 20 MG PO TABS: ORAL_TABLET | ORAL | Status: DC

## 2016-06-13 MED ORDER — PREDNISOLONE ACETATE 1 % OP SUSP
2.0000 [drp] | Freq: Once | OPHTHALMIC | Status: AC
  Administered 2016-06-13: 2 [drp] via OPHTHALMIC
  Filled 2016-06-13: qty 1

## 2016-06-13 MED ORDER — TETRACAINE HCL 0.5 % OP SOLN
2.0000 [drp] | Freq: Once | OPHTHALMIC | Status: AC
  Administered 2016-06-13: 2 [drp] via OPHTHALMIC
  Filled 2016-06-13: qty 4

## 2016-06-13 MED ORDER — ALBUTEROL SULFATE HFA 108 (90 BASE) MCG/ACT IN AERS
1.0000 | INHALATION_SPRAY | RESPIRATORY_TRACT | Status: DC | PRN
  Filled 2016-06-13: qty 6.7

## 2016-06-13 MED ORDER — CYCLOPENTOLATE HCL 1 % OP SOLN
2.0000 [drp] | Freq: Once | OPHTHALMIC | Status: DC
Start: 2016-06-13 — End: 2016-06-14
  Filled 2016-06-13: qty 2

## 2016-06-13 MED ORDER — TETRACAINE HCL 0.5 % OP SOLN
OPHTHALMIC | Status: AC
  Filled 2016-06-13: qty 4

## 2016-06-13 MED ORDER — LORATADINE 10 MG PO TABS: 10.0000 mg | ORAL_TABLET | Freq: Every day | ORAL | Status: DC

## 2016-06-13 MED ORDER — LORATADINE 10 MG PO TABS
10.0000 mg | ORAL_TABLET | Freq: Once | ORAL | Status: AC
  Administered 2016-06-13: 10 mg via ORAL
  Filled 2016-06-13: qty 1

## 2016-06-13 MED ORDER — CYCLOPENTOLATE HCL 1 % OP SOLN: 2.0000 [drp] | Freq: Every day | OPHTHALMIC | Status: DC | PRN

## 2016-06-13 MED ORDER — PREDNISOLONE ACETATE 1 % OP SUSP: 2.0000 [drp] | Freq: Two times a day (BID) | OPHTHALMIC | Status: DC

## 2016-06-13 MED ORDER — FLUORESCEIN SODIUM 1 MG OP STRP
ORAL_STRIP | OPHTHALMIC | Status: AC
  Filled 2016-06-13: qty 1

## 2016-06-13 MED ORDER — PREDNISONE 10 MG PO TABS
60.0000 mg | ORAL_TABLET | Freq: Once | ORAL | Status: AC
Start: 2016-06-13 — End: 2016-06-13
  Administered 2016-06-13: 60 mg via ORAL
  Filled 2016-06-13: qty 1

## 2016-06-13 MED ORDER — BENZONATATE 100 MG PO CAPS: 100.0000 mg | ORAL_CAPSULE | Freq: Three times a day (TID) | ORAL | Status: DC | PRN

## 2016-06-13 MED ORDER — ALBUTEROL SULFATE (2.5 MG/3ML) 0.083% IN NEBU
5.0000 mg | INHALATION_SOLUTION | Freq: Once | RESPIRATORY_TRACT | Status: AC
  Administered 2016-06-13: 5 mg via RESPIRATORY_TRACT
  Filled 2016-06-13: qty 6

## 2016-06-13 NOTE — ED Notes (Signed)
Visual acuity screening was completed with corrective lenses on!

## 2016-06-13 NOTE — Discharge Instructions (Signed)
Uveitis Uveitis is swelling and irritation (inflammation) in the eye. It often affects the middle part of the eye (uvea). This area contains many of the blood vessels that supply the rest of the eye. The uvea is made up of three structures:  The middle layer of the eyeball (choroid).  The colored part of the front of the eye (iris).  The connection between the iris and the choroid (ciliary body). Uveitis can affect any part of the uvea as well as other important structures in the eye. There are many types of uveitis:  Iritis, or anterior uveitis, affects the iris. This is the most common type. It can start suddenly and can last for many weeks.  Intermediate uveitis affects the structures in the middle of the eye. This includes the fluid that fills the eye (vitreous). This type can last for years and can come and go.  Posterior uveitis affects the structures in the back of the eye. This includes the light-sensitive layer of cells that is needed for vision (retina). This is the least common type.  Panuveitis affects all layers of the eye. Uveitis can affect one eye or both eyes. It can cause short-term or long-term symptoms. Symptoms may go away and come back. Over time, the condition can damage or destroy eye structures and can lead to vision loss. CAUSES This condition may be caused by:  Infections that start in the eye or spread to the eye.  Inflammatory diseases that can affect the eye.  Autoimmune diseases. These are diseases in which the body's defense system (immune system) mistakenly attacks the body's own tissues.  Eye injuries. In some cases, the cause may not be known. RISK FACTORS This condition is more likely to develop in people who are 10-36 years old. SYMPTOMS Symptoms of this condition depend on the type of uveitis. Common symptoms include:  Eye redness.  Eye pain.  Blurred vision.  Decreased vision.  Floating dark spots in your vision  (floaters).  Sensitivity to light.  A white spot in the lower part of the iris (hypopyon). DIAGNOSIS This condition is usually diagnosed by an eye specialist (ophthalmologist). The ophthalmologist will do a complete eye exam. This exam may include:  A vision test using eye charts.  An exam that involves using a scope for viewing inside the eye (ophthalmoscope or slit lamp). Eye drops may be used to widen (dilate) your pupil to make it easier to see inside your eye.  A test to measure eye pressure. You may have other medical tests to help determine the cause of your uveitis. TREATMENT Treatment for this condition may depend on the type of uveitis that you have. It should be started right away to help prevent vision loss. Treatment may include:  Medicine to block inflammation (steroids). You may get steroids:  As eye drops.  As an injection into your eye.  By mouth.  Eye drops to dilate the pupil and reduce pressure inside the eye.  In some cases, medicines may be given through an IV tube or through a device that is implanted inside the eye. HOME CARE INSTRUCTIONS  Take medicines only as directed by your health care provider. Use eye drops exactly as directed.  Follow instructions from your health care provider about any restrictions on your activities. Ask what activities are safe for you.  Do not use any tobacco products, including cigarettes, chewing tobacco, or electronic cigarettes. If you need help quitting, ask your health care provider.  Keep all follow-up visits  as directed by your health care provider. This is important. SEEK MEDICAL CARE IF:  Your medicines are not working. SEEK IMMEDIATE MEDICAL CARE IF:  You have redness in one eye or both eyes.  Your eyes are very sensitive to light.  You have pain or aching in either eye.  You have vision loss in either eye.   This information is not intended to replace advice given to you by your health care provider.  Make sure you discuss any questions you have with your health care provider.   Document Released: 02/21/2009 Document Revised: 04/11/2015 Document Reviewed: 11/30/2014 Elsevier Interactive Patient Education 2016 Elsevier Inc[employer]   Bronchospasm, Adult A bronchospasm is a spasm or tightening of the airways going into the lungs. During a bronchospasm breathing becomes more difficult because the airways get smaller. When this happens there can be coughing, a whistling sound when breathing (wheezing), and difficulty breathing. Bronchospasm is often associated with asthma, but not all patients who experience a bronchospasm have asthma. CAUSES  A bronchospasm is caused by inflammation or irritation of the airways. The inflammation or irritation may be triggered by:   Allergies (such as to animals, pollen, food, or mold). Allergens that cause bronchospasm may cause wheezing immediately after exposure or many hours later.   Infection. Viral infections are believed to be the most common cause of bronchospasm.   Exercise.   Irritants (such as pollution, cigarette smoke, strong odors, aerosol sprays, and paint fumes).   Weather changes. Winds increase molds and pollens in the air. Rain refreshes the air by washing irritants out. Cold air may cause inflammation.   Stress and emotional upset.  SIGNS AND SYMPTOMS   Wheezing.   Excessive nighttime coughing.   Frequent or severe coughing with a simple cold.   Chest tightness.   Shortness of breath.  DIAGNOSIS  Bronchospasm is usually diagnosed through a history and physical exam. Tests, such as chest X-rays, are sometimes done to look for other conditions. TREATMENT   Inhaled medicines can be given to open up your airways and help you breathe. The medicines can be given using either an inhaler or a nebulizer machine.  Corticosteroid medicines may be given for severe bronchospasm, usually when it is associated with asthma. HOME CARE  INSTRUCTIONS   Always have a plan prepared for seeking medical care. Know when to call your health care provider and local emergency services (911 in the [place] Know where you can access local emergency care.  Only take medicines as directed by your health care provider.  If you were prescribed an inhaler or nebulizer machine, ask your health care provider to explain how to use it correctly. Always use a spacer with your inhaler if you were given one.  It is necessary to remain calm during an attack. Try to relax and breathe more slowly.  Control your home environment in the following ways:   Change your heating and air conditioning filter at least once a month.   Limit your use of fireplaces and wood stoves.  Do not smoke and do not allow smoking in your home.   Avoid exposure to perfumes and fragrances.   Get rid of pests (such as roaches and mice) and their droppings.   Throw away plants if you see mold on them.   Keep your house clean and dust free.   Replace carpet with wood, tile, or vinyl flooring. Carpet can trap dander and dust.   Use allergy-proof pillows, mattress covers, and box spring covers.  Wash bed sheets and blankets every week in hot water and dry them in a dryer.   Use blankets that are made of polyester or cotton.   Wash hands frequently. SEEK MEDICAL CARE IF:   You have muscle aches.   You have chest pain.   The sputum changes from clear or white to yellow, green, gray, or bloody.   The sputum you cough up gets thicker.   There are problems that may be related to the medicine you are given, such as a rash, itching, swelling, or trouble breathing.  SEEK IMMEDIATE MEDICAL CARE IF:   You have worsening wheezing and coughing even after taking your prescribed medicines.   You have increased difficulty breathing.   You develop severe chest pain. MAKE SURE YOU:   Understand these instructions.  Will watch your  condition.  Will get help right away if you are not doing well or get worse.   This information is not intended to replace advice given to you by your health care provider. Make sure you discuss any questions you have with your health care provider.   Document Released: 11/28/2003 Document Revised: 12/16/2014 Document Reviewed: 05/17/2013 Elsevier Interactive Patient Education [employer].

## 2016-06-13 NOTE — ED Notes (Signed)
Pt has multiple complaints, red, burning eyes, cough, congestion, ear ache, hoarness. Pt states this happened after working with flamable, explosive glue, felt his inhaled vapors.

## 2016-06-13 NOTE — ED Provider Notes (Signed)
CSN: [id number]     Arrival date & time 06/13/16  1834 History   First MD Initiated Contact with Patient 06/13/16 1853     Chief Complaint  Patient presents with  . Chemical Exposure     (Consider location/radiation/quality/duration/timing/severity/associated sxs/prior Treatment) HPI patient presents with one week of bilateral eye redness, pain and discharge. Also complains of pain to the right ear. She's had sore throat and hoarseness. Also complains of cough and right-sided chest pain with coughing. He's had diaphoresis and subjective fevers and chills. He states all the symptoms began after exposure to "burning glue". No known sick contacts. Past Medical History  Diagnosis Date  . CHF (congestive heart failure) ([place])   . Cancer ([place])     skin  . Lumbar disc disorder   . Stroke [facility])    Past Surgical History  Procedure Laterality Date  . Nose surgery     Family History  Problem Relation Age of Onset  . Cancer Mother   . Parkinson's disease Father   . Alzheimer's disease Father    Social History  Substance Use Topics  . Smoking status: Current Every Day Smoker -- 1.00 packs/day for 40 years    Types: Cigarettes    Start date: 02/02/1969  . Smokeless tobacco: Never Used  . Alcohol Use: 0.0 oz/week    0 Standard drinks or equivalent per week     Comment: occasional    Review of Systems  Constitutional: Positive for fever, chills, diaphoresis and fatigue.  HENT: Positive for congestion, ear pain, sore throat and voice change.   Eyes: Positive for photophobia, pain, discharge, redness and visual disturbance.  Respiratory: Positive for cough and shortness of breath. Negative for wheezing.   Cardiovascular: Positive for chest pain. Negative for palpitations and leg swelling.  Gastrointestinal: Negative for nausea, vomiting, abdominal pain, diarrhea and constipation.  Musculoskeletal: Positive for myalgias and back pain. Negative for neck pain and neck stiffness.  Skin:  Negative for rash and wound.  Neurological: Negative for dizziness, weakness, light-headedness, numbness and headaches.  All other systems reviewed and are negative.     Allergies  Review of patient's allergies indicates no known allergies.  Home Medications   Prior to Admission medications   Medication Sig Start Date End Date Taking? Authorizing Provider  benzonatate (TESSALON) 100 MG capsule Take 1 capsule (100 mg total) by mouth 3 (three) times daily as needed for cough. 06/13/16   Julianne Rice, MD  cyclopentolate (CYCLODRYL,CYCLOGYL) 1 % ophthalmic solution Place 2 drops into both eyes daily as needed (photophobia, eye pain). 06/13/16   Julianne Rice, MD  loratadine (CLARITIN) 10 MG tablet Take 1 tablet (10 mg total) by mouth daily. 06/13/16   Julianne Rice, MD  prednisoLONE acetate (PRED FORTE) 1 % ophthalmic suspension Place 2 drops into both eyes 2 (two) times daily. 06/13/16   Julianne Rice, MD  predniSONE (DELTASONE) 20 MG tablet 3 tabs po day one, then 2 po daily x 4 days 06/13/16   Julianne Rice, MD   BP 130/91 mmHg  Pulse 82  Temp(Src) 99.5 F (37.5 C) (Oral)  Resp 20  Ht 5\' 9"  (1.753 m)  Wt 190 lb (86.183 kg)  BMI 28.05 kg/m2  SpO2 99% Physical Exam  Constitutional: He is oriented to person, place, and time. He appears well-developed and well-nourished. No distress.  HENT:  Head: Normocephalic and atraumatic.  Mouth/Throat: Oropharynx is clear and moist.  Bilateral nasal mucosal edema. Oropharynx is erythematous. TMs bulging bilaterally.  Eyes: EOM  are normal. Pupils are equal, round, and reactive to light. Right eye exhibits discharge. Left eye exhibits discharge.  Photophobia and consensual photophobia. Injected sclera with no foreign bodies visualized. Small amount of dried yellow discharge  Miosis bilaterally. Woods lamp exam after fluorescein staining with no definite uptake.   Neck: Normal range of motion. Neck supple.  Right greater than left anterior  cervical lymphadenopathy. No meningismus.  Cardiovascular: Normal rate and regular rhythm.  Exam reveals no gallop and no friction rub.   No murmur heard. Pulmonary/Chest: Effort normal and breath sounds normal. No respiratory distress. He has no wheezes. He has no rales. He exhibits no tenderness.  Abdominal: Soft. Bowel sounds are normal. He exhibits no distension and no mass. There is no tenderness. There is no rebound and no guarding.  Musculoskeletal: Normal range of motion. He exhibits tenderness. He exhibits no edema.  Patient does have right greater than left thoracic muscular tenderness to palpation especially over the right trapezius. No CVA tenderness. No lower extremity swelling or asymmetry. Distal pulses are equal and intact.  Lymphadenopathy:    He has cervical adenopathy.  Neurological: He is alert and oriented to person, place, and time.  Moves all extremity is without deficit. Sensation is fully intact.  Skin: Skin is warm and dry. No rash noted. No erythema.  Psychiatric: He has a normal mood and affect. His behavior is normal.  Nursing note and vitals reviewed.   ED Course  Procedures (including critical care time) Labs Review Labs Reviewed  CBC WITH DIFFERENTIAL/PLATELET - Abnormal; Notable for the following:    RBC 3.91 (*)    HCT 38.5 (*)    All other components within normal limits  COMPREHENSIVE METABOLIC PANEL - Abnormal; Notable for the following:    Potassium 3.2 (*)    Glucose, Bld 113 (*)    Calcium 8.5 (*)    Total Protein 6.1 (*)    All other components within normal limits  RAPID STREP SCREEN (NOT AT [facility])  CULTURE, GROUP A STREP [facility])    Imaging Review Dg Chest 2 View  06/13/2016  CLINICAL DATA:  Cough and chest pain for 3 days.  Current smoker. EXAM: CHEST  2 VIEW COMPARISON:  Radiograph 01/03/2015 FINDINGS: Mild hyperinflation and coarse lung markings. No focal airspace opacity. Previously described opacity on the lateral view is no longer seen.  No pulmonary edema, pleural effusion or pneumothorax. Osseous structures are intact. 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 No superimposed acute process. Electronically Signed   By: Jeb Levering M.D.   On: 06/13/2016 20:16   I have personally reviewed and evaluated these images and lab results as part of my medical decision-making.   EKG Interpretation None      MDM   Final diagnoses:  Acute iritis, bilateral  Inhalation injury  Bronchospasm   Discussed with Dr. Yolanda Bonine. Recommends cytoplegics and ophthalmic steroids and follow-up in the office on Monday.    Discussed return precautions with the patient. Advised to establish care with primary physician.  Julianne Rice, MD 06/13/16 2154

## 2016-06-16 LAB — CULTURE, GROUP A STREP (THRC)

## 2017-01-09 ENCOUNTER — Emergency Department (HOSPITAL_COMMUNITY): Payer: Self-pay

## 2017-01-09 ENCOUNTER — Observation Stay (HOSPITAL_COMMUNITY)
Admission: EM | Admit: 2017-01-09 | Discharge: 2017-01-10 | Disposition: A | Discharge status: Home / Self Care | Payer: Self-pay | Attending: Internal Medicine | Admitting: Internal Medicine

## 2017-01-09 ENCOUNTER — Encounter (HOSPITAL_COMMUNITY): Payer: Self-pay

## 2017-01-09 DIAGNOSIS — I6523 Occlusion and stenosis of bilateral carotid arteries: Secondary | ICD-10-CM | POA: Insufficient documentation

## 2017-01-09 DIAGNOSIS — F1410 Cocaine abuse, uncomplicated: Secondary | ICD-10-CM | POA: Insufficient documentation

## 2017-01-09 DIAGNOSIS — H538 Other visual disturbances: Secondary | ICD-10-CM | POA: Insufficient documentation

## 2017-01-09 DIAGNOSIS — I639 Cerebral infarction, unspecified: Secondary | ICD-10-CM

## 2017-01-09 DIAGNOSIS — R51 Headache: Secondary | ICD-10-CM | POA: Insufficient documentation

## 2017-01-09 DIAGNOSIS — I509 Heart failure, unspecified: Secondary | ICD-10-CM | POA: Insufficient documentation

## 2017-01-09 DIAGNOSIS — M519 Unspecified thoracic, thoracolumbar and lumbosacral intervertebral disc disorder: Secondary | ICD-10-CM | POA: Insufficient documentation

## 2017-01-09 DIAGNOSIS — F329 Major depressive disorder, single episode, unspecified: Principal | ICD-10-CM | POA: Insufficient documentation

## 2017-01-09 DIAGNOSIS — Z9114 Patient's other noncompliance with medication regimen: Secondary | ICD-10-CM | POA: Insufficient documentation

## 2017-01-09 DIAGNOSIS — H539 Unspecified visual disturbance: Secondary | ICD-10-CM

## 2017-01-09 DIAGNOSIS — E785 Hyperlipidemia, unspecified: Secondary | ICD-10-CM | POA: Insufficient documentation

## 2017-01-09 DIAGNOSIS — F4320 Adjustment disorder, unspecified: Secondary | ICD-10-CM | POA: Insufficient documentation

## 2017-01-09 DIAGNOSIS — Z8673 Personal history of transient ischemic attack (TIA), and cerebral infarction without residual deficits: Secondary | ICD-10-CM | POA: Insufficient documentation

## 2017-01-09 DIAGNOSIS — Z85828 Personal history of other malignant neoplasm of skin: Secondary | ICD-10-CM | POA: Insufficient documentation

## 2017-01-09 DIAGNOSIS — G9389 Other specified disorders of brain: Secondary | ICD-10-CM | POA: Insufficient documentation

## 2017-01-09 DIAGNOSIS — G8929 Other chronic pain: Secondary | ICD-10-CM | POA: Insufficient documentation

## 2017-01-09 DIAGNOSIS — I110 Hypertensive heart disease with heart failure: Secondary | ICD-10-CM | POA: Insufficient documentation

## 2017-01-09 DIAGNOSIS — M545 Low back pain: Secondary | ICD-10-CM | POA: Insufficient documentation

## 2017-01-09 DIAGNOSIS — Q211 Atrial septal defect: Secondary | ICD-10-CM | POA: Insufficient documentation

## 2017-01-09 DIAGNOSIS — Z7982 Long term (current) use of aspirin: Secondary | ICD-10-CM | POA: Insufficient documentation

## 2017-01-09 DIAGNOSIS — F17200 Nicotine dependence, unspecified, uncomplicated: Secondary | ICD-10-CM | POA: Insufficient documentation

## 2017-01-09 DIAGNOSIS — I6320 Cerebral infarction due to unspecified occlusion or stenosis of unspecified precerebral arteries: Secondary | ICD-10-CM

## 2017-01-09 LAB — I-STAT CHEM 8, ED
BUN: 12 mg/dL (ref 6–20)
CALCIUM ION: 1.14 mmol/L — AB (ref 1.15–1.40)
Chloride: 103 mmol/L (ref 101–111)
Creatinine, Ser: 1.1 mg/dL (ref 0.61–1.24)
Glucose, Bld: 206 mg/dL — ABNORMAL HIGH (ref 65–99)
HEMATOCRIT: 48 % (ref 39.0–52.0)
HEMOGLOBIN: 16.3 g/dL (ref 13.0–17.0)
Potassium: 3.5 mmol/L (ref 3.5–5.1)
SODIUM: 140 mmol/L (ref 135–145)
TCO2: 22 mmol/L (ref 0–100)

## 2017-01-09 LAB — COMPREHENSIVE METABOLIC PANEL
ALBUMIN: 4.5 g/dL (ref 3.5–5.0)
ALT: 39 U/L (ref 17–63)
ANION GAP: 14 (ref 5–15)
AST: 30 U/L (ref 15–41)
Alkaline Phosphatase: 79 U/L (ref 38–126)
BUN: 11 mg/dL (ref 6–20)
CHLORIDE: 103 mmol/L (ref 101–111)
CO2: 20 mmol/L — AB (ref 22–32)
Calcium: 9.4 mg/dL (ref 8.9–10.3)
Creatinine, Ser: 1.24 mg/dL (ref 0.61–1.24)
GFR calc non Af Amer: >60 mL/min (ref >60)
GLUCOSE: 208 mg/dL — AB (ref 65–99)
POTASSIUM: 3.6 mmol/L (ref 3.5–5.1)
SODIUM: 137 mmol/L (ref 135–145)
Total Bilirubin: 0.9 mg/dL (ref 0.3–1.2)
Total Protein: 6.7 g/dL (ref 6.5–8.1)

## 2017-01-09 LAB — PROTIME-INR
INR: 0.95
PROTHROMBIN TIME: 12.7 s (ref 11.4–15.2)

## 2017-01-09 LAB — CBG MONITORING, ED: GLUCOSE-CAPILLARY: 222 mg/dL — AB (ref 65–99)

## 2017-01-09 LAB — APTT: aPTT: 30 seconds (ref 24–36)

## 2017-01-09 LAB — CBC
HCT: 46.9 % (ref 39.0–52.0)
Hemoglobin: 16.7 g/dL (ref 13.0–17.0)
MCH: 33.5 pg (ref 26.0–34.0)
MCHC: 35.6 g/dL (ref 30.0–36.0)
MCV: 94.2 fL (ref 78.0–100.0)
PLATELETS: 262 10*3/uL (ref 150–400)
RBC: 4.98 MIL/uL (ref 4.22–5.81)
RDW: 12.2 % (ref 11.5–15.5)
WBC: 10.6 10*3/uL — AB (ref 4.0–10.5)

## 2017-01-09 LAB — I-STAT TROPONIN, ED: Troponin i, poc: 0 ng/mL (ref 0.00–0.08)

## 2017-01-09 LAB — DIFFERENTIAL
BASOS PCT: 0 %
Basophils Absolute: 0 10*3/uL (ref 0.0–0.1)
EOS PCT: 0 %
Eosinophils Absolute: 0 10*3/uL (ref 0.0–0.7)
LYMPHS PCT: 22 %
Lymphs Abs: 2.3 10*3/uL (ref 0.7–4.0)
MONO ABS: 0.4 10*3/uL (ref 0.1–1.0)
Monocytes Relative: 4 %
NEUTROS PCT: 74 %
Neutro Abs: 7.7 10*3/uL (ref 1.7–7.7)

## 2017-01-09 NOTE — ED Triage Notes (Addendum)
Pt from home with intermittment right sided headache behind right eye, bilateral intermittent blurred vision and "I feel hot in my my upper body" Pt has hx of CVA and states "this is how I felt when I had my stroke". No neuro deficits at this time. Equal grips, PERRL. Pt alert and oriented x 4. VSS. LSN 0730 this AM.

## 2017-01-09 NOTE — ED Provider Notes (Signed)
[facility] DEPT Provider Note   CSN: [id number] Arrival date & time: 01/09/17  1723     History   Chief Complaint Chief Complaint  Patient presents with  . Stroke Symptoms  . Headache    HPI Jason Walsh is a 56 y.o. male.  Jason Walsh is a 56 y.o. Male with a history of a previous cerebellar infarct who presents to the ED complaining of intermittent headaches for the past 3 weeks, that worsened last night. He complains of a right-sided headache behind his right eye and intermittent blurry vision. He tells me around 4 hours ago he had an episode where he felt very flushed and had a worsening headache with "screaming in my ears." He reports this lasted about an hour and resolved. He currently complains of a 3/10 headache. Daughters at bedside report he had slurred speech and looked pale and sweaty during this episode tonight. Patient denies fevers, head injury, chest pain, shortness of breath, palpitations, numbness, tingling, weakness, double vision, sore throat, neck pain, back pain, trouble ambulating or rashes.   The history is provided by the patient and medical records. No language interpreter was used.  Headache   Pertinent negatives include no fever, no shortness of breath, no nausea and no vomiting.    Past Medical History:  Diagnosis Date  . Cancer ([place])    skin  . CHF (congestive heart failure) ([place])   . Lumbar disc disorder   . Stroke [facility])     Patient Active Problem List   Diagnosis Date Noted  . Vision changes 01/10/2017  . Hyperlipidemia 01/04/2015  . Cocaine abuse 01/04/2015  . Unsteady gait 01/04/2015  . Cerebellar infarct ([place]) 01/03/2015  . Tobacco abuse 01/03/2015    Past Surgical History:  Procedure Laterality Date  . NOSE SURGERY         Home Medications    Prior to Admission medications   Not on File    Family History Family History  Problem Relation Age of Onset  . Cancer Mother   . Parkinson's disease Father   .  Alzheimer's disease Father     Social History Social History  Substance Use Topics  . Smoking status: Current Every Day Smoker    Packs/day: 1.00    Years: 40.00    Types: Cigarettes    Start date: 02/02/1969  . Smokeless tobacco: Never Used  . Alcohol use 0.0 oz/week     Comment: occasional     Allergies   Patient has no known allergies.   Review of Systems Review of Systems  Constitutional: Negative for chills and fever.  HENT: Negative for congestion, ear pain and sore throat.   Eyes: Positive for visual disturbance. Negative for photophobia and pain.  Respiratory: Negative for cough, shortness of breath and wheezing.   Cardiovascular: Negative for chest pain.  Gastrointestinal: Negative for abdominal pain, diarrhea, nausea and vomiting.  Genitourinary: Negative for dysuria.  Musculoskeletal: Negative for back pain and neck pain.  Skin: Negative for rash.  Neurological: Positive for headaches. Negative for dizziness, syncope, weakness, light-headedness and numbness.     Physical Exam Updated Vital Signs BP 121/66   Pulse 73   Temp 97.2 F (36.2 C) (Oral)   Resp 13   Ht 5\' 9"  (1.753 m)   Wt 83.9 kg   SpO2 95%   BMI 27.32 kg/m   Physical Exam  Constitutional: He is oriented to person, place, and time. He appears well-developed and well-nourished. No distress.  Nontoxic  appearing.  HENT:  Head: Normocephalic and atraumatic.  Right Ear: External ear normal.  Left Ear: External ear normal.  Mouth/Throat: Oropharynx is clear and moist.  Bilateral tympanic membranes are pearly-gray without erythema or loss of landmarks.    Eyes: Conjunctivae and EOM are normal. Pupils are equal, round, and reactive to light. Right eye exhibits no discharge. Left eye exhibits no discharge.  EOMs intact. Left homonymous hemianopsia noted.   Neck: Normal range of motion. Neck supple. No JVD present. No tracheal deviation present.  Cardiovascular: Normal rate, regular rhythm,  normal heart sounds and intact distal pulses.  Exam reveals no gallop and no friction rub.   No murmur heard. Bilateral radial, and posterior tibialis pulses are intact.    Pulmonary/Chest: Effort normal and breath sounds normal. No stridor. No respiratory distress. He has no wheezes. He has no rales.  Abdominal: Soft. There is no tenderness.  Musculoskeletal: Normal range of motion. He exhibits no edema or tenderness.  No lower extremity edema or tenderness. 5/5 strength in his bilateral upper and lower extremities.  Lymphadenopathy:    He has no cervical adenopathy.  Neurological: He is alert and oriented to person, place, and time. No sensory deficit. He exhibits normal muscle tone. Coordination normal.  Left homonymous hemianopsia noted. CN otherwise intact. Normal gait. No pronator drift. Some difficulty with finger to nose bilaterally. Sensation is intact in his bilateral upper and lower extremities.  Skin: Skin is warm and dry. Capillary refill takes less than 2 seconds. No rash noted. He is not diaphoretic. No erythema. No pallor.  Psychiatric: He has a normal mood and affect. His behavior is normal.  Nursing note and vitals reviewed.    ED Treatments / Results  Labs (all labs ordered are listed, but only abnormal results are displayed) Labs Reviewed  CBC - Abnormal; Notable for the following:       Result Value   WBC 10.6 (*)    All other components within normal limits  COMPREHENSIVE METABOLIC PANEL - Abnormal; Notable for the following:    CO2 20 (*)    Glucose, Bld 208 (*)    All other components within normal limits  CBG MONITORING, ED - Abnormal; Notable for the following:    Glucose-Capillary 222 (*)    All other components within normal limits  I-STAT CHEM 8, ED - Abnormal; Notable for the following:    Glucose, Bld 206 (*)    Calcium, Ion 1.14 (*)    All other components within normal limits  PROTIME-INR  APTT  DIFFERENTIAL  I-STAT TROPOININ, ED    EKG   EKG Interpretation  Date/Time:  Thursday January 09 2017 17:47:18 EST Ventricular Rate:  74 PR Interval:  146 QRS Duration: 98 QT Interval:  412 QTC Calculation: 457 R Axis:   -34 Text Interpretation:  Normal sinus rhythm Left axis deviation Incomplete right bundle branch block Moderate voltage criteria for LVH, may be normal variant Abnormal ECG Confirmed by Hazle Coca [phone]) on 01/09/2017 9:12:18 PM       Radiology Ct Head Wo Contrast  Result Date: 01/09/2017 CLINICAL DATA:  Right-sided headache.  History of CVA. EXAM: CT HEAD WITHOUT CONTRAST TECHNIQUE: Contiguous axial images were obtained from the base of the skull through the vertex without intravenous contrast. COMPARISON:  01/03/2015 head CT. FINDINGS: Brain: Focus of encephalomalacia in the medial inferior left cerebellar hemisphere. No evidence of parenchymal hemorrhage or extra-axial fluid collection. No mass lesion, mass effect, or midline shift. No CT evidence  of acute infarction. Cerebral volume is age appropriate. No ventriculomegaly. Vascular: No hyperdense vessel or unexpected calcification. Skull: No evidence of calvarial fracture. Sinuses/Orbits: The visualized paranasal sinuses are essentially clear. Other:  The mastoid air cells are unopacified. IMPRESSION: 1.  No evidence of acute intracranial abnormality. 2. Focal encephalomalacia in the inferior left cerebellar hemisphere from prior infarct. Electronically Signed   By: Ilona Sorrel M.D.   On: 01/09/2017 18:24   Dg Chest Port 1 View  Result Date: 01/09/2017 CLINICAL DATA:  Headache and feeling flushed today. EXAM: PORTABLE CHEST 1 VIEW COMPARISON:  Chest x-ray dated 06/13/2016. FINDINGS: The heart size and mediastinal contours are within normal limits. Both lungs are clear. The visualized skeletal structures are unremarkable. IMPRESSION: No active disease.  No evidence of pneumonia or pulmonary edema. Electronically Signed   By: Franki Cabot M.D.   On: 01/09/2017 20:50     Procedures Procedures (including critical care time)  Medications Ordered in ED Medications - No data to display   Initial Impression / Assessment and Plan / ED Course  I have reviewed the triage vital signs and the nursing notes.  Pertinent labs & imaging results that were available during my care of the patient were reviewed by me and considered in my medical decision making (see chart for details).    This is a 56 y.o. Male with a history of a previous cerebellar infarct who presents to the ED complaining of intermittent headaches for the past 3 weeks, that worsened last night. He complains of a right-sided headache behind his right eye and intermittent blurry vision. He tells me around 4 hours ago he had an episode where he felt very flushed and had a worsening headache with "screaming in my ears." He reports this lasted about an hour and resolved. He currently complains of a 3/10 headache. Daughters at bedside report he had slurred speech and looked pale and sweaty during this episode tonight. On exam patient is afebrile nontoxic appearing. Only focal neurological deficit noted is a left homonymous hemianopsia. Blood work is remarkable for a glucose of 208. Blood work is otherwise unremarkable. Troponin is not elevated. CT head without contrast shows no acute intracranial abnormality. Chest x-ray is unremarkable.  I consulted with neurohospitalist Dr. Cheral Marker who came down to evaluate the patient. He would like the patient admitted to medicine for stroke work up.   I discussed this with the patient and family who agree with plan for admission for stroke work up.   I consulted with Internal medicine teaching service who accepted the patient for admission and requested temp orders for tele bed.   This patient was discussed with and evaluated by Dr. Ralene Bathe who agrees with assessment and plan.   Final Clinical Impressions(s) / ED Diagnoses   Final diagnoses:  Stroke [facility])  Stroke  [facility])    New Prescriptions New Prescriptions   No medications on file     Waynetta Pean, PA-C 01/10/17 0017    Quintella Reichert, MD 01/12/17 1309

## 2017-01-09 NOTE — Consult Note (Signed)
Referring Physician: Dr. Ralene Bathe    Chief Complaint: Severe right sided headache  HPI: Jason Walsh is an 56 y.o. male who presented from home with severe "20/10" right temporal and periorbital headache. The headache is intermittent but has been increasing in severity and frequency since yesterday. He has had headache off an on for about the past 3 weeks; he does not have a prior history of migraine. Also endorses intermittent bilateral blurred vision. Spells of headache pain have been preceded by a "hot sensation" beginning anteriorly in the lower chest bilaterally and rising upwards to include his head. Also with severe bilateral intermittent tinnitus.   He has a history of stroke 2 years ago that manifested with gait unsteadiness and dizziness. An old left cerebellar stroke is seen on his CT head obtained this evening. He states that he smokes but does not have a prior diagnosis of HTN, DM or MI. He has a history of skin cancer involving the face and forehead, with multiple plastic surgeries in the past.   He denies peripheral visual field loss, stating that his blurred vision is bilateral. No confusion or dysphasia.   Additional information from ED note reviewed: "Daughters at bedside report he had slurred speech and looked pale and sweaty during this episode tonight."    Past Medical History:  Diagnosis Date  . Cancer ([place])    skin  . CHF (congestive heart failure) ([place])   . Lumbar disc disorder   . Stroke [facility])     Past Surgical History:  Procedure Laterality Date  . NOSE SURGERY      Family History  Problem Relation Age of Onset  . Cancer Mother   . Parkinson's disease Father   . Alzheimer's disease Father    Social History:  reports that he has been smoking Cigarettes.  He started smoking about 47 years ago. He has a 40.00 pack-year smoking history. He has never used smokeless tobacco. He reports that he drinks alcohol. He reports that he does not use drugs.  Allergies: No  Known Allergies  Medications: Patient states that he is not on any medications.   ROS: No recent head injury. No numbness, weakness, paresthesia or double vision. No difficulty with ambulation. No CP or SOB.    Physical Examination: Blood pressure 121/66, pulse 73, temperature 97.2 F (36.2 C), temperature source Oral, resp. rate 13, height 5' 9" (1.753 m), weight 83.9 kg (185 lb), SpO2 95 %.  HEENT: Old scarring from prior facial surgeries.  Lungs: No gross wheezing. Respirations unlabored.  Ext: Warm and well perfused. No edema.   Neurologic Examination: Ment: Intact to complex questions and commands. No receptive or expressive aphasia.  CN: PERRL. No field cut noted on individual testing of each eye. No extinction to double simultaneous visual stimulation. EOMI without nystagmus. Facial sensation normal. Facial motor symmetric. Hearing intact to conversation. No hypophonia. Shoulder shrug normal. Tongue midline.  Motor: 5/5 x 4 without asymmetry.  Sensory: Intact to FT and temp x 4 without extinction.  Reflexes: Normoactive x 4.  Cerebellar: No ataxia with FNF bilaterally.  Gait: Deferred.   Results for orders placed or performed during the hospital encounter of 01/09/17 (from the past 48 hour(s))  Protime-INR     Status: None   Collection Time: 01/09/17  5:36 PM  Result Value Ref Range   Prothrombin Time 12.7 11.4 - 15.2 seconds   INR 0.95   APTT     Status: None   Collection Time: 01/09/17  5:36 PM  Result Value Ref Range   aPTT 30 24 - 36 seconds  CBC     Status: Abnormal   Collection Time: 01/09/17  5:36 PM  Result Value Ref Range   WBC 10.6 (H) 4.0 - 10.5 K/uL   RBC 4.98 4.22 - 5.81 MIL/uL   Hemoglobin 16.7 13.0 - 17.0 g/dL   HCT 46.9 39.0 - 52.0 %   MCV 94.2 78.0 - 100.0 fL   MCH 33.5 26.0 - 34.0 pg   MCHC 35.6 30.0 - 36.0 g/dL   RDW 12.2 11.5 - 15.5 %   Platelets 262 150 - 400 K/uL  Differential     Status: None   Collection Time: 01/09/17  5:36 PM  Result  Value Ref Range   Neutrophils Relative % 74 %   Neutro Abs 7.7 1.7 - 7.7 K/uL   Lymphocytes Relative 22 %   Lymphs Abs 2.3 0.7 - 4.0 K/uL   Monocytes Relative 4 %   Monocytes Absolute 0.4 0.1 - 1.0 K/uL   Eosinophils Relative 0 %   Eosinophils Absolute 0.0 0.0 - 0.7 K/uL   Basophils Relative 0 %   Basophils Absolute 0.0 0.0 - 0.1 K/uL  Comprehensive metabolic panel     Status: Abnormal   Collection Time: 01/09/17  5:36 PM  Result Value Ref Range   Sodium 137 135 - 145 mmol/L   Potassium 3.6 3.5 - 5.1 mmol/L   Chloride 103 101 - 111 mmol/L   CO2 20 (L) 22 - 32 mmol/L   Glucose, Bld 208 (H) 65 - 99 mg/dL   BUN 11 6 - 20 mg/dL   Creatinine, Ser 1.24 0.61 - 1.24 mg/dL   Calcium 9.4 8.9 - 10.3 mg/dL   Total Protein 6.7 6.5 - 8.1 g/dL   Albumin 4.5 3.5 - 5.0 g/dL   AST 30 15 - 41 U/L   ALT 39 17 - 63 U/L   Alkaline Phosphatase 79 38 - 126 U/L   Total Bilirubin 0.9 0.3 - 1.2 mg/dL   GFR calc non Af Amer >60 >60 mL/min   GFR calc Af Amer >60 >60 mL/min    Comment: (NOTE) The eGFR has been calculated using the CKD EPI equation. This calculation has not been validated in all clinical situations. eGFR's persistently <60 mL/min signify possible Chronic Kidney Disease.    Anion gap 14 5 - 15  CBG monitoring, ED     Status: Abnormal   Collection Time: 01/09/17  5:50 PM  Result Value Ref Range   Glucose-Capillary 222 (H) 65 - 99 mg/dL   Comment 1 Notify RN    Comment 2 Document in Chart   I-stat troponin, ED     Status: None   Collection Time: 01/09/17  6:28 PM  Result Value Ref Range   Troponin i, poc 0.00 0.00 - 0.08 ng/mL   Comment 3            Comment: Due to the release kinetics of cTnI, a negative result within the first hours of the onset of symptoms does not rule out myocardial infarction with certainty. If myocardial infarction is still suspected, repeat the test at appropriate intervals.   I-Stat Chem 8, ED     Status: Abnormal   Collection Time: 01/09/17  6:30 PM   Result Value Ref Range   Sodium 140 135 - 145 mmol/L   Potassium 3.5 3.5 - 5.1 mmol/L   Chloride 103 101 - 111 mmol/L   BUN 12  6 - 20 mg/dL   Creatinine, Ser 1.10 0.61 - 1.24 mg/dL   Glucose, Bld 206 (H) 65 - 99 mg/dL   Calcium, Ion 1.14 (L) 1.15 - 1.40 mmol/L   TCO2 22 0 - 100 mmol/L   Hemoglobin 16.3 13.0 - 17.0 g/dL   HCT 48.0 39.0 - 52.0 %   Ct Head Wo Contrast  Result Date: 01/09/2017 CLINICAL DATA:  Right-sided headache.  History of CVA. EXAM: CT HEAD WITHOUT CONTRAST TECHNIQUE: Contiguous axial images were obtained from the base of the skull through the vertex without intravenous contrast. COMPARISON:  01/03/2015 head CT. FINDINGS: Brain: Focus of encephalomalacia in the medial inferior left cerebellar hemisphere. No evidence of parenchymal hemorrhage or extra-axial fluid collection. No mass lesion, mass effect, or midline shift. No CT evidence of acute infarction. Cerebral volume is age appropriate. No ventriculomegaly. Vascular: No hyperdense vessel or unexpected calcification. Skull: No evidence of calvarial fracture. Sinuses/Orbits: The visualized paranasal sinuses are essentially clear. Other:  The mastoid air cells are unopacified. IMPRESSION: 1.  No evidence of acute intracranial abnormality. 2. Focal encephalomalacia in the inferior left cerebellar hemisphere from prior infarct. Electronically Signed   By: Ilona Sorrel M.D.   On: 01/09/2017 18:24   Dg Chest Port 1 View  Result Date: 01/09/2017 CLINICAL DATA:  Headache and feeling flushed today. EXAM: PORTABLE CHEST 1 VIEW COMPARISON:  Chest x-ray dated 06/13/2016. FINDINGS: The heart size and mediastinal contours are within normal limits. Both lungs are clear. The visualized skeletal structures are unremarkable. IMPRESSION: No active disease.  No evidence of pneumonia or pulmonary edema. Electronically Signed   By: Franki Cabot M.D.   On: 01/09/2017 20:50    Assessment: 56 y.o. male with acute onset of severe right sided  headache.  1. Hemianopsia noted by ED attending on initial exam has now resolved. DDx includes posterior fossa TIA.  2. CT head reveals chronic left cerebellar infarction.  3. Stroke risk factors: Smoking and prior stroke 4. Blurred vision with right temporal headache. DDx includes temporal arteritis.   Plan: 1. HgbA1c, fasting lipid panel 2. MRI, MRA  of the brain without contrast 3. PT consult, OT consult, Speech consult 4. Echocardiogram 5. Carotid dopplers 6. Start ASA 81 mg po qd.  7. Start Lipitor 40 mg po qd 8. Telemetry monitoring 9. Frequent neuro checks 10. Permissive HTN x 24 hours.  11. Management of headache pain with analgesics.  12. ESR and c-reactive protein. May need temporal artery biopsy on the right.   _0  signed: Dr. Kerney Elbe  01/09/2017, 11:33 PM

## 2017-01-09 NOTE — ED Notes (Signed)
Dansie PA at bedside giving update

## 2017-01-10 ENCOUNTER — Observation Stay (HOSPITAL_COMMUNITY): Payer: Self-pay

## 2017-01-10 ENCOUNTER — Observation Stay (HOSPITAL_BASED_OUTPATIENT_CLINIC_OR_DEPARTMENT_OTHER): Payer: Self-pay

## 2017-01-10 ENCOUNTER — Encounter (HOSPITAL_COMMUNITY): Payer: Self-pay | Admitting: Internal Medicine

## 2017-01-10 DIAGNOSIS — H539 Unspecified visual disturbance: Secondary | ICD-10-CM

## 2017-01-10 DIAGNOSIS — F4321 Adjustment disorder with depressed mood: Secondary | ICD-10-CM

## 2017-01-10 DIAGNOSIS — F17210 Nicotine dependence, cigarettes, uncomplicated: Secondary | ICD-10-CM

## 2017-01-10 DIAGNOSIS — I639 Cerebral infarction, unspecified: Secondary | ICD-10-CM

## 2017-01-10 DIAGNOSIS — I6789 Other cerebrovascular disease: Secondary | ICD-10-CM

## 2017-01-10 DIAGNOSIS — Z809 Family history of malignant neoplasm, unspecified: Secondary | ICD-10-CM

## 2017-01-10 DIAGNOSIS — F1411 Cocaine abuse, in remission: Secondary | ICD-10-CM

## 2017-01-10 DIAGNOSIS — Z820 Family history of epilepsy and other diseases of the nervous system: Secondary | ICD-10-CM

## 2017-01-10 DIAGNOSIS — Z8673 Personal history of transient ischemic attack (TIA), and cerebral infarction without residual deficits: Secondary | ICD-10-CM

## 2017-01-10 DIAGNOSIS — Z66 Do not resuscitate: Secondary | ICD-10-CM

## 2017-01-10 DIAGNOSIS — I10 Essential (primary) hypertension: Secondary | ICD-10-CM

## 2017-01-10 DIAGNOSIS — H538 Other visual disturbances: Secondary | ICD-10-CM

## 2017-01-10 DIAGNOSIS — H9319 Tinnitus, unspecified ear: Secondary | ICD-10-CM

## 2017-01-10 LAB — COMPREHENSIVE METABOLIC PANEL
ALBUMIN: 3.9 g/dL (ref 3.5–5.0)
ALK PHOS: 68 U/L (ref 38–126)
ALT: 33 U/L (ref 17–63)
ANION GAP: 12 (ref 5–15)
AST: 22 U/L (ref 15–41)
BUN: 11 mg/dL (ref 6–20)
CHLORIDE: 102 mmol/L (ref 101–111)
CO2: 25 mmol/L (ref 22–32)
Calcium: 8.8 mg/dL — ABNORMAL LOW (ref 8.9–10.3)
Creatinine, Ser: 1.15 mg/dL (ref 0.61–1.24)
GFR calc non Af Amer: >60 mL/min (ref >60)
GLUCOSE: 95 mg/dL (ref 65–99)
POTASSIUM: 3.3 mmol/L — AB (ref 3.5–5.1)
SODIUM: 139 mmol/L (ref 135–145)
Total Bilirubin: 0.5 mg/dL (ref 0.3–1.2)
Total Protein: 5.9 g/dL — ABNORMAL LOW (ref 6.5–8.1)

## 2017-01-10 LAB — CBC
HEMATOCRIT: 43.2 % (ref 39.0–52.0)
HEMOGLOBIN: 15.4 g/dL (ref 13.0–17.0)
MCH: 33.6 pg (ref 26.0–34.0)
MCHC: 35.6 g/dL (ref 30.0–36.0)
MCV: 94.1 fL (ref 78.0–100.0)
Platelets: 235 10*3/uL (ref 150–400)
RBC: 4.59 MIL/uL (ref 4.22–5.81)
RDW: 12.2 % (ref 11.5–15.5)
WBC: 9.1 10*3/uL (ref 4.0–10.5)

## 2017-01-10 LAB — LIPID PANEL
CHOLESTEROL: 169 mg/dL (ref 0–200)
HDL: 23 mg/dL — AB (ref >40)
LDL Cholesterol: 101 mg/dL — ABNORMAL HIGH (ref 0–99)
Total CHOL/HDL Ratio: 7.3 RATIO
Triglycerides: 223 mg/dL — ABNORMAL HIGH (ref <150)
VLDL: 45 mg/dL — ABNORMAL HIGH (ref 0–40)

## 2017-01-10 LAB — GLUCOSE, CAPILLARY: Glucose-Capillary: 103 mg/dL — ABNORMAL HIGH (ref 65–99)

## 2017-01-10 MED ORDER — ONDANSETRON HCL 4 MG PO TABS: 4.0000 mg | ORAL_TABLET | Freq: Four times a day (QID) | ORAL | Status: DC | PRN

## 2017-01-10 MED ORDER — SENNOSIDES-DOCUSATE SODIUM 8.6-50 MG PO TABS
1.0000 | ORAL_TABLET | Freq: Every day | ORAL | Status: DC
  Administered 2017-01-10: 1 via ORAL
  Filled 2017-01-10: qty 1

## 2017-01-10 MED ORDER — ONDANSETRON HCL 4 MG/2ML IJ SOLN: 4.0000 mg | Freq: Four times a day (QID) | INTRAMUSCULAR | Status: DC | PRN

## 2017-01-10 MED ORDER — ASPIRIN 325 MG PO TABS
325.0000 mg | ORAL_TABLET | Freq: Every day | ORAL | Status: DC
  Administered 2017-01-10 (×2): 325 mg via ORAL
  Filled 2017-01-10 (×2): qty 1

## 2017-01-10 MED ORDER — ACETAMINOPHEN 325 MG PO TABS: 650.0000 mg | ORAL_TABLET | Freq: Four times a day (QID) | ORAL | Status: DC | PRN

## 2017-01-10 MED ORDER — ACETAMINOPHEN 650 MG RE SUPP: 650.0000 mg | Freq: Four times a day (QID) | RECTAL | Status: DC | PRN

## 2017-01-10 MED ORDER — HEPARIN SODIUM (PORCINE) 5000 UNIT/ML IJ SOLN
5000.0000 [IU] | Freq: Three times a day (TID) | INTRAMUSCULAR | Status: DC
  Administered 2017-01-10 (×2): 5000 [IU] via SUBCUTANEOUS
  Filled 2017-01-10 (×2): qty 1

## 2017-01-10 MED ORDER — STROKE: EARLY STAGES OF RECOVERY BOOK
Freq: Once | Status: AC
  Administered 2017-01-10: 02:00:00

## 2017-01-10 MED ORDER — ATORVASTATIN CALCIUM 80 MG PO TABS: 80.0000 mg | ORAL_TABLET | Freq: Every day | ORAL | Status: DC

## 2017-01-10 NOTE — Progress Notes (Signed)
  Echocardiogram 2D Echocardiogram has been performed.  Jennette Dubin 01/10/2017, 10:44 AM
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
## 2017-01-10 NOTE — ED Notes (Signed)
Report attempted, RN to call back. 

## 2017-01-10 NOTE — Progress Notes (Signed)
   Subjective: Patient was lying in his bed when seen during rounds today. He states that his headache has improved. No current physical symptoms.   On further questioning, he becomes tearful stating that he has many regrets and conflicts in  life. He was talking about some spiritual enlightening which are helping him to correct his mistakes. He denies any visual or auditory hallucinations. He denies any suicidal or homicidal ideation. He denies any medical assistance, stating that he would like to deal with them spiritually. He also refused to take any prescription medicine.  Objective:  Vital signs in last 24 hours: Vitals:   01/10/17 0103 01/10/17 0315 01/10/17 0500 01/10/17 0655  BP: 134/62 (!) 145/70 (!) 114/54 (!) 106/58  Pulse: 64 (!) 55 60 64  Resp: 15 16 16 16   Temp: 98 F (36.7 C) 97.7 F (36.5 C) 98 F (36.7 C) 98.1 F (36.7 C)  TempSrc: Oral Oral Oral Oral  SpO2: 95% 96% 96% 96%  Weight:   187 lb 13.3 oz (85.2 kg)   Height:       Gen. well-built, well developed man, in no acute distress. Chest. Clear bilaterally. CVS. Regular rate and rhythm, no rub/gallops/murmur. Abdomen. Soft, nondistended, nontender, bowel sounds positive. Extremities. No edema, no cyanosis. Neuro. Alert and oriented. Strength and sensations grossly intact. Psych. Tearful initially, flat affect.  Assessment/Plan:  [name] is a 56 y.o. male with a h/o of prior CVA who presents with 3 wk h/o intermittent HA acutely worsening last night, vision changes, and hot flash which feels similar to prior CVA 2 years ago.   Depression /guilt and grieve reaction. Initially his symptoms were concerning for CVA because of his history of left PICA infarct 2 years ago and other risk factors. His recent workup for any recurrent infarct is negative.  On talking with patient during rounds today, his symptoms are more consistent with some depression versus some guilt or grief reaction. As he states that he  made mistakes in life. Now he is trying to undo those mistakes with  some  spiritual help. He was denying any visual or auditory hallucinations. He might be suffering from some delusional disorder, needs further evaluation by a psychiatrist. -Patient refusing all the medical management and wants to deal spiritually. -We recommended Aspirin and statin for prevention. Patient declined our recommendations.  Decision was made to discharge him according to patient's wishes, he was alert and oriented, was able to make informed decisions properly. He was denying any suicidal or homicidal thoughts.  Dispo: Being discharged today.  Jason Nimrod, MD 01/10/2017, 8:40 AM Pager: [phone]

## 2017-01-10 NOTE — Progress Notes (Signed)
Arrived from ED to 5M10. Alert and oriented. C/O mild headache and blurry vision. Call light within reach.

## 2017-01-10 NOTE — Progress Notes (Signed)
OT Cancellation Note  Patient Details Name: KINTA ASTER MRN: [id number] DOB: 06/22/1961   Cancelled Treatment:    Reason Eval/Treat Not Completed: Patient at procedure or test/ unavailable  [place], OTR/L  [id number] 01/10/2017 01/10/2017, 10:21 AM

## 2017-01-10 NOTE — Progress Notes (Signed)
*  PRELIMINARY RESULTS* Vascular Ultrasound Carotid Duplex (Doppler) has been completed.   Findings suggest 1-39% internal carotid artery stenosis bilaterally. Vertebral arteries are patent with antegrade flow.  01/10/2017 10:11 AM Maudry Mayhew, BS, RVT, RDCS, RDMS

## 2017-01-10 NOTE — H&P (Signed)
Date: 01/10/2017               Patient Name:  Jason Walsh MRN: [id number]  DOB: 1961/10/04 Age / Sex: 56 y.o., male   PCP: No Pcp Per Patient         Medical Service: Internal Medicine Teaching Service         Attending Physician: Dr. Oval Linsey, MD    First Contact: Dr. Reesa Chew Pager: [phone]  Second Contact: Dr. Sherrye Payor Pager: [phone]       After Hours (After 5p/  First Contact Pager: [phone]  weekends / holidays): Second Contact Pager: [phone]   Chief Complaint: HA, vision changes, concern for stroke  History of Present Illness: Jason Walsh is a 56 y.o. male with a h/o of prior CVA who presents with 3 wk h/o intermittent HA acutely worsening last night, vision changes, and hot flash which feels similar to prior CVA 2 years ago.  Previously on review of the medical record, the patient was found to have a cerebral infarct of the Left PICA that was thought to be secondary to tobacco and cocaine abuse. Workup did reveal a small PFO on TTE, but there were no DVTs noted and it was not felt to be clinically significant at the time. No TEE was performed. At that time pt noticed significant stabbing right sided headaches and severe vertigo. Since, the pt has had baseline tinnitus and very subtle vertigo symptoms, as well as chronic dull HA. No weakness or other neuro deficits.  6 months ago, the pt reports that he began to experience some SOB and his dull HAs began to get worse. Then acutely 5 days ago, he began to experience worsening sharp stabbing pains on the right side of his head intermittently. These were accompanied by hyperacusis and worsening of his tinnitus. Yesterday, he noted that he began to have difficulty with blurry vision and today, he reports feeling "flushed" w/ heat rising from his chest to his head. He denies taking any medications for these symptoms at home. He reports that his symptoms are generally relieved with activity as it allows him to take his mind  off things. His two daughters were present in the ED prior to our interview and the patient reports that they were concerned that he may have been slurring his words. Pt denies any difficulty with speaking presently. He does endorse of mental fog and confusion. No weakness or other neuro deficits noted. He denies any visual field deficits.  The patient continues to smoke 2 packs per day, but denies further cocaine use since his last stroke. He has not been compliant w/ antihypertensives, statin, or ASA therapy and takes no medications at home.  In the ED, pt received a CT Head which was negative for acute changes, showing chronic changes surrounding prior stroke. Neurology was consulted and recommended MRI/MRA. Pt reports that he was feeling much improved during our interview from his previous symptoms of HA and tinnitus. He did not require any medication for pain.  Meds: Current Facility-Administered Medications  Medication Dose Route Frequency Provider Last Rate Last Dose  .  stroke: mapping our early stages of recovery book   Does not apply Once Burgess Estelle, MD      . acetaminophen (TYLENOL) tablet 650 mg  650 mg Oral Q6H PRN Burgess Estelle, MD       Or  . acetaminophen (TYLENOL) suppository 650 mg  650 mg Rectal Q6H PRN Burgess Estelle,  MD      . aspirin tablet 325 mg  325 mg Oral Daily Burgess Estelle, MD      . atorvastatin (LIPITOR) tablet 80 mg  80 mg Oral q1800 Holley Raring, MD      . heparin injection 5,000 Units  5,000 Units Subcutaneous Q8H Burgess Estelle, MD      . ondansetron (ZOFRAN) tablet 4 mg  4 mg Oral Q6H PRN Burgess Estelle, MD       Or  . ondansetron (ZOFRAN) injection 4 mg  4 mg Intravenous Q6H PRN Burgess Estelle, MD      . senna-docusate (Senokot-S) tablet 1 tablet  1 tablet Oral QHS Burgess Estelle, MD       No prescriptions prior to admission.   Allergies: Allergies as of 01/09/2017  . (No Known Allergies)   Past Medical History:  Diagnosis Date  . Cancer ([place])    skin   . Lumbar disc disorder   . Stroke [facility])    Family History: Family History  Problem Relation Age of Onset  . Cancer Mother   . Parkinson's disease Father   . Alzheimer's disease Father    Social History: Social History  Substance Use Topics  . Smoking status: Current Every Day Smoker    Packs/day: 1.00    Years: 40.00    Types: Cigarettes    Start date: 02/02/1969  . Smokeless tobacco: Never Used  . Alcohol use 0.0 oz/week     Comment: occasional   Review of Systems: A complete ROS was negative except as per HPI. Review of Systems  Constitutional: Negative for chills, diaphoresis, fever and weight loss.  HENT: Positive for tinnitus. Negative for congestion, ear pain, hearing loss and sinus pain.   Eyes: Positive for blurred vision and redness. Negative for double vision, photophobia, pain and discharge.  Respiratory: Positive for shortness of breath. Negative for cough.   Cardiovascular: Negative for chest pain, palpitations and leg swelling.  Gastrointestinal: Negative for abdominal pain, constipation, diarrhea, nausea and vomiting.  Genitourinary: Negative for dysuria, frequency and urgency.  Musculoskeletal: Negative for myalgias.  Skin: Negative for rash.  Neurological: Positive for sensory change (heat flush), speech change (? slur) and headaches. Negative for dizziness, tingling, tremors, focal weakness, seizures, loss of consciousness and weakness.  Endo/Heme/Allergies: Negative for polydipsia.  Psychiatric/Behavioral: Negative for substance abuse. The patient is not nervous/anxious.     Physical Exam: Vitals:   01/09/17 2200 01/09/17 2215 01/09/17 2230 01/09/17 2315  BP: 130/72 126/94 142/78 121/66  Pulse: 63 62 (!) 53 73  Resp: 24 16 12 13   Temp:      TempSrc:      SpO2: 98% 97% 94% 95%  Weight:      Height:       Physical Exam  Constitutional: He is oriented to person, place, and time. He appears well-developed and well-nourished. He is cooperative. No  distress.  HENT:  Head: Normocephalic and atraumatic.  Right Ear: Hearing normal.  Left Ear: Hearing normal.  Nose: Nose normal.  Mouth/Throat: Mucous membranes are normal.  Eyes: EOM are normal. Pupils are equal, round, and reactive to light. Right conjunctiva is injected. Left conjunctiva is injected. Right eye exhibits no nystagmus. Left eye exhibits no nystagmus.  Neck: Normal range of motion. Neck supple.  Cardiovascular: Normal rate, regular rhythm, S1 normal, S2 normal, normal heart sounds, intact distal pulses and normal pulses.  Exam reveals no gallop.   No murmur heard. Pulmonary/Chest: Effort normal and breath sounds normal.  No respiratory distress. He has no wheezes. He has no rhonchi. He has no rales. He exhibits no tenderness.  Abdominal: Soft. Normal appearance and bowel sounds are normal. He exhibits no ascites. There is no hepatosplenomegaly. There is no tenderness.  Musculoskeletal: He exhibits no edema.  Neurological: He is alert and oriented to person, place, and time. He has normal strength. He displays no atrophy. No cranial nerve deficit or sensory deficit. Coordination normal. GCS eye subscore is 4. GCS verbal subscore is 5. GCS motor subscore is 6.  Skin: Skin is warm, dry and intact. He is not diaphoretic.  Psychiatric: He has a normal mood and affect. His speech is normal and behavior is normal.   Labs: CBC:  Recent Labs Lab 01/09/17 1736 01/09/17 1830  WBC 10.6*  --   NEUTROABS 7.7  --   HGB 16.7 16.3  HCT 46.9 48.0  MCV 94.2  --   PLT 262  --    Basic Metabolic Panel:  Recent Labs Lab 01/09/17 1736 01/09/17 1830  NA 137 140  K 3.6 3.5  CL 103 103  CO2 20*  --   GLUCOSE 208* 206*  BUN 11 12  CREATININE 1.24 1.10  CALCIUM 9.4  --    Cardiac Enzymes:  Recent Labs Lab 01/09/17 1828  TROPIPOC 0.00   Coagulation Studies:  Recent Labs  01/09/17 1736  LABPROT 12.7  INR 0.95   Liver Function Tests:  Recent Labs Lab 01/09/17 1736    AST 30  ALT 39  ALKPHOS 79  BILITOT 0.9  PROT 6.7  ALBUMIN 4.5   CBG: Lab Results  Component Value Date   HGBA1C 5.0 01/04/2015    Recent Labs Lab 01/09/17 1750  GLUCAP 222*    EKG: EKG Interpretation  Date/Time:  Thursday January 09 2017 17:47:18 EST Ventricular Rate:  74 PR Interval:  146 QRS Duration: 98 QT Interval:  412 QTC Calculation: 457 R Axis:   -34 Text Interpretation:  Normal sinus rhythm Left axis deviation Incomplete right bundle branch block Moderate voltage criteria for LVH, may be normal variant Abnormal ECG Confirmed by Hazle Coca [phone]) on 01/09/2017 9:12:18 PM  Imaging: Ct Head Wo Contrast  Result Date: 01/09/2017 CLINICAL DATA:  Right-sided headache.  History of CVA. EXAM: CT HEAD WITHOUT CONTRAST TECHNIQUE: Contiguous axial images were obtained from the base of the skull through the vertex without intravenous contrast. COMPARISON:  01/03/2015 head CT. FINDINGS: Brain: Focus of encephalomalacia in the medial inferior left cerebellar hemisphere. No evidence of parenchymal hemorrhage or extra-axial fluid collection. No mass lesion, mass effect, or midline shift. No CT evidence of acute infarction. Cerebral volume is age appropriate. No ventriculomegaly. Vascular: No hyperdense vessel or unexpected calcification. Skull: No evidence of calvarial fracture. Sinuses/Orbits: The visualized paranasal sinuses are essentially clear. Other:  The mastoid air cells are unopacified. IMPRESSION: 1.  No evidence of acute intracranial abnormality. 2. Focal encephalomalacia in the inferior left cerebellar hemisphere from prior infarct. Electronically Signed   By: Ilona Sorrel M.D.   On: 01/09/2017 18:24   Dg Chest Port 1 View  Result Date: 01/09/2017 CLINICAL DATA:  Headache and feeling flushed today. EXAM: PORTABLE CHEST 1 VIEW COMPARISON:  Chest x-ray dated 06/13/2016. FINDINGS: The heart size and mediastinal contours are within normal limits. Both lungs are clear. The  visualized skeletal structures are unremarkable. IMPRESSION: No active disease.  No evidence of pneumonia or pulmonary edema. Electronically Signed   By: Franki Cabot M.D.   On: 01/09/2017  20:50   Assessment & Plan by Problem: Active Problems:   Vision changes  Jason Walsh is a 56 y.o. male with h/o left PICA stroke and tobacco abuse presents for acute worsening of right sided HA, blurry VA, tinnitus and hyperacusis concerning for CVA.  1) Concern for CVA: pt endorses similar presentation during prior CVA. CT Head was negative for acute changes. Pt with high risk for stroke given tobacco abuse and stroke hx w/o anitplatelet or statin prophylaxis. Currently w/o focal neuro deficits other than blurry VA and tinnitus. HA mild, but has episodic exacerbations over last several days. - admit to tele - HgbA1c, fasting lipid panel - MRI, MRA  of the brain without contrast - PT consult, OT consult, Speech consult - Echocardiogram, consider TEE w/ prior PFO noted. - Carotid dopplers - ASA 81mg  - Atorvastatin 80mg  - Risk factor modification - Frequent neuro checks - tylenol PRN HA  2) HTN: BP normal on prior visits. Allow permissive HTN to 220/120.  3) Tobacco Abuse: offer NRT as appropriate.  DVT PPx - low molecular weight heparin  Code Status - DNR  Consults Placed - Neurology  Dispo: Admit patient to Observation with expected length of stay less than 2 midnights.  Signed: Holley Raring, MD 01/10/2017, 12:19 AM  Pager: [phone]

## 2017-01-10 NOTE — Progress Notes (Signed)
OT Cancellation Note  Patient Details Name: Jason Walsh MRN: [id number] DOB: [birth year]   Cancelled Treatment:    Reason Eval/Treat Not Completed: OT screened, no needs identified, will sign off  [facility], OTR/L  [id number] 01/10/2017 01/10/2017, 10:44 AM

## 2017-01-10 NOTE — Care Management Note (Signed)
Case Management Note  Patient Details  Name: Jason Walsh MRN: [id number] Date of Birth: 03/31/1961  Subjective/Objective:                    Action/Plan: [phone]): Pt discharged home with self care. No f/u and no DME needs per PT. Pt without insurance and no PCP. CM called the patient at home and asked him about the [place] was interested and CM provided him the number to schedule an appointment.   Expected Discharge Date:  01/10/17               Expected Discharge Plan:  Home/Self Care  In-House Referral:     Discharge planning Services  CM Consult  Post Acute Care Choice:    Choice offered to:     DME Arranged:    DME Agency:     HH Arranged:    HH Agency:     Status of Service:  Completed, signed off  If discussed at [employer] of Stay Meetings, dates discussed:    Additional Comments:  Pollie Friar, RN 01/10/2017, 3:12 PM

## 2017-01-10 NOTE — Evaluation (Signed)
Physical Therapy Evaluation Patient Details Name: IZAYAH GUILLORY MRN: [id number] DOB: 20-May-1961 Today's Date: 01/10/2017   History of Present Illness  Pt is a 56 y/o male who presents with a 3 week history of intermittent HA acutely worsening night before admission. CT/MRI negative for acute changes. Pt with a PMH significant for CVA 2 years ago with residual vertigo, tobacco and cocaine use.   Clinical Impression  Patient evaluated by Physical Therapy with no further acute PT needs identified. All education has been completed and the patient has no further questions. At the time of PT eval pt was able to perform transfers and ambulation with gross modified independence to independence. Pt was challenged with higher level balance activity such as simulated stepping over objects, 360 turns, sudden stops, backward walking, and direction changes and did not require any assistance. See below for any follow-up Physical Therapy or equipment needs. PT is signing off. Thank you for this referral.     Follow Up Recommendations No PT follow up (Per pt request)    Equipment Recommendations  None recommended by PT (Discussed shower chair for safety. Pt declined.)    Recommendations for Other Services       Precautions / Restrictions Precautions Precautions: Fall Precaution Comments: Pt with vertigo as a residual effect from last stroke Restrictions Weight Bearing Restrictions: No      Mobility  Bed Mobility Overal bed mobility: Independent             General bed mobility comments: HOB flat, rails lowered to simulate home environment  Transfers Overall transfer level: Independent Equipment used: None Transfers: Sit to/from Stand           General transfer comment: No use of hands required.  Ambulation/Gait Ambulation/Gait assistance: Modified independent (Device/Increase time) Ambulation Distance (Feet): 500 Feet Assistive device: None Gait Pattern/deviations: Step-through  pattern;Decreased stride length;Antalgic;Staggering left;Staggering right Gait velocity: Decreased Gait velocity interpretation: Below normal speed for age/gender General Gait Details: Pt appears unsteady at times however pt reports this is his baseline since his previous stroke. No assist required throughout gait training  Stairs            Wheelchair Mobility    Modified Rankin (Stroke Patients Only)       Balance Overall balance assessment: Modified Independent (Baseline since last stroke per pt)                                           Pertinent Vitals/Pain Pain Assessment: No/denies pain    Home Living Family/patient expects to be discharged to:: Private residence Living Arrangements: Other relatives Available Help at Discharge: Family;Available 24 hours/day (Sister) Type of Home: House Home Access: Level entry     Home Layout: One level Home Equipment: Walker - 2 wheels      Prior Function Level of Independence: Independent         Comments: Pt reports he no longer uses RW     Hand Dominance   Dominant Hand: Right    Extremity/Trunk Assessment                Communication   Communication: No difficulties  Cognition Arousal/Alertness: Awake/alert Behavior During Therapy: WFL for tasks assessed/performed;Restless Overall Cognitive Status: Within Functional Limits for tasks assessed  General Comments      Exercises     Assessment/Plan    PT Assessment Patent does not need any further PT services  PT Problem List            PT Treatment Interventions      PT Goals (Current goals can be found in the Care Plan section)  Acute Rehab PT Goals Patient Stated Goal: Home today PT Goal Formulation: All assessment and education complete, DC therapy    Frequency     Barriers to discharge        Co-evaluation               End of Session Equipment Utilized During Treatment:  Gait belt Activity Tolerance: Patient tolerated treatment well Patient left: with call bell/phone within reach (Sitting EOB) Nurse Communication: Mobility status    Functional Assessment Tool Used: Clinical judgement Functional Limitation: Mobility: Walking and moving around Mobility: Walking and Moving Around Current Status [id number]): At least 1 percent but less than 20 percent impaired, limited or restricted Mobility: Walking and Moving Around Goal Status [phone]): At least 1 percent but less than 20 percent impaired, limited or restricted Mobility: Walking and Moving Around Discharge Status [phone]): At least 1 percent but less than 20 percent impaired, limited or restricted    Time: 1015-1037 PT Time Calculation (min) (ACUTE ONLY): 22 min   Charges:   PT Evaluation $PT Eval Moderate Complexity: 1 Procedure     PT G Codes:   PT G-Codes **NOT FOR INPATIENT CLASS** Functional Assessment Tool Used: Clinical judgement Functional Limitation: Mobility: Walking and moving around Mobility: Walking and Moving Around Current Status [id number]): At least 1 percent but less than 20 percent impaired, limited or restricted Mobility: Walking and Moving Around Goal Status [phone]): At least 1 percent but less than 20 percent impaired, limited or restricted Mobility: Walking and Moving Around Discharge Status [phone]): At least 1 percent but less than 20 percent impaired, limited or restricted    Thelma Comp 01/10/2017, 1:31 PM  Rolinda Roan, PT, DPT Acute Rehabilitation Services Pager: [phone]

## 2017-01-10 NOTE — Progress Notes (Signed)
Discharge Note:  Patient alert and oriented and in no distress.  Patient given discharge instructions regarding signs and symptoms to report, diet, activity, and smoking cessation. Patient was not given information on prescriptions medication as he declined having any medications prescribed for him. Telemetry and peripheral IV discontinued.  Patient confirmed that he had all of his personal belongings. Patient to be transported out via wheelchair by hospital volunteer.

## 2017-01-10 NOTE — Discharge Instructions (Signed)
It was pleasure taking care of you. Your workup done so far is negative for any recent stroke. I am not giving to any prescription as you told us that you do not want to take any medicine, although in our opinion you need some medical help. You can try seeking medical assistance whenever you feel ready for that.

## 2017-01-11 LAB — VAS US CAROTID
LCCADDIAS: 22 cm/s
LCCADSYS: 62 cm/s
LEFT ECA DIAS: -18 cm/s
LEFT VERTEBRAL DIAS: 21 cm/s
LICAPDIAS: -20 cm/s
Left CCA prox dias: 18 cm/s
Left CCA prox sys: 64 cm/s
Left ICA dist dias: -38 cm/s
Left ICA dist sys: -75 cm/s
Left ICA prox sys: -48 cm/s
RCCAPDIAS: -29 cm/s
RIGHT ECA DIAS: -18 cm/s
RIGHT VERTEBRAL DIAS: 18 cm/s
Right CCA prox sys: -80 cm/s
Right cca dist sys: -87 cm/s

## 2017-01-11 LAB — HEMOGLOBIN A1C
Hgb A1c MFr Bld: 5.5 % (ref 4.8–5.6)
Mean Plasma Glucose: 111 mg/dL

## 2017-01-11 LAB — ECHOCARDIOGRAM COMPLETE
HEIGHTINCHES: 69 in
Weight: 3005.31 oz

## 2017-05-23 ENCOUNTER — Emergency Department (HOSPITAL_COMMUNITY): Payer: Worker's Compensation

## 2017-05-23 ENCOUNTER — Emergency Department (HOSPITAL_COMMUNITY)
Admission: EM | Admit: 2017-05-23 | Discharge: 2017-05-23 | Disposition: A | Discharge status: Home / Self Care | Payer: Worker's Compensation | Attending: Emergency Medicine | Admitting: Emergency Medicine

## 2017-05-23 ENCOUNTER — Encounter (HOSPITAL_COMMUNITY): Payer: Self-pay | Admitting: Emergency Medicine

## 2017-05-23 DIAGNOSIS — Y990 Civilian activity done for income or pay: Secondary | ICD-10-CM | POA: Diagnosis not present

## 2017-05-23 DIAGNOSIS — F17210 Nicotine dependence, cigarettes, uncomplicated: Secondary | ICD-10-CM | POA: Insufficient documentation

## 2017-05-23 DIAGNOSIS — Z8673 Personal history of transient ischemic attack (TIA), and cerebral infarction without residual deficits: Secondary | ICD-10-CM | POA: Diagnosis not present

## 2017-05-23 DIAGNOSIS — F1410 Cocaine abuse, uncomplicated: Secondary | ICD-10-CM | POA: Diagnosis not present

## 2017-05-23 DIAGNOSIS — X500XXA Overexertion from strenuous movement or load, initial encounter: Secondary | ICD-10-CM | POA: Insufficient documentation

## 2017-05-23 DIAGNOSIS — Y929 Unspecified place or not applicable: Secondary | ICD-10-CM | POA: Insufficient documentation

## 2017-05-23 DIAGNOSIS — Z85828 Personal history of other malignant neoplasm of skin: Secondary | ICD-10-CM | POA: Insufficient documentation

## 2017-05-23 DIAGNOSIS — Y9389 Activity, other specified: Secondary | ICD-10-CM | POA: Insufficient documentation

## 2017-05-23 DIAGNOSIS — S8991XA Unspecified injury of right lower leg, initial encounter: Secondary | ICD-10-CM | POA: Diagnosis present

## 2017-05-23 DIAGNOSIS — M25461 Effusion, right knee: Secondary | ICD-10-CM | POA: Insufficient documentation

## 2017-05-23 MED ORDER — HYDROCODONE-ACETAMINOPHEN 5-325 MG PO TABS: 1.0000 | ORAL_TABLET | ORAL | 0 refills | Status: DC | PRN

## 2017-05-23 MED ORDER — HYDROCODONE-ACETAMINOPHEN 5-325 MG PO TABS
1.0000 | ORAL_TABLET | Freq: Once | ORAL | Status: AC
  Administered 2017-05-23: 1 via ORAL
  Filled 2017-05-23: qty 1

## 2017-05-23 NOTE — Discharge Instructions (Signed)
Avoid weight bearing and bending the knee as discussed.  Elevation will also help with swelling.  You may apply ice packs, 5 minutes every hour while awake for the next several days is recommended.  You may take the hydrocodone prescribed for pain relief.  This will make you drowsy - do not drive within 4 hours of taking this medication.

## 2017-05-23 NOTE — ED Triage Notes (Addendum)
Patient states he was standing working on a pipe and when he pulled he felt a "pop" in his right knee. Complaining of pain and swelling to right knee since injury on Wednesday. States he took 50 mg tramadol at 0900 today with no relief.

## 2017-05-24 NOTE — ED Provider Notes (Signed)
[place] DEPT Provider Note   CSN: [id number] Arrival date & time: 05/23/17  1043     History   Chief Complaint Chief Complaint  Patient presents with  . Knee Injury    HPI Jason Walsh is a 56 y.o. male presenting with pain and swelling of his right knee which started 2 days ago while working.  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  He felt a sudden popping sensation and has had pain since.  He denies radiation of pain and has had no weakness or numbness distal to the injury site. Pain is worse with weight bearing and flexion.  He has taken tramadol without pain relief.   The history is provided by the patient.    Past Medical History:  Diagnosis Date  . Cancer ([place])    skin  . Lumbar disc disorder   . Stroke [facility])     Patient Active Problem List   Diagnosis Date Noted  . Vision changes 01/10/2017  . Hyperlipidemia 01/04/2015  . Cocaine abuse 01/04/2015  . Unsteady gait 01/04/2015  . Cerebellar infarct ([place]) 01/03/2015  . Tobacco abuse 01/03/2015    Past Surgical History:  Procedure Laterality Date  . NOSE SURGERY         Home Medications    Prior to Admission medications   Medication Sig Start Date End Date Taking? Authorizing Provider  HYDROcodone-acetaminophen (NORCO/VICODIN) 5-325 MG tablet Take 1 tablet by mouth every 4 (four) hours as needed. 05/23/17   Evalee Jefferson, PA-C    Family History Family History  Problem Relation Age of Onset  . Cancer Mother   . Parkinson's disease Father   . Alzheimer's disease Father     Social History Social History  Substance Use Topics  . Smoking status: Current Every Day Smoker    Packs/day: 1.00    Years: 40.00    Types: Cigarettes    Start date: 02/02/1969  . Smokeless tobacco: Never Used  . Alcohol use 0.0 oz/week     Comment: occasional     Allergies   Patient has no known allergies.   Review of Systems Review of  Systems  Constitutional: Negative for fever.  Musculoskeletal: Positive for arthralgias and joint swelling. Negative for myalgias.  Neurological: Negative for weakness and numbness.     Physical Exam Updated Vital Signs BP 140/76   Pulse 70   Temp 98.5 F (36.9 C)   Resp 18   Ht 5\' 9"  (1.753 m)   Wt 88.5 kg (195 lb)   SpO2 98%   BMI 28.80 kg/m   Physical Exam  Constitutional: He appears well-developed and well-nourished.  HENT:  Head: Atraumatic.  Neck: Normal range of motion.  Cardiovascular:  Pulses equal bilaterally  Musculoskeletal: He exhibits tenderness.       Right knee: He exhibits effusion. He exhibits no deformity, no erythema, normal alignment, no LCL laxity and no MCL laxity. Tenderness found. Lateral joint line tenderness noted. No patellar tendon tenderness noted.  Neurological: He is alert. He has normal strength. He displays normal reflexes. No sensory deficit.  Skin: Skin is warm and dry.  Psychiatric: He has a normal mood and affect.     ED Treatments / Results  Labs (all labs ordered are listed, but only abnormal results are displayed) Labs Reviewed - No data to display  EKG  EKG Interpretation None  Radiology Dg Knee Complete 4 Views Right  Result Date: 05/23/2017 CLINICAL DATA:  Pain and swelling.  Difficulty bearing weight. EXAM: RIGHT KNEE - COMPLETE 4+ VIEW COMPARISON:  None. FINDINGS: A moderate-sized joint effusion is present. The knee is located. Chondrocalcinosis is present within the menisci bilaterally. IMPRESSION: 1. Moderate-sized joint effusion without acute or focal osseous abnormality. This could be posttraumatic or related to inflammatory changes including inflammatory arthritis. 2. Chondrocalcinosis of the menisci.  Question CPPD arthropathy. Electronically Signed   By: San Morelle M.D.   On: 05/23/2017 11:33    Procedures Procedures (including critical care time)  Medications Ordered in ED Medications    HYDROcodone-acetaminophen (NORCO/VICODIN) 5-325 MG per tablet 1 tablet (1 tablet Oral Given 05/23/17 1246)     Initial Impression / Assessment and Plan / ED Course  I have reviewed the triage vital signs and the nursing notes.  Pertinent labs & imaging results that were available during my care of the patient were reviewed by me and considered in my medical decision making (see chart for details).     Imaging reviewed.  Hx suggesting traumatic, unlikely infectious.  He has no prior h/o knee pain.  Ice, elevation, crutches, knee immobilizer provided.  Hydrocodone, ortho f/u.   Final Clinical Impressions(s) / ED Diagnoses   Final diagnoses:  Effusion of right knee    New Prescriptions Discharge Medication List as of 05/23/2017 12:35 PM    START taking these medications   Details  HYDROcodone-acetaminophen (NORCO/VICODIN) 5-325 MG tablet Take 1 tablet by mouth every 4 (four) hours as needed., Starting Fri 05/23/2017, Print         Evalee Jefferson, PA-C 05/24/17 1412    Long, Wonda [facility], MD 05/25/17 2129

## 2017-05-26 ENCOUNTER — Telehealth: Payer: Self-pay | Admitting: Orthopedic Surgery

## 2017-05-26 NOTE — Telephone Encounter (Signed)
Patient called wanting to schedule appt to see Dr. Aline Brochure. Was seen in the ER.  DOI was 05-21-17 and was a Workman's Comp injury to the R knee.  Patient was instructed to have the adjuster call for the appt.  Information provided by patient:   DOI 05-21-17 INJURY:  R knee (popped while using as leverage) EMPLOYER:  [employer]  COMPANY OWNER:  Jola Babinski SUPERVISOR:  Billey Gosling OFFICE MANAGER:  Dorian Pod  The injury was reported to above mentioned per patient.

## 2017-05-27 NOTE — Telephone Encounter (Signed)
05/27/17 called back to patient to check on status of Workers comp; states he was contacted by an Research scientist (physical sciences), and was told that he will be notified of further information as to a network provider in their system. Patient understands the referral may not be made to [name]. Appointment pending.

## 2017-06-03 NOTE — Telephone Encounter (Signed)
Called patient to follow up - states had been set up through Workers comp with [employer], and had the appointment there yesterday, 06/02/17. Thanked [name] for following up.

## 2017-07-09 ENCOUNTER — Ambulatory Visit: Payer: Self-pay | Admitting: Orthopedic Surgery

## 2017-07-10 ENCOUNTER — Inpatient Hospital Stay (HOSPITAL_COMMUNITY): Payer: Worker's Compensation | Admitting: Anesthesiology

## 2017-07-10 ENCOUNTER — Encounter (HOSPITAL_COMMUNITY): Payer: Self-pay | Admitting: *Deleted

## 2017-07-10 ENCOUNTER — Encounter (HOSPITAL_COMMUNITY)
Admission: RE | Disposition: A | Discharge status: Home Health | Payer: Self-pay | Source: Ambulatory Visit | Attending: Specialist

## 2017-07-10 ENCOUNTER — Inpatient Hospital Stay (HOSPITAL_COMMUNITY)
Admission: RE | Admit: 2017-07-10 | Discharge: 2017-07-14 | DRG: 489 | Disposition: A | Discharge status: Home Health | Payer: Worker's Compensation | Source: Ambulatory Visit | Attending: Specialist | Admitting: Specialist

## 2017-07-10 DIAGNOSIS — Z886 Allergy status to analgesic agent status: Secondary | ICD-10-CM

## 2017-07-10 DIAGNOSIS — Z8673 Personal history of transient ischemic attack (TIA), and cerebral infarction without residual deficits: Secondary | ICD-10-CM

## 2017-07-10 DIAGNOSIS — M009 Pyogenic arthritis, unspecified: Principal | ICD-10-CM

## 2017-07-10 DIAGNOSIS — Z9889 Other specified postprocedural states: Secondary | ICD-10-CM

## 2017-07-10 DIAGNOSIS — F17210 Nicotine dependence, cigarettes, uncomplicated: Secondary | ICD-10-CM | POA: Diagnosis present

## 2017-07-10 DIAGNOSIS — Z85828 Personal history of other malignant neoplasm of skin: Secondary | ICD-10-CM

## 2017-07-10 HISTORY — PX: IRRIGATION AND DEBRIDEMENT KNEE: SHX5185

## 2017-07-10 LAB — SYNOVIAL CELL COUNT + DIFF, W/ CRYSTALS
Crystals, Fluid: NONE SEEN
LYMPHOCYTES-SYNOVIAL FLD: 1 % (ref 0–20)
MONOCYTE-MACROPHAGE-SYNOVIAL FLUID: 5 % — AB (ref 50–90)
Neutrophil, Synovial: 94 % — ABNORMAL HIGH (ref 0–25)
WBC, Synovial: 15540 /mm3 — ABNORMAL HIGH (ref 0–200)

## 2017-07-10 SURGERY — IRRIGATION AND DEBRIDEMENT KNEE
Anesthesia: General | Site: Knee | Laterality: Right

## 2017-07-10 MED ORDER — ONDANSETRON HCL 4 MG PO TABS: 4.0000 mg | ORAL_TABLET | Freq: Four times a day (QID) | ORAL | Status: DC | PRN

## 2017-07-10 MED ORDER — BUPIVACAINE-EPINEPHRINE 0.25% -1:200000 IJ SOLN
INTRAMUSCULAR | Status: AC
  Filled 2017-07-10: qty 1

## 2017-07-10 MED ORDER — GABAPENTIN 300 MG PO CAPS
300.0000 mg | ORAL_CAPSULE | Freq: Once | ORAL | Status: AC
  Administered 2017-07-10: 300 mg via ORAL

## 2017-07-10 MED ORDER — ONDANSETRON HCL 4 MG/2ML IJ SOLN
INTRAMUSCULAR | Status: DC | PRN
  Administered 2017-07-10: 4 mg via INTRAVENOUS

## 2017-07-10 MED ORDER — ACETAMINOPHEN 500 MG PO TABS
1000.0000 mg | ORAL_TABLET | Freq: Once | ORAL | Status: AC
  Administered 2017-07-10: 1000 mg via ORAL

## 2017-07-10 MED ORDER — FENTANYL CITRATE (PF) 100 MCG/2ML IJ SOLN
INTRAMUSCULAR | Status: DC | PRN
Start: 2017-07-10 — End: 2017-07-10
  Administered 2017-07-10 (×2): 50 ug via INTRAVENOUS

## 2017-07-10 MED ORDER — FENTANYL CITRATE (PF) 100 MCG/2ML IJ SOLN
INTRAMUSCULAR | Status: AC
Start: 2017-07-10 — End: 2017-07-11
  Filled 2017-07-10: qty 2

## 2017-07-10 MED ORDER — HYDROMORPHONE HCL-NACL 0.5-0.9 MG/ML-% IV SOSY: 1.0000 mg | PREFILLED_SYRINGE | INTRAVENOUS | Status: DC | PRN

## 2017-07-10 MED ORDER — DOCUSATE SODIUM 100 MG PO CAPS
100.0000 mg | ORAL_CAPSULE | Freq: Two times a day (BID) | ORAL | Status: DC
  Administered 2017-07-10 – 2017-07-14 (×8): 100 mg via ORAL
  Filled 2017-07-10 (×8): qty 1

## 2017-07-10 MED ORDER — MIDAZOLAM HCL 2 MG/2ML IJ SOLN
INTRAMUSCULAR | Status: AC
  Filled 2017-07-10: qty 2

## 2017-07-10 MED ORDER — METOCLOPRAMIDE HCL 5 MG PO TABS: 5.0000 mg | ORAL_TABLET | Freq: Three times a day (TID) | ORAL | Status: DC | PRN

## 2017-07-10 MED ORDER — ONDANSETRON HCL 4 MG/2ML IJ SOLN
INTRAMUSCULAR | Status: AC
  Filled 2017-07-10: qty 2

## 2017-07-10 MED ORDER — ACETAMINOPHEN 500 MG PO TABS
ORAL_TABLET | ORAL | Status: AC
  Filled 2017-07-10: qty 2

## 2017-07-10 MED ORDER — LACTATED RINGERS IR SOLN
Status: DC | PRN
Start: 2017-07-10 — End: 2017-07-10
  Administered 2017-07-10: 12000 mL

## 2017-07-10 MED ORDER — FENTANYL CITRATE (PF) 100 MCG/2ML IJ SOLN
INTRAMUSCULAR | Status: AC
  Filled 2017-07-10: qty 2

## 2017-07-10 MED ORDER — PROMETHAZINE HCL 25 MG/ML IJ SOLN: 6.2500 mg | INTRAMUSCULAR | Status: DC | PRN

## 2017-07-10 MED ORDER — VANCOMYCIN HCL IN DEXTROSE 1-5 GM/200ML-% IV SOLN
1000.0000 mg | INTRAVENOUS | Status: AC
  Administered 2017-07-10: 1000 mg via INTRAVENOUS
  Filled 2017-07-10: qty 200

## 2017-07-10 MED ORDER — FENTANYL CITRATE (PF) 100 MCG/2ML IJ SOLN
25.0000 ug | INTRAMUSCULAR | Status: DC | PRN
  Administered 2017-07-10 (×2): 25 ug via INTRAVENOUS
  Administered 2017-07-10 (×2): 50 ug via INTRAVENOUS

## 2017-07-10 MED ORDER — ASPIRIN EC 325 MG PO TBEC
325.0000 mg | DELAYED_RELEASE_TABLET | Freq: Two times a day (BID) | ORAL | Status: DC
  Administered 2017-07-11 – 2017-07-14 (×7): 325 mg via ORAL
  Filled 2017-07-10 (×8): qty 1

## 2017-07-10 MED ORDER — DEXAMETHASONE SODIUM PHOSPHATE 10 MG/ML IJ SOLN
INTRAMUSCULAR | Status: DC | PRN
  Administered 2017-07-10: 10 mg via INTRAVENOUS

## 2017-07-10 MED ORDER — METHOCARBAMOL 500 MG PO TABS
500.0000 mg | ORAL_TABLET | Freq: Four times a day (QID) | ORAL | Status: DC | PRN
  Administered 2017-07-10 – 2017-07-11 (×2): 500 mg via ORAL
  Filled 2017-07-10 (×2): qty 1

## 2017-07-10 MED ORDER — VANCOMYCIN HCL IN DEXTROSE 1-5 GM/200ML-% IV SOLN
1000.0000 mg | Freq: Two times a day (BID) | INTRAVENOUS | Status: AC
  Administered 2017-07-11 (×2): 1000 mg via INTRAVENOUS
  Filled 2017-07-10 (×2): qty 200

## 2017-07-10 MED ORDER — PROPOFOL 10 MG/ML IV BOLUS
INTRAVENOUS | Status: AC
  Filled 2017-07-10: qty 20

## 2017-07-10 MED ORDER — ACETAMINOPHEN 325 MG PO TABS
650.0000 mg | ORAL_TABLET | Freq: Four times a day (QID) | ORAL | Status: DC | PRN
Start: 2017-07-10 — End: 2017-07-14

## 2017-07-10 MED ORDER — HYDROCODONE-ACETAMINOPHEN 7.5-325 MG PO TABS
1.0000 | ORAL_TABLET | ORAL | Status: DC | PRN
  Administered 2017-07-10 (×2): 1 via ORAL
  Administered 2017-07-11 – 2017-07-14 (×13): 2 via ORAL
  Filled 2017-07-10 (×7): qty 2
  Filled 2017-07-10: qty 1
  Filled 2017-07-10: qty 2
  Filled 2017-07-10: qty 1
  Filled 2017-07-10 (×5): qty 2

## 2017-07-10 MED ORDER — LACTATED RINGERS IV SOLN
INTRAVENOUS | Status: DC
  Administered 2017-07-10 – 2017-07-14 (×6): via INTRAVENOUS

## 2017-07-10 MED ORDER — BUPIVACAINE HCL 0.25 % IJ SOLN
INTRAMUSCULAR | Status: DC | PRN
  Administered 2017-07-10: 20 mL

## 2017-07-10 MED ORDER — METOCLOPRAMIDE HCL 5 MG/ML IJ SOLN: 5.0000 mg | Freq: Three times a day (TID) | INTRAMUSCULAR | Status: DC | PRN

## 2017-07-10 MED ORDER — DIPHENHYDRAMINE HCL 12.5 MG/5ML PO ELIX: 12.5000 mg | ORAL_SOLUTION | ORAL | Status: DC | PRN

## 2017-07-10 MED ORDER — ACETAMINOPHEN 650 MG RE SUPP: 650.0000 mg | Freq: Four times a day (QID) | RECTAL | Status: DC | PRN

## 2017-07-10 MED ORDER — LIDOCAINE 2% (20 MG/ML) 5 ML SYRINGE
INTRAMUSCULAR | Status: AC
  Filled 2017-07-10: qty 5

## 2017-07-10 MED ORDER — EPHEDRINE 5 MG/ML INJ
INTRAVENOUS | Status: AC
  Filled 2017-07-10: qty 10

## 2017-07-10 MED ORDER — EPHEDRINE SULFATE-NACL 50-0.9 MG/10ML-% IV SOSY
PREFILLED_SYRINGE | INTRAVENOUS | Status: DC | PRN
  Administered 2017-07-10: 10 mg via INTRAVENOUS
  Administered 2017-07-10: 5 mg via INTRAVENOUS

## 2017-07-10 MED ORDER — GABAPENTIN 300 MG PO CAPS
ORAL_CAPSULE | ORAL | Status: AC
  Filled 2017-07-10: qty 1

## 2017-07-10 MED ORDER — PROPOFOL 10 MG/ML IV BOLUS
INTRAVENOUS | Status: DC | PRN
  Administered 2017-07-10: 170 mg via INTRAVENOUS

## 2017-07-10 MED ORDER — METHOCARBAMOL 1000 MG/10ML IJ SOLN
500.0000 mg | Freq: Four times a day (QID) | INTRAVENOUS | Status: DC | PRN
  Filled 2017-07-10: qty 5

## 2017-07-10 MED ORDER — ONDANSETRON HCL 4 MG/2ML IJ SOLN: 4.0000 mg | Freq: Four times a day (QID) | INTRAMUSCULAR | Status: DC | PRN

## 2017-07-10 MED ORDER — LIDOCAINE 2% (20 MG/ML) 5 ML SYRINGE
INTRAMUSCULAR | Status: DC | PRN
  Administered 2017-07-10: 80 mg via INTRAVENOUS

## 2017-07-10 MED ORDER — CHLORHEXIDINE GLUCONATE 4 % EX LIQD
60.0000 mL | Freq: Once | CUTANEOUS | Status: DC
  Filled 2017-07-10: qty 60

## 2017-07-10 SURGICAL SUPPLY — 35 items
BANDAGE ACE 6X5 VEL STRL LF (GAUZE/BANDAGES/DRESSINGS) ×3 IMPLANT
BLADE GREAT WHITE 4.2 (BLADE) ×1 IMPLANT
BLADE GREAT WHITE 4.2MM (BLADE)
COVER MAYO STAND STRL (DRAPES) ×2 IMPLANT
COVER SURGICAL LIGHT HANDLE (MISCELLANEOUS) ×3 IMPLANT
CUFF TOURN SGL QUICK 34 (TOURNIQUET CUFF) ×3
CUFF TRNQT CYL 34X4X40X1 (TOURNIQUET CUFF) ×1 IMPLANT
DECANTER SPIKE VIAL GLASS SM (MISCELLANEOUS) ×3 IMPLANT
DRAPE INCISE IOBAN 66X45 STRL (DRAPES) ×3 IMPLANT
DRSG PAD ABDOMINAL 8X10 ST (GAUZE/BANDAGES/DRESSINGS) ×6 IMPLANT
DRSG XEROFORM 1X8 (GAUZE/BANDAGES/DRESSINGS) ×2 IMPLANT
DURAPREP 26ML APPLICATOR (WOUND CARE) ×3 IMPLANT
EVACUATOR 1/8 PVC DRAIN (DRAIN) ×2 IMPLANT
GAUZE SPONGE 4X4 12PLY STRL (GAUZE/BANDAGES/DRESSINGS) ×3 IMPLANT
GAUZE XEROFORM 1X8 LF (GAUZE/BANDAGES/DRESSINGS) ×3 IMPLANT
GLOVE BIOGEL PI IND STRL 8 (GLOVE) ×2 IMPLANT
GLOVE BIOGEL PI INDICATOR 8 (GLOVE) ×4
GLOVE SURG ORTHO 8.0 STRL STRW (GLOVE) ×3 IMPLANT
GLOVE SURG SS PI 7.5 STRL IVOR (GLOVE) ×3 IMPLANT
GOWN STRL REUS W/TWL XL LVL3 (GOWN DISPOSABLE) ×6 IMPLANT
IMMOBILIZER KNEE 22 UNIV (SOFTGOODS) ×2 IMPLANT
KIT BASIN OR (CUSTOM PROCEDURE TRAY) ×3 IMPLANT
MANIFOLD NEPTUNE II (INSTRUMENTS) ×3 IMPLANT
MINI VAC (SURGICAL WAND) IMPLANT
NEEDLE HYPO 22GX1.5 SAFETY (NEEDLE) ×3 IMPLANT
PACK ARTHROSCOPY WL (CUSTOM PROCEDURE TRAY) ×3 IMPLANT
PAD ABD 7.5X8 STRL (GAUZE/BANDAGES/DRESSINGS) ×4 IMPLANT
PAD MASON LEG HOLDER (PIN) ×3 IMPLANT
SET ARTHROSCOPY TUBING (MISCELLANEOUS) ×3
SET ARTHROSCOPY TUBING LN (MISCELLANEOUS) IMPLANT
SUT ETHILON 4 0 PS 2 18 (SUTURE) ×3 IMPLANT
SYR CONTROL 10ML LL (SYRINGE) ×3 IMPLANT
TOWEL OR 17X26 10 PK STRL BLUE (TOWEL DISPOSABLE) ×3 IMPLANT
TUBING ARTHRO INFLOW-ONLY STRL (TUBING) ×3 IMPLANT
WRAP KNEE MAXI GEL POST OP (GAUZE/BANDAGES/DRESSINGS) ×3 IMPLANT

## 2017-07-10 NOTE — Anesthesia Procedure Notes (Signed)
Procedure Name: LMA Insertion Date/Time: 07/10/2017 5:50 PM Performed by: Montel Clock Pre-anesthesia Checklist: Patient identified, Emergency Drugs available, Suction available, Patient being monitored and Timeout performed Patient Re-evaluated:Patient Re-evaluated prior to induction Oxygen Delivery Method: Circle system utilized Preoxygenation: Pre-oxygenation with 100% oxygen Induction Type: IV induction LMA: LMA with gastric port inserted LMA Size: 4.0 Number of attempts: 1 Dental Injury: Teeth and Oropharynx as per pre-operative assessment

## 2017-07-10 NOTE — Op Note (Signed)
Dictated#  preop diagnosis probable septic arthritis right knee Postoperative diagnosis same versus crystalline arthritis Procedure right knee arthroscopic irrigation debridement. Surgeon Hart Robinsons M.D. Asst. Jerilynn Mages PA-C Anesthesia Gen. Estimated blood loss minimal Drains none Drains one medium Hemovac Cup case and none Disposition PACU stable Operative findings thick brownish fluid initially quite a bit of femoris exudate intra-articular. Particular cartilage healthy at this point time. Procedure Patient counseling holding area cracks I was identified March signed appropriately IV of vancomycin (taken to the operating room placed supine position under general anesthesia right little stretch placed in thigh holder prepped with DuraPrep and draped in a sterile fashion the extremity was not exsanguinated tourniquet was utilized. circumferentially exam of the lower extremities to like a fatty trace benefits of other infestations and could not. we placed the arthroscopic cannulas proximal medial inferomedial inferolateral distal and inferolateral to the point to where was copiously irrigated within the knee. this irrigated 12 l of fluid. the supracondylar fibrinous exudate in the knee is removed meticulously with shaver. the articular cartilage be healthy there is no a that amount is noted 3 compartments. copious irrigation portal inferomedial inferolateral closed with nylon a medium hemovac drain was placed the posteromedial cannula intra-articular and then it was sewn in place and suctionaced dressings applied. Compressive dressings applied home patient was awakened taken from the operating room to PACU in stable condition

## 2017-07-10 NOTE — Anesthesia Postprocedure Evaluation (Signed)
Anesthesia Post Note  Patient: NIMROD WENDT  Procedure(s) Performed: Procedure(s) (LRB): RIGHT KNEE SCOPE IRRIGATION AND DEBRIDEMENT (Right)     Patient location during evaluation: PACU Anesthesia Type: General Level of consciousness: awake and alert Pain management: pain level controlled Vital Signs Assessment: post-procedure vital signs reviewed and stable Respiratory status: spontaneous breathing, nonlabored ventilation and respiratory function stable Cardiovascular status: blood pressure returned to baseline and stable Postop Assessment: no signs of nausea or vomiting Anesthetic complications: no    Last Vitals:  Vitals:   07/10/17 1930 07/10/17 1945  BP: (!) 149/77 (!) 146/74  Pulse: 66 65  Resp: 12 12  Temp:  (!) 36.4 C    Last Pain:  Vitals:   07/10/17 1945  TempSrc:   PainSc: 2                  Catalina Gravel

## 2017-07-10 NOTE — Anesthesia Preprocedure Evaluation (Addendum)
Anesthesia Evaluation  Patient identified by MRN, date of birth, ID band Patient awake    Reviewed: Allergy & Precautions, NPO status , Patient's Chart, lab work & pertinent test results  Airway Mallampati: II  TM Distance: >3 FB Neck ROM: Full    Dental  (+) Dental Advisory Given, Edentulous Upper, Edentulous Lower   Pulmonary Current Smoker,    Pulmonary exam normal breath sounds clear to auscultation       Cardiovascular Normal cardiovascular exam Rhythm:Regular Rate:Normal  Echo 01/10/17: Study Conclusions  - Left ventricle: The cavity size was normal. Systolic function was normal. The estimated ejection fraction was 50%. Wall motion was normal; there were no regional wall motion abnormalities. Left ventricular diastolic function parameters were normal. - Aortic valve: Trileaflet; mildly thickened, mildly calcified   leaflets. - Mitral valve: Valve area by pressure half-time: 2.32 cm^2. - Pulmonary arteries: Systolic pressure could not be accurately estimated.   Neuro/Psych CVA, No Residual Symptoms negative psych ROS   GI/Hepatic negative GI ROS, (+)     substance abuse  cocaine use,   Endo/Other  negative endocrine ROS  Renal/GU negative Renal ROS     Musculoskeletal  Right knee septic arthritis   Abdominal   Peds  Hematology negative hematology ROS (+)   Anesthesia Other Findings Day of surgery medications reviewed with the patient.  Reproductive/Obstetrics                          Anesthesia Physical Anesthesia Plan  ASA: III  Anesthesia Plan: General   Post-op Pain Management:    Induction: Intravenous  PONV Risk Score and Plan: 2 and Ondansetron and Dexamethasone  Airway Management Planned: LMA  Additional Equipment:   Intra-op Plan:   Post-operative Plan: Extubation in OR  Informed Consent: I have reviewed the patients History and Physical, chart, labs and  discussed the procedure including the risks, benefits and alternatives for the proposed anesthesia with the patient or authorized representative who has indicated his/her understanding and acceptance.   Dental advisory given  Plan Discussed with: CRNA  Anesthesia Plan Comments: (Risks/benefits of general anesthesia discussed with patient including risk of damage to teeth, lips, gum, and tongue, nausea/vomiting, allergic reactions to medications, and the possibility of heart attack, stroke and death.  All patient questions answered.  Patient wishes to proceed.)        Anesthesia Quick Evaluation

## 2017-07-10 NOTE — H&P (Signed)
Jason Walsh is an 56 y.o. male.   Chief Complaint: Right knee pain HPI: Patient presents with joint discomfort that had been persistent. Folllowing right arthroscopy a weeks ago. On keflex following scope. He was aspirated in office last week and labs were sent. WBC from aspirate >60000. Poisitive ESR and C-reactive protein.  Cultures pending and gram stain negative. He was then aspirated again yesterday. Similar fluid as obtained.  Patient still reports pain medially and laterally. It does radiate up to his groin in the medial side. Surgical treatments were discussed in detail. Patient wishes to proceed with surgery as consented. Denies SOB, CP, or calf pain. No  nausea/ vomiting.   Past Medical History:  Diagnosis Date  . Cancer ([place])    skin  . Lumbar disc disorder   . Stroke [facility])     Past Surgical History:  Procedure Laterality Date  . NOSE SURGERY      Family History  Problem Relation Age of Onset  . Cancer Mother   . Parkinson's disease Father   . Alzheimer's disease Father    Social History:  reports that he has been smoking Cigarettes.  He started smoking about 48 years ago. He has a 40.00 pack-year smoking history. He has never used smokeless tobacco. He reports that he drinks alcohol. He reports that he does not use drugs.  Allergies:  Allergies  Allergen Reactions  . Ibuprofen Other (See Comments)    Chest pains    Medications Prior to Admission  Medication Sig Dispense Refill  . doxycycline (VIBRA-TABS) 100 MG tablet Take 100 mg by mouth daily. Started 07/08/2017 - 7 day supply    . HYDROcodone-acetaminophen (NORCO/VICODIN) 5-325 MG tablet Take 1 tablet by mouth every 4 (four) hours as needed. 20 tablet 0    No results found for this or any previous visit (from the past 48 hour(s)). No results found.  Review of Systems  Constitutional: Positive for fever. Negative for weight loss.  HENT: Negative.   Eyes: Negative.   Respiratory: Negative.  Negative for  shortness of breath.   Cardiovascular: Negative.  Negative for chest pain.  Gastrointestinal: Negative.   Genitourinary: Negative.   Musculoskeletal: Positive for joint pain.  Skin: Negative.   Neurological: Negative.   Endo/Heme/Allergies: Negative.   Psychiatric/Behavioral: Negative.     Blood pressure 126/71, pulse 82, temperature 99.2 F (37.3 C), temperature source Oral, resp. rate 20, height _0  (1.753 m), weight 83.5 kg (184 lb), SpO2 100 %. Physical Exam  Constitutional: He is oriented to person, place, and time. He appears well-developed.  HENT:  Head: Normocephalic.  Eyes: EOM are normal.  Neck: Normal range of motion.  Cardiovascular: Intact distal pulses.   Respiratory: Effort normal.  GI: Soft.  Genitourinary:  Genitourinary Comments: Deferred  Musculoskeletal:  Right knee effusion and minimal redness. No drainage from incisions.  Limited ROM. Calf soft and non tender. 2+ pedal pulse.  Neurological: He is alert and oriented to person, place, and time.  Skin: Skin is warm and dry.  Psychiatric: His behavior is normal.     Assessment/Plan Right knee septic arthritis: Right knee arthroscopy and I&D today Cultures ID consult Admit for IV ABX Monitor labs    Lajean Manes, PA-C 07/10/2017, 2:09 PM

## 2017-07-10 NOTE — Transfer of Care (Signed)
Immediate Anesthesia Transfer of Care Note  Patient: Jason Walsh  Procedure(s) Performed: Procedure(s): RIGHT KNEE SCOPE IRRIGATION AND DEBRIDEMENT (Right)  Patient Location: PACU  Anesthesia Type:General  Level of Consciousness:  sedated, patient cooperative and responds to stimulation  Airway & Oxygen Therapy:Patient Spontanous Breathing and Patient connected to face mask oxgen  Post-op Assessment:  Report given to PACU RN and Post -op Vital signs reviewed and stable  Post vital signs:  Reviewed and stable  Last Vitals:  Vitals:   07/10/17 1406  BP: 126/71  Pulse: 82  Resp: 20  Temp: 14.4 C    Complications: No apparent anesthesia complications

## 2017-07-11 ENCOUNTER — Encounter (HOSPITAL_COMMUNITY): Payer: Self-pay | Admitting: Specialist

## 2017-07-11 DIAGNOSIS — Z8673 Personal history of transient ischemic attack (TIA), and cerebral infarction without residual deficits: Secondary | ICD-10-CM | POA: Diagnosis not present

## 2017-07-11 DIAGNOSIS — Z888 Allergy status to other drugs, medicaments and biological substances status: Secondary | ICD-10-CM | POA: Diagnosis not present

## 2017-07-11 DIAGNOSIS — M009 Pyogenic arthritis, unspecified: Principal | ICD-10-CM

## 2017-07-11 DIAGNOSIS — Z810 Family history of intellectual disabilities: Secondary | ICD-10-CM | POA: Diagnosis not present

## 2017-07-11 DIAGNOSIS — M25461 Effusion, right knee: Secondary | ICD-10-CM | POA: Diagnosis not present

## 2017-07-11 DIAGNOSIS — Z85828 Personal history of other malignant neoplasm of skin: Secondary | ICD-10-CM

## 2017-07-11 DIAGNOSIS — Z8269 Family history of other diseases of the musculoskeletal system and connective tissue: Secondary | ICD-10-CM | POA: Diagnosis not present

## 2017-07-11 DIAGNOSIS — Z9889 Other specified postprocedural states: Secondary | ICD-10-CM | POA: Diagnosis not present

## 2017-07-11 DIAGNOSIS — F17210 Nicotine dependence, cigarettes, uncomplicated: Secondary | ICD-10-CM

## 2017-07-11 DIAGNOSIS — M25561 Pain in right knee: Secondary | ICD-10-CM

## 2017-07-11 DIAGNOSIS — Z809 Family history of malignant neoplasm, unspecified: Secondary | ICD-10-CM

## 2017-07-11 DIAGNOSIS — Z886 Allergy status to analgesic agent status: Secondary | ICD-10-CM | POA: Diagnosis not present

## 2017-07-11 LAB — BASIC METABOLIC PANEL
Anion gap: 8 (ref 5–15)
BUN: 21 mg/dL — AB (ref 6–20)
CHLORIDE: 102 mmol/L (ref 101–111)
CO2: 29 mmol/L (ref 22–32)
Calcium: 9 mg/dL (ref 8.9–10.3)
Creatinine, Ser: 0.95 mg/dL (ref 0.61–1.24)
GFR calc Af Amer: >60 mL/min (ref >60)
GLUCOSE: 208 mg/dL — AB (ref 65–99)
POTASSIUM: 4.4 mmol/L (ref 3.5–5.1)
Sodium: 139 mmol/L (ref 135–145)

## 2017-07-11 LAB — CBC WITH DIFFERENTIAL/PLATELET
BASOS ABS: 0 10*3/uL (ref 0.0–0.1)
BASOS PCT: 0 %
EOS ABS: 0 10*3/uL (ref 0.0–0.7)
EOS PCT: 0 %
HCT: 35.4 % — ABNORMAL LOW (ref 39.0–52.0)
HEMOGLOBIN: 12.4 g/dL — AB (ref 13.0–17.0)
LYMPHS ABS: 0.7 10*3/uL (ref 0.7–4.0)
Lymphocytes Relative: 8 %
MCH: 33.6 pg (ref 26.0–34.0)
MCHC: 35 g/dL (ref 30.0–36.0)
MCV: 95.9 fL (ref 78.0–100.0)
Monocytes Absolute: 0.2 10*3/uL (ref 0.1–1.0)
Monocytes Relative: 3 %
NEUTROS PCT: 89 %
Neutro Abs: 8.6 10*3/uL — ABNORMAL HIGH (ref 1.7–7.7)
PLATELETS: 292 10*3/uL (ref 150–400)
RBC: 3.69 MIL/uL — AB (ref 4.22–5.81)
RDW: 12.7 % (ref 11.5–15.5)
WBC: 9.5 10*3/uL (ref 4.0–10.5)

## 2017-07-11 LAB — SEDIMENTATION RATE: SED RATE: 78 mm/h — AB (ref 0–16)

## 2017-07-11 LAB — C-REACTIVE PROTEIN: CRP: 15.8 mg/dL — ABNORMAL HIGH (ref <1.0)

## 2017-07-11 MED ORDER — SODIUM CHLORIDE 0.9% FLUSH: 10.0000 mL | INTRAVENOUS | Status: DC | PRN

## 2017-07-11 MED ORDER — ASPIRIN 325 MG PO TBEC: 325.0000 mg | DELAYED_RELEASE_TABLET | Freq: Two times a day (BID) | ORAL | 0 refills | Status: DC

## 2017-07-11 MED ORDER — POLYETHYLENE GLYCOL 3350 17 G PO PACK
17.0000 g | PACK | Freq: Two times a day (BID) | ORAL | Status: DC
  Administered 2017-07-11 – 2017-07-14 (×6): 17 g via ORAL
  Filled 2017-07-11 (×6): qty 1

## 2017-07-11 MED ORDER — HYDROCODONE-ACETAMINOPHEN 7.5-325 MG PO TABS: 1.0000 | ORAL_TABLET | ORAL | 0 refills | Status: DC | PRN

## 2017-07-11 MED ORDER — BISACODYL 10 MG RE SUPP
10.0000 mg | Freq: Every day | RECTAL | Status: DC | PRN
  Administered 2017-07-11: 13:00:00 10 mg via RECTAL
  Filled 2017-07-11: qty 1

## 2017-07-11 NOTE — Progress Notes (Signed)
Peripherally Inserted Central Catheter/Midline Placement  The IV Nurse has discussed with the patient and/or persons authorized to consent for the patient, the purpose of this procedure and the potential benefits and risks involved with this procedure.  The benefits include less needle sticks, lab draws from the catheter, and the patient may be discharged home with the catheter. Risks include, but not limited to, infection, bleeding, blood clot (thrombus formation), and puncture of an artery; nerve damage and irregular heartbeat and possibility to perform a PICC exchange if needed/ordered by physician.  Alternatives to this procedure were also discussed.  Bard Power PICC patient education guide, fact sheet on infection prevention and patient information card has been provided to patient /or left at bedside.    PICC/Midline Placement Documentation  PICC Single Lumen 06/08/15 PICC Right Basilic 39 cm 0 cm (Active)  Indication for Insertion or Continuance of Line Home intravenous therapies (PICC only) 07/11/2017  7:20 PM  Exposed Catheter (cm) 0 cm 07/11/2017  7:20 PM  Site Assessment Clean;Dry;Intact 07/11/2017  7:20 PM  Line Status Flushed;Saline locked;Blood return noted 07/11/2017  7:20 PM  Dressing Type Transparent;Securing device 07/11/2017  7:20 PM  Dressing Status Clean;Dry;Intact;Antimicrobial disc in place 07/11/2017  7:20 PM  Dressing Change Due 07/18/17 07/11/2017  7:20 PM       Frances Maywood 07/11/2017, 7:36 PM

## 2017-07-11 NOTE — Progress Notes (Signed)
[facility]  Discussed POC for home IV ABX with pt in preparation for DC to home.  At patient's request, I called Aaron Edelman at the [facility] plan and left message regarding our need for auth for home IV ABX/HHRN.   Pt stated he hoped to leave the hospital tomorrow. Advised pt [facility] would need authorization from his [facility] plan before initiating services. Pt verbalized he understood.  I did tell pt it would likely be Monday before all aspects of his care could be arranged. [facility] team will follow pt and prepare for DC on Monday at this point.    If patient discharges after hours, please call [phone].   Larry Sierras 07/11/2017, 5:35 PM

## 2017-07-11 NOTE — Progress Notes (Signed)
[facility] Coordinator is following pt with ID team and will support IV ABX at home per recommendation by Dr. Baxter Flattery.  [facility] will work with patients Workers Comp plan to obtain authorization for [facility] and Home infusion services.  Orders for IV ABX regimen for home are still pending.  [facility] will not be able to obtain auth until Monday for DC to home.   If patient discharges after hours, please call [phone].   Larry Sierras 07/11/2017, 4:55 PM

## 2017-07-11 NOTE — Consult Note (Signed)
[facility] for Infectious Disease  Total days of antibiotics 2        Day 2 vanco         Reason for Consult: septic arthritis    Referring Physician: collins  Active Problems:   Septic arthritis of knee, right (HCC)   S/P arthroscopy of right knee    HPI: Jason Walsh is a 56 y.o. male with history of  Basal cell ca s/p resection, who injured his right knee at work on 7/13 and had subsequent pain and swelling. He ws referred to dr Theda Sers for evaluation. he had right knee arthroscopy a few weeks ago, but still started to have ongoing swelling and pain of right knee. He had aspiration of joint roughly 1 week ago where his cell count > 60K with left shift. +CPPD, Cultures were NGTD but he had been on cephalexin per report.due to minimal improvement,  he was aspirated in the clinic again yesterday but still having knee pain which showed WBC of 15K with 94% N.  He was admitted for elective washout. OR note reports thick brownish fluid rinsed out. He was  Placed on vancomycin. Cultures are pending. He was started on vancomycin  Past Medical History:  Diagnosis Date  . Cancer ([place])    skin  . Lumbar disc disorder   . Stroke [facility])     Allergies:  Allergies  Allergen Reactions  . Ibuprofen Other (See Comments)    Chest pains    MEDICATIONS: . aspirin EC  325 mg Oral BID  . chlorhexidine  60 mL Topical Once  . docusate sodium  100 mg Oral BID  . polyethylene glycol  17 g Oral BID    Social History  Substance Use Topics  . Smoking status: Current Every Day Smoker    Packs/day: 1.00    Years: 40.00    Types: Cigarettes    Start date: 02/02/1969  . Smokeless tobacco: Never Used  . Alcohol use 0.0 oz/week     Comment: occasional  - smokes a ppd. No drinking.   Family History  Problem Relation Age of Onset  . Cancer Mother   . Parkinson's disease Father   . Alzheimer's disease Father      Review of Systems  Constitutional: Negative for fever, chills, diaphoresis,  activity change, appetite change, fatigue and unexpected weight change.  HENT: Negative for congestion, sore throat, rhinorrhea, sneezing, trouble swallowing and sinus pressure.  Eyes: Negative for photophobia and visual disturbance.  Respiratory: Negative for cough, chest tightness, shortness of breath, wheezing and stridor.  Cardiovascular: Negative for chest pain, palpitations and leg swelling.  Gastrointestinal: Negative for nausea, vomiting, abdominal pain, diarrhea, constipation, blood in stool, abdominal distention and anal bleeding.  Genitourinary: Negative for dysuria, hematuria, flank pain and difficulty urinating.  Musculoskeletal: +right knee pain and swelling.  Negative for myalgias, back pain,  and gait problem.  Skin: Negative for color change, pallor, rash and wound.  Neurological: Negative for dizziness, tremors, weakness and light-headedness.  Hematological: Negative for adenopathy. Does not bruise/bleed easily.  Psychiatric/Behavioral: Negative for behavioral problems, confusion, sleep disturbance, dysphoric mood, decreased concentration and agitation.     OBJECTIVE: Temp:  [97.5 F (36.4 C)-99.2 F (37.3 C)] 97.8 F (36.6 C) (08/03 1309) Pulse Rate:  [59-82] 65 (08/03 1309) Resp:  [12-20] 16 (08/03 1309) BP: (104-160)/(55-89) 132/61 (08/03 1309) SpO2:  [95 %-100 %] 97 % (08/03 1309) Weight:  [184 lb (83.5 kg)] 184 lb (83.5 kg) (08/02 1406)  Physical Exam  Constitutional: He is oriented to person, place, and time. He appears well-developed and well-nourished. No distress.  HENT: edentulous  Mouth/Throat: Oropharynx is clear and moist. No oropharyngeal exudate.  Cardiovascular: Normal rate, regular rhythm and normal heart sounds. Exam reveals no gallop and no friction rub.  No murmur heard.  Pulmonary/Chest: Effort normal and breath sounds normal. No respiratory distress. He has no wheezes.  Abdominal: Soft. Bowel sounds are normal. He exhibits no distension. There is  no tenderness.  Lymphadenopathy:  He has no cervical adenopathy.  Ext: right knee is wrapped, immobilized with accordian drain in place. Neurological: He is alert and oriented to person, place, and time.  Skin: Skin is warm and dry. No rash noted. No erythema.  Psychiatric: He has a normal mood and affect. His behavior is normal.     LABS: Results for orders placed or performed during the hospital encounter of 07/10/17 (from the past 48 hour(s))  Synovial cell count + diff, w/ crystals     Status: Abnormal   Collection Time: 07/10/17  6:17 PM  Result Value Ref Range   Color, Synovial RED (A) YELLOW   Appearance-Synovial TURBID (A) CLEAR   Crystals, Fluid NO CRYSTALS SEEN    WBC, Synovial 15,540 (H) 0 - 200 /cu mm   Neutrophil, Synovial 94 (H) 0 - 25 %   Lymphocytes-Synovial Fld 1 0 - 20 %   Monocyte-Macrophage-Synovial Fluid 5 (L) 50 - 90 %  Aerobic/Anaerobic Culture (surgical/deep wound)     Status: None (Preliminary result)   Collection Time: 07/10/17  6:17 PM  Result Value Ref Range   Specimen Description SYNOVIAL RIGHT KNEE    Special Requests NONE    Gram Stain      ABUNDANT WBC PRESENT, PREDOMINANTLY PMN NO ORGANISMS SEEN Performed at [facility], [address] [place], [place] [address]    Culture PENDING    Report Status PENDING   CBC with Differential/Platelet     Status: Abnormal   Collection Time: 07/11/17  5:27 AM  Result Value Ref Range   WBC 9.5 4.0 - 10.5 K/uL   RBC 3.69 (L) 4.22 - 5.81 MIL/uL   Hemoglobin 12.4 (L) 13.0 - 17.0 g/dL   HCT 35.4 (L) 39.0 - 52.0 %   MCV 95.9 78.0 - 100.0 fL   MCH 33.6 26.0 - 34.0 pg   MCHC 35.0 30.0 - 36.0 g/dL   RDW 12.7 11.5 - 15.5 %   Platelets 292 150 - 400 K/uL   Neutrophils Relative % 89 %   Neutro Abs 8.6 (H) 1.7 - 7.7 K/uL   Lymphocytes Relative 8 %   Lymphs Abs 0.7 0.7 - 4.0 K/uL   Monocytes Relative 3 %   Monocytes Absolute 0.2 0.1 - 1.0 K/uL   Eosinophils Relative 0 %   Eosinophils Absolute 0.0 0.0 -  0.7 K/uL   Basophils Relative 0 %   Basophils Absolute 0.0 0.0 - 0.1 K/uL  Basic metabolic panel     Status: Abnormal   Collection Time: 07/11/17  5:27 AM  Result Value Ref Range   Sodium 139 135 - 145 mmol/L   Potassium 4.4 3.5 - 5.1 mmol/L   Chloride 102 101 - 111 mmol/L   CO2 29 22 - 32 mmol/L   Glucose, Bld 208 (H) 65 - 99 mg/dL   BUN 21 (H) 6 - 20 mg/dL   Creatinine, Ser 0.95 0.61 - 1.24 mg/dL   Calcium 9.0 8.9 - 10.3 mg/dL  GFR calc non Af Amer >60 >60 mL/min   GFR calc Af Amer >60 >60 mL/min    Comment: (NOTE) The eGFR has been calculated using the CKD EPI equation. This calculation has not been validated in all clinical situations. eGFR's persistently <60 mL/min signify possible Chronic Kidney Disease.    Anion gap 8 5 - 15    MICRO: cx 8/2 pending IMAGING: No results found.  Assessment/Plan: 56 yo M with original injury to right knee on 7/13 with persistent swelling and erythema with recent arthrocentesis concerning for septic arthritis of right native knee. Pod #1 from wash out. Currently on vanco  1) await culture results to determine final abtx.  Recommendation. If cultures remain negative, we will recommend daptomycin 12m/kg daily plus levofloxacin daily x 4 wk 2) for now keep on vancomycin.  3) will get baseline sed rate and crp 4) will need picc line for long term antibiotics 5) will coordinate plan with home health and follow up aptt in ID clinic in 3-4 wk 6) health maintenance = will check hep c and hiv

## 2017-07-11 NOTE — Progress Notes (Signed)
[facility]  Call received around 7 PM tonight from [place] at [facility] [phone].  Discussed pt POC regarding need for home IV Daptomycin per Dr. Carlyle Basques.  Kirsten stated she would not be able to process an auth for home care services, [facility]apt[facility], in the next hour before they closed.  She stated she had to have the signed Daptomycin script in order to initiate the auth. Per Elnita Maxwell, she will have everything set on her on to process auth on Monday, once we can fax the script to [facility] [place]. Agreed we would connect on Monday to complete process to support DC of pt home for first home dose on Tuesday, 07-15-17. If patient discharges after hours, please call [phone].   Larry Sierras 07/11/2017, 9:53 PM

## 2017-07-11 NOTE — Progress Notes (Signed)
Subjective: 1 Day Post-Op Procedure(s) (LRB): RIGHT KNEE SCOPE IRRIGATION AND DEBRIDEMENT (Right) Patient reports pain as well controled.  Reports a good night. Tolerating PO's. Denies SOB, CP, or calf pain.   Objective: Vital signs in last 24 hours: Temp:  [97.5 F (36.4 C)-99.2 F (37.3 C)] 97.5 F (36.4 C) (08/03 0600) Pulse Rate:  [60-82] 60 (08/03 0600) Resp:  [12-20] 16 (08/03 0600) BP: (104-160)/(55-89) 104/55 (08/03 0600) SpO2:  [95 %-100 %] 97 % (08/03 0600) Weight:  [83.5 kg (184 lb)] 83.5 kg (184 lb) (08/02 1406)  Intake/Output from previous day: 08/02 0701 - 08/03 0700 In: 2515 [P.O.:960; I.V.:1355; IV Piggyback:200] Out: 1625 [Urine:1600; Blood:25] Intake/Output this shift: No intake/output data recorded.   Recent Labs  07/11/17 0527  HGB 12.4*    Recent Labs  07/11/17 0527  WBC 9.5  RBC 3.69*  HCT 35.4*  PLT 292    Recent Labs  07/11/17 0527  NA 139  K 4.4  CL 102  CO2 29  BUN 21*  CREATININE 0.95  GLUCOSE 208*  CALCIUM 9.0   No results for input(s): LABPT, INR in the last 72 hours.  Well nourished. Alert and oriented x3. RRR, Lungs clear, BS x4. Abdomen soft and non tender. Right Calf soft and non tender. Right knee dressing C/D/I. No DVT signs. Compartment soft. No signs of infection.  Right LE grossly neurovascular intact. Drain in place with minimal drainage.  Assessment/Plan: 1 Day Post-Op Procedure(s) (LRB): RIGHT KNEE SCOPE IRRIGATION AND DEBRIDEMENT (Right) Knee immobilizer ICe to knee TEd stocking ASA SCds Picc line ordered ID consulted for ABX On Vanco D/c when ready after ID seen pt  Jason Walsh L 07/11/2017, 7:55 AM

## 2017-07-11 NOTE — Progress Notes (Signed)
Received a callback from Jason Walsh, she requested information to be faxed to her at [phone]. She states they go through [facility] for services [phone]. Unfortunately the only orders entered at this time were for [facility] services, the antibiotic is still to be determined, awaiting determination from ID. Once home antibiotics are determined, can be faxed to [facility] to arrange.

## 2017-07-11 NOTE — Progress Notes (Signed)
Spoke with patient at bedside regarding d/c plans. Patient is awaiting PICC. Patient has stated that he has had some delays with WC. Informed him that I have contacted his claims adjuster to assist with [facility] arrangements. Was unable to speak directly with her but left my contact information with her. Also left my contact information with the patient in case he gets a call from adjuster. Patient thinks plans are for IV antibiotics, no PT at this time. Patient has crutches, cane, and walker at home. Will continue to follow and await call back from adjuster.   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

## 2017-07-11 NOTE — Progress Notes (Signed)
Received a call from [facility] with [place]. He requested DME order for RW to be arranged. When abx are decided he requested faxing them to him at [phone]. His number is [phone] to confirm orders are approved.

## 2017-07-12 LAB — HIV ANTIBODY (ROUTINE TESTING W REFLEX): HIV Screen 4th Generation wRfx: NONREACTIVE

## 2017-07-12 LAB — BASIC METABOLIC PANEL
Anion gap: 7 (ref 5–15)
BUN: 20 mg/dL (ref 6–20)
CALCIUM: 8.6 mg/dL — AB (ref 8.9–10.3)
CO2: 29 mmol/L (ref 22–32)
CREATININE: 0.85 mg/dL (ref 0.61–1.24)
Chloride: 108 mmol/L (ref 101–111)
GFR calc non Af Amer: >60 mL/min (ref >60)
Glucose, Bld: 111 mg/dL — ABNORMAL HIGH (ref 65–99)
Potassium: 4.3 mmol/L (ref 3.5–5.1)
Sodium: 144 mmol/L (ref 135–145)

## 2017-07-12 LAB — CBC WITH DIFFERENTIAL/PLATELET
Basophils Absolute: 0 10*3/uL (ref 0.0–0.1)
Basophils Relative: 0 %
EOS ABS: 0 10*3/uL (ref 0.0–0.7)
EOS PCT: 0 %
HCT: 34.9 % — ABNORMAL LOW (ref 39.0–52.0)
Hemoglobin: 11.8 g/dL — ABNORMAL LOW (ref 13.0–17.0)
LYMPHS ABS: 2.4 10*3/uL (ref 0.7–4.0)
Lymphocytes Relative: 20 %
MCH: 33.1 pg (ref 26.0–34.0)
MCHC: 33.8 g/dL (ref 30.0–36.0)
MCV: 98 fL (ref 78.0–100.0)
Monocytes Absolute: 0.5 10*3/uL (ref 0.1–1.0)
Monocytes Relative: 4 %
Neutro Abs: 9.3 10*3/uL — ABNORMAL HIGH (ref 1.7–7.7)
Neutrophils Relative %: 76 %
PLATELETS: 277 10*3/uL (ref 150–400)
RBC: 3.56 MIL/uL — AB (ref 4.22–5.81)
RDW: 13.1 % (ref 11.5–15.5)
WBC: 12.2 10*3/uL — ABNORMAL HIGH (ref 4.0–10.5)

## 2017-07-12 LAB — CK: Total CK: 42 U/L — ABNORMAL LOW (ref 49–397)

## 2017-07-12 MED ORDER — LEVOFLOXACIN 750 MG PO TABS
750.0000 mg | ORAL_TABLET | Freq: Every day | ORAL | Status: DC
  Administered 2017-07-12 – 2017-07-14 (×3): 750 mg via ORAL
  Filled 2017-07-12 (×2): qty 2
  Filled 2017-07-12: qty 1

## 2017-07-12 MED ORDER — SODIUM CHLORIDE 0.9 % IV SOLN
500.0000 mg | INTRAVENOUS | Status: DC
  Administered 2017-07-12 – 2017-07-13 (×2): 500 mg via INTRAVENOUS
  Filled 2017-07-12 (×2): qty 10

## 2017-07-12 NOTE — Progress Notes (Signed)
Pharmacy Antibiotic Note  Jason Walsh is a 56 y.o. male with right knee septic arthritris s/p right knee arthroscopy and I&D on 07/10/17.  To start daptomycin for infected knee.   Plan: - daptomycin 500 mg IV q24h (~6mg /kg) - f/u CK - f/u ID recom for LOT and complete OPAT ________________________________  Height: 5\' 9"  (175.3 cm) Weight: 184 lb (83.5 kg) IBW/kg (Calculated) : 70.7  Temp (24hrs), Avg:97.8 F (36.6 C), Min:97.5 F (36.4 C), Max:98 F (36.7 C)   Recent Labs Lab 07/11/17 0527 07/12/17 0459  WBC 9.5 12.2*  CREATININE 0.95 0.85    Estimated Creatinine Clearance: 95.9 mL/min (by C-G formula based on SCr of 0.85 mg/dL).    Allergies  Allergen Reactions  . Ibuprofen Other (See Comments)    Chest pains      Thank you for allowing pharmacy to be a part of this patient's care.  Lynelle Doctor 07/12/2017 8:52 AM

## 2017-07-12 NOTE — Progress Notes (Signed)
     Subjective: 2 Days Post-Op Procedure(s) (LRB): RIGHT KNEE SCOPE IRRIGATION AND DEBRIDEMENT (Right)   Patient reports pain as mild, pain controlled. No events throughout the night. We have discussed his situation with regards to needing to get approval from [facility] for antibiotics at home.  He will need this set up prior to discharge so that he doesn't go home without antibiotics needed to treat his knee.   Objective:   VITALS:   Vitals:   07/11/17 2201 07/12/17 0552  BP: 119/69 134/74  Pulse: 63 (!) 53  Resp: 17 17  Temp: 98 F (36.7 C) (!) 97.5 F (36.4 C)    Dorsiflexion/Plantar flexion intact Incision: dressing C/D/I No cellulitis present Compartment soft  LABS  Recent Labs  07/11/17 0527 07/12/17 0459  HGB 12.4* 11.8*  HCT 35.4* 34.9*  WBC 9.5 12.2*  PLT 292 277     Recent Labs  07/11/17 0527 07/12/17 0459  NA 139 144  K 4.4 4.3  BUN 21* 20  CREATININE 0.95 0.85  GLUCOSE 208* 111*     Assessment/Plan: 2 Days Post-Op Procedure(s) (LRB): RIGHT KNEE SCOPE IRRIGATION AND DEBRIDEMENT (Right) Restart antibiotics in the hospital Up with therapy Discharge home with home health eventually when ready   [place] Pugh. Makailyn Mccormick   PAC  07/12/2017, 8:35 AM

## 2017-07-13 LAB — BASIC METABOLIC PANEL
ANION GAP: 9 (ref 5–15)
BUN: 19 mg/dL (ref 6–20)
CALCIUM: 8.9 mg/dL (ref 8.9–10.3)
CO2: 30 mmol/L (ref 22–32)
Chloride: 105 mmol/L (ref 101–111)
Creatinine, Ser: 0.89 mg/dL (ref 0.61–1.24)
GLUCOSE: 105 mg/dL — AB (ref 65–99)
Potassium: 4 mmol/L (ref 3.5–5.1)
Sodium: 144 mmol/L (ref 135–145)

## 2017-07-13 LAB — CBC WITH DIFFERENTIAL/PLATELET
BASOS ABS: 0 10*3/uL (ref 0.0–0.1)
BASOS PCT: 0 %
Eosinophils Absolute: 0.1 10*3/uL (ref 0.0–0.7)
Eosinophils Relative: 1 %
HEMATOCRIT: 37.4 % — AB (ref 39.0–52.0)
Hemoglobin: 13.1 g/dL (ref 13.0–17.0)
Lymphocytes Relative: 30 %
Lymphs Abs: 2.5 10*3/uL (ref 0.7–4.0)
MCH: 34.2 pg — ABNORMAL HIGH (ref 26.0–34.0)
MCHC: 35 g/dL (ref 30.0–36.0)
MCV: 97.7 fL (ref 78.0–100.0)
MONO ABS: 0.4 10*3/uL (ref 0.1–1.0)
Monocytes Relative: 5 %
NEUTROS ABS: 5.3 10*3/uL (ref 1.7–7.7)
Neutrophils Relative %: 64 %
PLATELETS: 303 10*3/uL (ref 150–400)
RBC: 3.83 MIL/uL — ABNORMAL LOW (ref 4.22–5.81)
RDW: 12.9 % (ref 11.5–15.5)
WBC: 8.3 10*3/uL (ref 4.0–10.5)

## 2017-07-13 MED ORDER — SODIUM CHLORIDE 0.9 % IV SOLN
8.0000 mg/kg | INTRAVENOUS | Status: DC
  Administered 2017-07-14: 668 mg via INTRAVENOUS
  Filled 2017-07-13: qty 13.36

## 2017-07-13 NOTE — Progress Notes (Signed)
   Subjective:  Patient reports pain as moderate.  Denies N/V/CP/SOB.  Objective:   VITALS:   Vitals:   07/12/17 0552 07/12/17 1439 07/12/17 2110 07/13/17 0513  BP: 134/74 136/75 (!) 143/73 127/76  Pulse: (!) 53 (!) 57 (!) 58 63  Resp: 17 15 15 14   Temp: (!) 97.5 F (36.4 C) (!) 97.5 F (36.4 C) 98 F (36.7 C) 97.8 F (36.6 C)  TempSrc: Oral Oral Oral Oral  SpO2: 100% 100% 97% 98%  Weight:      Height:       NAD ABD soft Sensation intact distally Intact pulses distally Dorsiflexion/Plantar flexion intact Incision: dressing C/D/I Compartment soft   Lab Results  Component Value Date   WBC 8.3 07/13/2017   HGB 13.1 07/13/2017   HCT 37.4 (L) 07/13/2017   MCV 97.7 07/13/2017   PLT 303 07/13/2017   BMET    Component Value Date/Time   NA 144 07/13/2017 0432   K 4.0 07/13/2017 0432   CL 105 07/13/2017 0432   CO2 30 07/13/2017 0432   GLUCOSE 105 (H) 07/13/2017 0432   BUN 19 07/13/2017 0432   CREATININE 0.89 07/13/2017 0432   CALCIUM 8.9 07/13/2017 0432   GFRNONAA >60 07/13/2017 0432   GFRAA >60 07/13/2017 0432    Recent Results (from the past 240 hour(s))  Aerobic/Anaerobic Culture (surgical/deep wound)     Status: None (Preliminary result)   Collection Time: 07/10/17  6:17 PM  Result Value Ref Range Status   Specimen Description SYNOVIAL RIGHT KNEE  Final   Special Requests NONE  Final   Gram Stain   Final    ABUNDANT WBC PRESENT, PREDOMINANTLY PMN NO ORGANISMS SEEN    Culture   Final    NO GROWTH 2 DAYS NO ANAEROBES ISOLATED; CULTURE IN PROGRESS FOR 5 DAYS Performed at [facility], [address] [place], [place] [address]    Report Status PENDING  Incomplete     Assessment/Plan: 3 Days Post-Op   Active Problems:   Septic arthritis of knee, right (HCC)   S/P arthroscopy of right knee   WBAT DVT ppx: ASA, SCDs, TEDs ID following for abx selection and duration PT/OT Retain drain Dispo: d/c home after home IV abx  arranged   Jason Walsh, Jason Walsh 07/13/2017, 8:34 AM   Jason Can, MD Cell [phone]

## 2017-07-13 NOTE — Progress Notes (Signed)
[facility] for Infectious Disease    Date of Admission:  07/10/2017   Total days of antibiotics 4        Day 2 dapto        Day 2 cipro           ID: Jason Walsh is a 56 y.o. 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 He had picc line placed without difficulty  Medications:  . aspirin EC  325 mg Oral BID  . chlorhexidine  60 mL Topical Once  . docusate sodium  100 mg Oral BID  . levofloxacin  750 mg Oral Daily  . polyethylene glycol  17 g Oral BID    Objective: Vital signs in last 24 hours: Temp:  [97.5 F (36.4 C)-98 F (36.7 C)] 97.8 F (36.6 C) (08/05 0513) Pulse Rate:  [57-63] 63 (08/05 0513) Resp:  [14-15] 14 (08/05 0513) BP: (127-143)/(73-76) 127/76 (08/05 0513) SpO2:  [97 %-100 %] 98 % (08/05 0513) Physical Exam  Constitutional: He is oriented to person, place, and time. He appears well-developed and well-nourished. No distress.  HENT:  Mouth/Throat: Oropharynx is clear and moist. No oropharyngeal exudate.  Cardiovascular: Normal rate, regular rhythm and normal heart sounds. Exam reveals no gallop and no friction rub.  No murmur heard.  Pulmonary/Chest: Effort normal and breath sounds normal. No respiratory distress. He has no wheezes.  Ext: right knee is wrapped, still has drain in place Skin: Skin is warm and dry. No rash noted. No erythema.  Psychiatric: He has a normal mood and affect. His behavior is normal.     Lab Results  Recent Labs  07/12/17 0459 07/13/17 0432  WBC 12.2* 8.3  HGB 11.8* 13.1  HCT 34.9* 37.4*  NA 144 144  K 4.3 4.0  CL 108 105  CO2 29 30  BUN 20 19  CREATININE 0.85 0.89   Sedimentation Rate  Recent Labs  07/11/17 1440  ESRSEDRATE 78*   C-Reactive Protein  Recent Labs  07/11/17 1440  CRP 15.8*    Microbiology: 8/2 synovial cx - NGTD  (previously on abtx) Studies/Results: No results found.   Assessment/Plan: Culture negative septic arthritis =  Plan for 4 weeks of daptomycin dosed at '8mg'$ /kg IV daily and levofloxacin '750mg'$  po daily.  - his dapto is at '6mg'$ /kg, we will increase dose to start tomorrow   Will arrange for follow up in the id clinic. OPAT orders listed below ------------------------------------------------------------------ Diagnosis: Septic arthritis of knee  Culture Result: negative  Allergies  Allergen Reactions  . Ibuprofen Other (See Comments)    Chest pains    OPAT Orders Discharge antibiotics: Per pharmacy protocol  daptomycin at '8mg'$ /kg per day  Duration: 4 weeks  End Date: August 31st  [place] Per Protocol:  Labs weekly while on IV antibiotics: _x_ CBC with differential __x BMP _x_ CK _x_ CRP _x_ ESR  _x_ Please pull PIC at completion of IV antibiotics   Fax weekly labs to [phone]  Clinic Follow Up Appt: 3-4 wk  @ [facility], [facility] for Infectious Diseases Cell: [phone] Pager: [phone]  07/13/2017, 9:32 AM

## 2017-07-13 NOTE — Progress Notes (Signed)
PHARMACY CONSULT NOTE FOR:  OUTPATIENT  PARENTERAL ANTIBIOTIC THERAPY (OPAT)  Indication: culture negative PJI Regimen: daptomycin 675mg  IV q24h End date:  08/08/2017  IV antibiotic discharge orders are pended. To discharging provider:  please sign these orders via discharge navigator,  Select New Orders & click on the button choice - Manage This Unsigned Work.     Thank you for allowing pharmacy to be a part of this patient's care.  Clovis Riley 07/13/2017, 11:16 AM

## 2017-07-14 LAB — HEPATITIS C ANTIBODY

## 2017-07-14 LAB — CBC WITH DIFFERENTIAL/PLATELET
Basophils Absolute: 0 10*3/uL (ref 0.0–0.1)
Basophils Relative: 0 %
EOS ABS: 0.1 10*3/uL (ref 0.0–0.7)
EOS PCT: 1 %
HCT: 38.3 % — ABNORMAL LOW (ref 39.0–52.0)
Hemoglobin: 13.4 g/dL (ref 13.0–17.0)
LYMPHS ABS: 2.5 10*3/uL (ref 0.7–4.0)
Lymphocytes Relative: 30 %
MCH: 33.7 pg (ref 26.0–34.0)
MCHC: 35 g/dL (ref 30.0–36.0)
MCV: 96.2 fL (ref 78.0–100.0)
MONOS PCT: 5 %
Monocytes Absolute: 0.4 10*3/uL (ref 0.1–1.0)
Neutro Abs: 5.4 10*3/uL (ref 1.7–7.7)
Neutrophils Relative %: 64 %
PLATELETS: 317 10*3/uL (ref 150–400)
RBC: 3.98 MIL/uL — ABNORMAL LOW (ref 4.22–5.81)
RDW: 12.5 % (ref 11.5–15.5)
WBC: 8.5 10*3/uL (ref 4.0–10.5)

## 2017-07-14 MED ORDER — SODIUM CHLORIDE 0.9 % IV SOLN: 8.0000 mg/kg | INTRAVENOUS | 80 refills | Status: DC

## 2017-07-14 MED ORDER — DAPTOMYCIN IV (FOR PTA / DISCHARGE USE ONLY): 675.0000 mg | INTRAVENOUS | 0 refills | Status: AC

## 2017-07-14 MED ORDER — LEVOFLOXACIN 750 MG PO TABS: 750.0000 mg | ORAL_TABLET | Freq: Every day | ORAL | 0 refills | Status: DC

## 2017-07-14 NOTE — Discharge Summary (Signed)
Physician Discharge Summary  Patient ID: Jason Walsh MRN: [id number] DOB/AGE: 13-Nov-1961 56 y.o.  Admit date: 07/10/2017 Discharge date: 07/14/2017  Admission Diagnoses:  Discharge Diagnoses:  Active Problems:   Septic arthritis of knee, right (HCC)   S/P arthroscopy of right knee   Discharged Condition: good  Hospital Course:  Jason Walsh is a 56 y.o. who was admitted to [facility]. They were brought to the operating room on 07/10/2017 and underwent Procedure(s): RIGHT KNEE [place].  Patient tolerated the procedure well and was later transferred to the recovery room and then to the orthopaedic floor for postoperative care.  They were given PO and IV analgesics for pain control following their surgery.  They were given 24 hours of postoperative antibiotics of  Anti-infectives    Start     Dose/Rate Route Frequency Ordered Stop   07/14/17 1000  DAPTOmycin (CUBICIN) 668 mg in sodium chloride 0.9 % IVPB     8 mg/kg  83.5 kg 226.7 mL/hr over 30 Minutes Intravenous Every 24 hours 07/13/17 0937     07/14/17 0000  daptomycin (CUBICIN) IVPB     675 mg Intravenous Every 24 hours 07/14/17 0916 08/09/17 2359   07/14/17 0000  DAPTOmycin 668 mg in sodium chloride 0.9 % 100 mL     8 mg/kg  83.5 kg 226.7 mL/hr over 30 Minutes Intravenous Every 24 hours 07/14/17 0834     07/14/17 0000  levofloxacin (LEVAQUIN) 750 MG tablet     750 mg Oral Daily 07/14/17 0834     07/12/17 1200  levofloxacin (LEVAQUIN) tablet 750 mg     750 mg Oral Daily 07/12/17 1131     07/12/17 1000  DAPTOmycin (CUBICIN) 500 mg in sodium chloride 0.9 % IVPB  Status:  Discontinued     500 mg 220 mL/hr over 30 Minutes Intravenous Every 24 hours 07/12/17 0903 07/13/17 0937   07/11/17 0500  vancomycin (VANCOCIN) IVPB 1000 mg/200 mL premix     1,000 mg 200 mL/hr over 60 Minutes Intravenous Every 12 hours 07/10/17 2006 07/11/17 2102   07/10/17 1415  vancomycin (VANCOCIN) IVPB 1000 mg/200 mL  premix     1,000 mg 200 mL/hr over 60 Minutes Intravenous On call to O.R. 07/10/17 1401 07/10/17 1815     and started on DVT prophylaxis in the form of ASA.   Discharge planning consulted to help with postop disposition and equipment needs.  Patient had a good night on the evening of surgery and started to get up OOB with therapy on day one.  H Dressing was with normal limits.  Patient was seen in rounds and was ready to go home.  Consults: Dr.Snider   Significant Diagnostic Studies:Labs  Treatments: PICC placement  Discharge Exam: Blood pressure 132/88, pulse (!) 58, temperature 98 F (36.7 C), temperature source Oral, resp. rate 17, height '5\' 9"'  (1.753 m), weight 83.5 kg (184 lb), SpO2 99 %. Well nourished. Alert and oriented x3. RRR, Lungs clear, BS x4. Abdomen soft and non tender. Right Calf soft and non tender. Right knee dressing C/D/I. No DVT signs. Compartment soft. No signs of infection.  Right LE grossly neurovascular intact.  Disposition: 01-Home or Self Care  Discharge Instructions    Call MD / Call 911    Complete by:  As directed    If you experience chest pain or shortness of breath, CALL 911 and be transported to the hospital emergency room.  If you develope a fever above 101  F, pus (white drainage) or increased drainage or redness at the wound, or calf pain, call your surgeon's office.   Constipation Prevention    Complete by:  As directed    Drink plenty of fluids.  Prune juice may be helpful.  You may use a stool softener, such as Colace (over the counter) 100 mg twice a day.  Use MiraLax (over the counter) for constipation as needed.   Diet - low sodium heart healthy    Complete by:  As directed    Discharge instructions    Complete by:  As directed    DISCHARGE INSTRUCTIONS ________________________________________________________________________________   ARTHROSCOPIC [place], MD [facility] [address], [address] [place], [place]  [address] [phone]   PAIN You will be expected to have a moderate amount of pain in the affected knee for approximately two weeks.  However, the first 2-4 days will be the most severe pain.  Prescriptions have been provided to take as needed for the pain.  The pain can be markedly reduced by using ice/compressive bandage for the first 48-72 hours.  Exchange the icepacks whenever they thaw.  During the night, keep the bandage on because it will still provide some compression for the swelling.  Also, keep the leg elevated on pillows above your heart and this will help alleviate the pain and swelling.  To prevent constipation while on pain medication, take Colace 121m twice a day.  If you become constipated, take Miralax 17gm once a day with plenty of fluids.  ACTIVITY It is preferred that you stay at bed rest for approximately 24 hours.  However, you may go to the bathroom with help.  After this, you can start to be up and about progressively more.  Remember that the swelling may still increase after three to four days if you are up and doing too much.  You may put as much weight on the affected leg as pain will allow.  Use your crutches for comfort and safety.  However, as soon as you are able, you may discard the crutches and go without them.   DRESSING Keep the current dressing as dry as possible.  3 days after your surgery, you may remove the ice/compressive wrap, TED stocking and surgical dressing.  You may now take a shower, but do not scrub the wounds directly with soap.  Let water rinse over these and gently wipe with your hand.  Reapply band-aids over the puncture wounds and more gauze if needed.  A slight amount of thin drainage can be normal at this time, do not let it frighten you.  Reapply the TED hose and the ice/compressive wrap.  You may now repeat this every day for another shower.    SYMPTOMS TO REPORT TO YOUR  DOCTOR Extreme pain   Extreme swelling        Temperature above 101 that does not come down with Tylenol   Any changes in the feeling, color or movement in your toes   Redness, heat or swelling at your incision  NOTE:  The puncture wounds may be slightly red as they heal.  This is normal.  EXERCISE It is preferred that you begin to exercise on the day of your surgery.  Straight leg raises and short arc quads should be started the afternoon or evening of surgery and continued until you come back for your follow-up appointment.  Attached is an instruction sheet on  how to perform these two simple exercises.  Do these at least three times per day if not more.  You may bend your knee as much as is comfortable.  The puncture wounds may occasionally be slightly uncomfortable with bending of the knee, and do not let this frighten you.  It is important to keep your knee motion, but do not overdo it.  If you have significant pain, simply do not bend the knee as far.  You will be given more exercises to perform at your first return visit.  RETURN APPOINTMENT Please make an appointment to be seen in the office 7-10 days from your surgery.         ARTHROSCOPIC KNEE SURGERY POST OPERATIVE EXERCISES  SHORT ARC QUADS  Lie on back with legs straight Place towel roll under thigh, just above knee Tighten thigh muscles to straighten knee and lift heel off bed Hold for slow count of five, then lower Do three sets of ten         STRAIGHT LEG RAISES  Lie on back with operative leg straight and other leg bent Keeping operative leg completely straight, slowly lift the leg so foot is 5" off bed Hold for slow count of five, then lower Do three sets of ten         DO BOTH EXERCISES 3-4 TIMES A DAY.  ANKLE PUMPS  Work/move both ankle and foot up and down 10 times every hour while awake.   Home infusion instructions [facility] May follow [place] Dosing Protocol; May administer  Cathflo as needed to maintain patency of vascular access device.; Flushing of vascular access device: per [facility] Protocol: 0.9% NaCl pre/post medica...    Complete by:  As directed    Instructions:  May follow [place] Dosing Protocol   Instructions:  May administer Cathflo as needed to maintain patency of vascular access device.   Instructions:  Flushing of vascular access device: per [facility] Protocol: 0.9% NaCl pre/post medication administration and prn patency; Heparin 100 u/ml, 2m for implanted ports and Heparin 10u/ml, 552mfor all other central venous catheters.   Instructions:  May follow [facility] Anaphylaxis Protocol for First Dose Administration in the home: 0.9% NaCl at 25-50 ml/hr to maintain IV access for protocol meds. Epinephrine 0.3 ml IV/IM PRN and Benadryl 25-50 IV/IM PRN s/s of anaphylaxis.   Instructions:  [facility] (RN) to assist per patient IV care needs in the home PRN.   Increase activity slowly as tolerated    Complete by:  As directed      Allergies as of 07/14/2017      Reactions   Ibuprofen Other (See Comments)   Chest pains      Medication List    STOP taking these medications   doxycycline 100 MG tablet Commonly known as:  VIBRA-TABS   HYDROcodone-acetaminophen 5-325 MG tablet Commonly known as:  NORCO/VICODIN Replaced by:  HYDROcodone-acetaminophen 7.5-325 MG tablet     TAKE these medications   aspirin 325 MG EC tablet Take 1 tablet (325 mg total) by mouth 2 (two) times daily.   DAPTOmycin 668 mg in sodium chloride 0.9 % 100 mL Inject 668 mg into the vein daily.   daptomycin IVPB Commonly known as:  CUBICIN Inject 675 mg into the vein daily. 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 Replaces:  HYDROcodone-acetaminophen 5-325 MG tablet   levofloxacin 750 MG tablet Commonly known as:  LEVAQUIN Take 1 tablet (750 mg total) by mouth daily.            Home Infusion Instuctions        Start     Ordered   07/14/17 0000  Home infusion instructions [facility] May follow [place] Dosing Protocol; May administer Cathflo as needed to maintain patency of vascular access device.; Flushing of vascular access device: per [facility] Protocol: 0.9% NaCl pre/post medica...    Question Answer Comment  Instructions May follow [place] Dosing Protocol   Instructions May administer Cathflo as needed to maintain patency of vascular access device.   Instructions Flushing of vascular access device: per [facility] Protocol: 0.9% NaCl pre/post medication administration and prn patency; Heparin 100 u/ml, 14m for implanted ports and Heparin 10u/ml, 521mfor all other central venous catheters.   Instructions May follow [facility] Anaphylaxis Protocol for First Dose Administration in the home: 0.9% NaCl at 25-50 ml/hr to maintain IV access for protocol meds. Epinephrine 0.3 ml IV/IM PRN and Benadryl 25-50 IV/IM PRN s/s of anaphylaxis.   Instructions [facility] Infusion Coordinator (RN) to assist per patient IV care needs in the home PRN.      07/14/17 [id number]     Durable Medical Equipment        Start     Ordered   07/11/17 1634  For home use only DME Walker rolling  Once    Question:  Patient needs a walker to treat with the following condition  Answer:  S/P hip replacement   07/11/17 1634       Signed: STLajean Manes/05/2017, 9:25 AM

## 2017-07-14 NOTE — Progress Notes (Signed)
Received a call that Jason Walsh is complete, [place] notified. Patient has DME, ready for d/c. Notified nurse ok to d/c.

## 2017-07-14 NOTE — Progress Notes (Signed)
Subjective: 4 Days Post-Op Procedure(s) (LRB): RIGHT KNEE SCOPE IRRIGATION AND DEBRIDEMENT (Right) Patient reports pain as mild.  Reports a good night. Denies CP,SOB, or calf pain.  Objective: Vital signs in last 24 hours: Temp:  [97.8 F (36.6 C)-98 F (36.7 C)] 98 F (36.7 C) (08/06 0558) Pulse Rate:  [56-58] 58 (08/06 0558) Resp:  [16-17] 17 (08/06 0558) BP: (115-132)/(73-88) 132/88 (08/06 0558) SpO2:  [96 %-99 %] 99 % (08/06 0558)  Intake/Output from previous day: 08/05 0701 - 08/06 0700 In: 2856.3 [P.O.:1030; I.V.:1826.3] Out: 3075 [Urine:3075] Intake/Output this shift: No intake/output data recorded.   Recent Labs  07/12/17 0459 07/13/17 0432 07/14/17 0315  HGB 11.8* 13.1 13.4    Recent Labs  07/13/17 0432 07/14/17 0315  WBC 8.3 8.5  RBC 3.83* 3.98*  HCT 37.4* 38.3*  PLT 303 317    Recent Labs  07/12/17 0459 07/13/17 0432  NA 144 144  K 4.3 4.0  CL 108 105  CO2 29 30  BUN 20 19  CREATININE 0.85 0.89  GLUCOSE 111* 105*  CALCIUM 8.6* 8.9   No results for input(s): LABPT, INR in the last 72 hours.  Well nourished. Alert and oriented x3. RRR, Lungs clear, BS x4. Abdomen soft and non tender. Right Calf soft and non tender. Right knee dressing C/D/I. No DVT signs. Compartment soft. No signs of infection.  Right LE grossly neurovascular intact.  Assessment/Plan: 4 Days Post-Op Procedure(s) (LRB): RIGHT KNEE SCOPE IRRIGATION AND DEBRIDEMENT (Right) Drain d/c'ed Knee immbolizer D/c home with HH IV ABX per ID Home nursing care for Iv ABX and PICC F/u in office  Jason Walsh 07/14/2017, 8:30 AM

## 2017-07-14 NOTE — Progress Notes (Signed)
Patient discharged to home via family car, with [facility] to follow. Patient states understands discharge instructions. PICC intact flushed with good blood return, dressing c/d/i.

## 2017-07-15 LAB — AEROBIC/ANAEROBIC CULTURE (SURGICAL/DEEP WOUND): CULTURE: NO GROWTH

## 2017-07-24 ENCOUNTER — Other Ambulatory Visit: Payer: Self-pay | Admitting: Pharmacist

## 2017-07-28 ENCOUNTER — Other Ambulatory Visit: Payer: Self-pay | Admitting: Internal Medicine

## 2017-07-28 MED ORDER — ONDANSETRON 8 MG PO TBDP: 8.0000 mg | ORAL_TABLET | Freq: Three times a day (TID) | ORAL | 2 refills | Status: DC | PRN

## 2017-07-31 ENCOUNTER — Other Ambulatory Visit: Payer: Self-pay | Admitting: Pharmacist

## 2017-08-04 ENCOUNTER — Encounter: Payer: Self-pay | Admitting: Infectious Disease

## 2017-08-05 ENCOUNTER — Encounter: Payer: Self-pay | Admitting: Internal Medicine

## 2017-08-05 ENCOUNTER — Ambulatory Visit (INDEPENDENT_AMBULATORY_CARE_PROVIDER_SITE_OTHER): Payer: Worker's Compensation | Admitting: Internal Medicine

## 2017-08-05 VITALS — BP 152/91 | HR 73 | Temp 98.0°F | Wt 187.0 lb

## 2017-08-05 DIAGNOSIS — M00269 Other streptococcal arthritis, unspecified knee: Secondary | ICD-10-CM

## 2017-08-05 NOTE — Patient Instructions (Signed)
Thank you for allowing [name] to be a part of your care. Your knee appears better and your labwork is good. Hence, antibiotics will be stopped on 08/08/17. Please return to clinic as needed.

## 2017-08-05 NOTE — Progress Notes (Signed)
Patient Name: Jason Walsh  MRN: [id number]  DOB: Nov 19, 1961    Reason for Visit: Hospital follow-up for septic arthritis  Referring Provider:   INFECTIOUS DISEASE OFFICE CONSULT SUBJECTIVE:  HPI: KANYON SEIBOLD is a 56 y.o. male with history of basal cell ca s/p resection who presents for hospital follow up regarding right septic arthritis. The patient had injured his right knee at work on 7/13 when he pressed his knee against a pipe to align two pieces of pipe. Knee arthroscopy with aspiration was done without relief. Cultures of aspirate showed no growth, but the patient this is likely due to patient being treated with cephalexin prior. The patient had elective washout which produced thick brown fluid. He was placed on vancomycin initially and then subsequently changed to daptomycin and levofloxacin as outpatient regimen.  The patient states that he has 2/10 pain in his right knee now, feels that the swelling has reduced, but states that the knee is warm to touch. The patient has been adherent to his antibiotic regimen.   Patient Active Problem List   Diagnosis Date Noted  . Septic arthritis of knee, right ([place]) 07/10/2017  . S/P arthroscopy of right knee 07/10/2017  . Vision changes 01/10/2017  . Hyperlipidemia 01/04/2015  . Cocaine abuse 01/04/2015  . Unsteady gait 01/04/2015  . Cerebellar infarct ([place]) 01/03/2015  . Tobacco abuse 01/03/2015    Patient's Medications  New Prescriptions   No medications on file  Previous Medications   ASPIRIN EC 325 MG EC TABLET    Take 1 tablet (325 mg total) by mouth 2 (two) times daily.   DAPTOMYCIN (CUBICIN) IVPB    Inject 675 mg into the vein daily. 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   HYDROCODONE-ACETAMINOPHEN  (NORCO) 7.5-325 MG TABLET    Take 1 tablet by mouth every 4 (four) hours as needed (breakthrough pain).   LEVOFLOXACIN (LEVAQUIN) 750 MG TABLET    Take 1 tablet (750 mg total) by mouth daily.   ONDANSETRON (ZOFRAN ODT) 8 MG DISINTEGRATING TABLET    Take 1 tablet (8 mg total) by mouth every 8 (eight) hours as needed for nausea or vomiting.  Modified Medications   No medications on file  Discontinued Medications   No medications on file    Review of Systems  Constitutional: Negative for chills and fever.  Gastrointestinal: Negative for nausea and vomiting.  Musculoskeletal: Positive for joint pain (right knee).  Skin: Negative for rash.      Past Medical History:  Diagnosis Date  . Cancer ([place])    skin  . Lumbar disc disorder   . Stroke [facility])     Social History  Substance Use Topics  . Smoking status: Current Every Day Smoker    Packs/day: 1.00    Years: 40.00    Types: Cigarettes    Start date: 02/02/1969  . Smokeless tobacco: Never Used  . Alcohol use 0.0 oz/week     Comment: occasional    Family History  Problem Relation Age of Onset  . Cancer Mother   . Parkinson's disease Father   . Alzheimer's disease Father      Allergies  Allergen Reactions  . Ibuprofen Other (See Comments)  Chest pains     OBJECTIVE: There were no vitals filed for this visit. There is no height or weight on file to calculate BMI.  Physical Exam  Constitutional: He is well-developed, well-nourished, and in no distress.  HENT:  Head: Normocephalic and atraumatic.  Cardiovascular: Normal rate, regular rhythm and normal heart sounds.   Pulmonary/Chest: Effort normal and breath sounds normal. No respiratory distress. He has no wheezes.  Abdominal: Soft. Bowel sounds are normal. He exhibits distension. There is no tenderness.  Musculoskeletal:       Right knee: He exhibits swelling. He exhibits normal range of motion and no erythema. Tenderness found.  Right knee warm to touch    Psychiatric: Mood, memory, affect and judgment normal.      ASSESSMENT & PLAN:  Problem List Items Addressed This Visit    None     Septic Arthritis The patient appears to have recovered well from his knee injury. The swelling has decreased in size and he has good rom in his right knee. His baseline ESR= 78, CRP=15.8 on 07/11/17. The ESR=5 on 08/04/17.  Synovial fluid wbc=15540, neutrophils=94, red color and turbid appearing. Culture 07/10/17 shows no growth for 5 days. The patient's CK=58 (07/21/17) Patient does not have any sob or any abnormal breath sounds on auscultation and therefore unlikely to have any complication of eosinophilic pneumonitis.  Since the patient has recovered well he finish his course of daptomycin and levaquin and stop on 08/07/14.   Lars Mage, MD Internal Medicine PGY1 Pager:[phone] 08/05/2017, 8:52 AM ------------------------------------ I have seen and examined patient with Dr Maricela Bo. Agree with the plan as outlined above

## 2017-08-08 ENCOUNTER — Other Ambulatory Visit: Payer: Self-pay | Admitting: Pharmacist

## 2017-09-09 HISTORY — PX: MENISCUS REPAIR: SHX5179

## 2019-01-14 ENCOUNTER — Emergency Department (HOSPITAL_COMMUNITY): Payer: Self-pay

## 2019-01-14 ENCOUNTER — Other Ambulatory Visit: Payer: Self-pay

## 2019-01-14 ENCOUNTER — Observation Stay (HOSPITAL_COMMUNITY)
Admission: EM | Admit: 2019-01-14 | Discharge: 2019-01-15 | Disposition: A | Discharge status: Home / Self Care | Payer: Self-pay | Attending: Emergency Medicine | Admitting: Emergency Medicine

## 2019-01-14 ENCOUNTER — Encounter (HOSPITAL_COMMUNITY): Payer: Self-pay

## 2019-01-14 DIAGNOSIS — R29898 Other symptoms and signs involving the musculoskeletal system: Secondary | ICD-10-CM

## 2019-01-14 DIAGNOSIS — Z79899 Other long term (current) drug therapy: Secondary | ICD-10-CM | POA: Insufficient documentation

## 2019-01-14 DIAGNOSIS — Z720 Tobacco use: Secondary | ICD-10-CM | POA: Diagnosis present

## 2019-01-14 DIAGNOSIS — G459 Transient cerebral ischemic attack, unspecified: Secondary | ICD-10-CM

## 2019-01-14 DIAGNOSIS — E876 Hypokalemia: Secondary | ICD-10-CM | POA: Diagnosis present

## 2019-01-14 DIAGNOSIS — Z8673 Personal history of transient ischemic attack (TIA), and cerebral infarction without residual deficits: Secondary | ICD-10-CM | POA: Insufficient documentation

## 2019-01-14 DIAGNOSIS — G5601 Carpal tunnel syndrome, right upper limb: Secondary | ICD-10-CM | POA: Diagnosis present

## 2019-01-14 DIAGNOSIS — R200 Anesthesia of skin: Principal | ICD-10-CM | POA: Diagnosis present

## 2019-01-14 DIAGNOSIS — F17210 Nicotine dependence, cigarettes, uncomplicated: Secondary | ICD-10-CM | POA: Insufficient documentation

## 2019-01-14 DIAGNOSIS — R202 Paresthesia of skin: Secondary | ICD-10-CM | POA: Diagnosis present

## 2019-01-14 DIAGNOSIS — Z7982 Long term (current) use of aspirin: Secondary | ICD-10-CM | POA: Insufficient documentation

## 2019-01-14 DIAGNOSIS — Z85828 Personal history of other malignant neoplasm of skin: Secondary | ICD-10-CM | POA: Insufficient documentation

## 2019-01-14 LAB — COMPREHENSIVE METABOLIC PANEL
ALT: 26 U/L (ref 0–44)
AST: 22 U/L (ref 15–41)
Albumin: 4.1 g/dL (ref 3.5–5.0)
Alkaline Phosphatase: 67 U/L (ref 38–126)
Anion gap: 7 (ref 5–15)
BUN: 16 mg/dL (ref 6–20)
CO2: 24 mmol/L (ref 22–32)
Calcium: 8.5 mg/dL — ABNORMAL LOW (ref 8.9–10.3)
Chloride: 107 mmol/L (ref 98–111)
Creatinine, Ser: 1.03 mg/dL (ref 0.61–1.24)
GFR calc Af Amer: >60 mL/min (ref >60)
GFR calc non Af Amer: >60 mL/min (ref >60)
Glucose, Bld: 120 mg/dL — ABNORMAL HIGH (ref 70–99)
Potassium: 3.4 mmol/L — ABNORMAL LOW (ref 3.5–5.1)
Sodium: 138 mmol/L (ref 135–145)
Total Bilirubin: 0.6 mg/dL (ref 0.3–1.2)
Total Protein: 6.5 g/dL (ref 6.5–8.1)

## 2019-01-14 LAB — CBC
HEMATOCRIT: 43.7 % (ref 39.0–52.0)
Hemoglobin: 15.1 g/dL (ref 13.0–17.0)
MCH: 33 pg (ref 26.0–34.0)
MCHC: 34.6 g/dL (ref 30.0–36.0)
MCV: 95.4 fL (ref 80.0–100.0)
Platelets: 288 10*3/uL (ref 150–400)
RBC: 4.58 MIL/uL (ref 4.22–5.81)
RDW: 12.1 % (ref 11.5–15.5)
WBC: 10.4 10*3/uL (ref 4.0–10.5)
nRBC: 0 % (ref 0.0–0.2)

## 2019-01-14 LAB — DIFFERENTIAL
Abs Immature Granulocytes: 0.02 10*3/uL (ref 0.00–0.07)
Basophils Absolute: 0 10*3/uL (ref 0.0–0.1)
Basophils Relative: 0 %
Eosinophils Absolute: 0.1 10*3/uL (ref 0.0–0.5)
Eosinophils Relative: 1 %
Immature Granulocytes: 0 %
LYMPHS PCT: 30 %
Lymphs Abs: 3.1 10*3/uL (ref 0.7–4.0)
Monocytes Absolute: 0.6 10*3/uL (ref 0.1–1.0)
Monocytes Relative: 6 %
Neutro Abs: 6.5 10*3/uL (ref 1.7–7.7)
Neutrophils Relative %: 63 %

## 2019-01-14 LAB — PROTIME-INR
INR: 0.92
Prothrombin Time: 12.3 seconds (ref 11.4–15.2)

## 2019-01-14 LAB — APTT: aPTT: 29 seconds (ref 24–36)

## 2019-01-14 LAB — ETHANOL: Alcohol, Ethyl (B): <10 mg/dL (ref <10)

## 2019-01-14 NOTE — ED Provider Notes (Signed)
MSE was initiated and I personally evaluated the patient and placed orders (if any) at  10:44 PM on January 14, 2019.  The patient appears stable so that the remainder of the MSE may be completed by another provider.  He is a prior history of a stroke about 4 years ago he said his right hand has been tingling and painful for a couple of weeks.  There is been intermittent blurry vision.  Sometime today maybe 3 or 4 hours ago he noticed that the tingling in the right hand it moved up into his forearm into his axilla.  Not involving the right leg or face.  The nurses are getting the basic lab work EKG started.   Hayden Rasmussen, MD 01/14/19 2245

## 2019-01-14 NOTE — ED Notes (Signed)
Dr Melina Copa at bedside to evaluate need to call code stroke- no code stroke called at this time.

## 2019-01-14 NOTE — ED Provider Notes (Signed)
[facility] EMERGENCY DEPARTMENT Provider Note   CSN: [id number] Arrival date & time: 01/14/19  2230     History   Chief Complaint Chief Complaint  Patient presents with  . Numbness    HPI DANH BAYUS is a 58 y.o. male.  Patient with history of previous stroke and previous substance abuse presenting with tingling, numbness, pain and weakness in his right arm.  States he is been having pain with intermittent tingling in his right hand and wrist over the past several weeks.  This is involve mostly his third fourth and fifth digits.  This was intermittent on and off.  He became concerned today when he noticed tingling and numbness progressing up his right arm to his axilla with increased weakness and pain in his arm.  He denies any neck or back pain.  He denies any headache.  He states he is been having issues with blurry vision over the several weeks as well as intermittent weakness in his right leg but none at this time.  He denies any chest pain, cough, fever, nausea, vomiting.  There has been no difficulty speaking or difficulty swallowing.  The history is provided by the patient.    Past Medical History:  Diagnosis Date  . Cancer ([place])    skin  . Lumbar disc disorder   . Stroke [facility])     Patient Active Problem List   Diagnosis Date Noted  . Septic arthritis of knee, right ([place]) 07/10/2017  . S/P arthroscopy of right knee 07/10/2017  . Vision changes 01/10/2017  . Hyperlipidemia 01/04/2015  . Cocaine abuse ([place]) 01/04/2015  . Unsteady gait 01/04/2015  . Cerebellar infarct ([place]) 01/03/2015  . Tobacco abuse 01/03/2015    Past Surgical History:  Procedure Laterality Date  . IRRIGATION AND DEBRIDEMENT KNEE Right 07/10/2017   Procedure: RIGHT KNEE SCOPE IRRIGATION AND DEBRIDEMENT;  Surgeon: Sydnee Cabal, MD;  Location: [facility] ORS;  Service: Orthopedics;  Laterality: Right;  . NOSE SURGERY          Home Medications    Prior to Admission medications   Medication Sig  Start Date End Date Taking? Authorizing Provider  aspirin EC 325 MG EC tablet Take 1 tablet (325 mg total) by mouth 2 (two) times daily. 07/11/17   Stilwell, Bryson L, PA-C  DAPTOmycin 668 mg in sodium chloride 0.9 % 100 mL Inject 668 mg into the vein daily. 07/14/17   Stilwell, Sueanne Margarita, PA-C  HYDROcodone-acetaminophen (NORCO) 7.5-325 MG tablet Take 1 tablet by mouth every 4 (four) hours as needed (breakthrough pain). 07/11/17   Stilwell, Bryson L, PA-C  levofloxacin (LEVAQUIN) 750 MG tablet Take 1 tablet (750 mg total) by mouth daily. 07/14/17   Stilwell, Bryson L, PA-C  ondansetron (ZOFRAN ODT) 8 MG disintegrating tablet Take 1 tablet (8 mg total) by mouth every 8 (eight) hours as needed for nausea or vomiting. 07/28/17   Carlyle Basques, MD  pravastatin (PRAVACHOL) 20 MG tablet Take 1 tablet (20 mg total) by mouth daily at 6 PM. Patient not taking: Reported on 07/20/2015 01/05/15 09/09/15  Kathie Dike, MD    Family History Family History  Problem Relation Age of Onset  . Cancer Mother   . Parkinson's disease Father   . Alzheimer's disease Father     Social History Social History   Tobacco Use  . Smoking status: Current Every Day Smoker    Packs/day: 1.00    Years: 40.00    Pack years: 40.00    Types: Cigarettes  Start date: 02/02/1969  . Smokeless tobacco: Never Used  Substance Use Topics  . Alcohol use: Yes    Alcohol/week: 0.0 standard drinks    Comment: occasional  . Drug use: Yes    Types: Marijuana    Comment: occasional      Allergies   Ibuprofen   Review of Systems Review of Systems  Constitutional: Negative for activity change, appetite change and fever.  HENT: Negative for congestion.   Respiratory: Negative for chest tightness and shortness of breath.   Cardiovascular: Negative for chest pain.  Gastrointestinal: Negative for abdominal pain, nausea and vomiting.  Genitourinary: Negative for dysuria and hematuria.  Musculoskeletal: Positive for arthralgias and  myalgias.  Skin: Negative for wound.  Neurological: Positive for weakness and numbness. Negative for facial asymmetry and speech difficulty.    all other systems are negative except as noted in the HPI and PMH.    Physical Exam Updated Vital Signs BP (!) 166/87   Pulse 67   Temp 98 F (36.7 C) (Oral)   Resp 14   Ht 5\' 9"  (1.753 m)   Wt 83.9 kg   SpO2 96%   BMI 27.32 kg/m   Physical Exam Vitals signs and nursing note reviewed.  Constitutional:      General: He is not in acute distress.    Appearance: He is well-developed.  HENT:     Head: Normocephalic and atraumatic.     Mouth/Throat:     Pharynx: No oropharyngeal exudate.  Eyes:     Conjunctiva/sclera: Conjunctivae normal.     Pupils: Pupils are equal, round, and reactive to light.  Neck:     Musculoskeletal: Normal range of motion and neck supple.     Comments: No meningismus. Cardiovascular:     Rate and Rhythm: Normal rate and regular rhythm.     Heart sounds: Normal heart sounds. No murmur.  Pulmonary:     Effort: Pulmonary effort is normal. No respiratory distress.     Breath sounds: Normal breath sounds.  Abdominal:     Palpations: Abdomen is soft.     Tenderness: There is no abdominal tenderness. There is no guarding or rebound.  Musculoskeletal: Normal range of motion.        General: No tenderness.  Skin:    General: Skin is warm.  Neurological:     Mental Status: He is alert and oriented to person, place, and time.     Cranial Nerves: No cranial nerve deficit.     Motor: No abnormal muscle tone.     Coordination: Coordination normal.     Comments: Cranial nerves II to XII are intact.  Speech is not slurred.  Tongue is midline.  There is slight pronator drift of the right arm with decreased grip strength but this appears secondary to pain.  5/5 strength of lower extremities, 5/5 strength of left upper extremity.  No ataxia on finger-to-nose. Patient with weakness in flexion and extension of right elbow  but this appears secondary to pain.  He denies any pain of his neck or shoulder. Subjective decreased sensation in RUE  Psychiatric:        Behavior: Behavior normal.      ED Treatments / Results  Labs (all labs ordered are listed, but only abnormal results are displayed) Labs Reviewed  COMPREHENSIVE METABOLIC PANEL - Abnormal; Notable for the following components:      Result Value   Potassium 3.4 (*)    Glucose, Bld 120 (*)  Calcium 8.5 (*)    All other components within normal limits  RAPID URINE DRUG SCREEN, HOSP PERFORMED - Abnormal; Notable for the following components:   Tetrahydrocannabinol POSITIVE (*)    All other components within normal limits  URINALYSIS, ROUTINE W REFLEX MICROSCOPIC - Abnormal; Notable for the following components:   Hgb urine dipstick SMALL (*)    All other components within normal limits  ETHANOL  PROTIME-INR  APTT  CBC  DIFFERENTIAL    EKG EKG Interpretation  Date/Time:  Thursday January 14 2019 22:42:09 EST Ventricular Rate:  82 PR Interval:    QRS Duration: 104 QT Interval:  399 QTC Calculation: 466 R Axis:   -60 Text Interpretation:  Sinus rhythm Left anterior fascicular block Abnormal R-wave progression, early transition No significant change was found Confirmed by Ezequiel Essex [phone]) on 01/14/2019 11:37:02 PM   Radiology Ct Head Wo Contrast  Result Date: 01/14/2019 CLINICAL DATA:  58 year old male with progressive right upper extremity tingling today. Prior stroke. EXAM: CT HEAD WITHOUT CONTRAST CT CERVICAL SPINE WITHOUT CONTRAST TECHNIQUE: Multidetector CT imaging of the head and cervical spine was performed following the standard protocol without intravenous contrast. Multiplanar CT image reconstructions of the cervical spine were also generated. COMPARISON:  Brain MRI 01/10/2017. Head CT 01/09/2017. Cervical spine CT 06/21/2014. FINDINGS: CT HEAD FINDINGS Brain: Chronic left cerebellar infarct in the PICA territory is  stable. Stable cerebral volume. Gray-white matter differentiation elsewhere remains normal. Vascular: No suspicious intracranial vascular hyperdensity. Skull: Negative. Sinuses/Orbits: Visualized paranasal sinuses and mastoids are stable and well pneumatized. Other: No acute orbit or scalp soft tissue findings. CT CERVICAL SPINE FINDINGS Alignment: Stable straightening of lordosis since 2015. Cervicothoracic junction alignment is within normal limits. Bilateral posterior element alignment is within normal limits. Skull base and vertebrae: Visualized skull base is intact. No atlanto-occipital dissociation. Motion artifact at the left TMJ. No acute osseous abnormality identified. Soft tissues and spinal canal: No prevertebral fluid or swelling. No visible canal hematoma. Negative noncontrast neck soft tissues aside from calcified carotid atherosclerosis. Disc levels: C2-C3:  Negative. No stenosis. C3-C4: Circumferential disc bulge and endplate spurring. No significant spinal stenosis suspected. Moderate to severe bilateral C4 foraminal stenosis appears chronic. C4-C5: Disc space loss with some vacuum disc. Circumferential disc bulge and endplate spurring. Mild spinal stenosis and moderate to severe bilateral C5 foraminal stenosis appears chronic. C5-C6: Circumferential disc bulge and endplate spurring with broad-based posterior component. Mild spinal stenosis suspected and appears chronic. Mild or at most moderate C6 foraminal stenosis greater on the right also appears stable. C6-C7: Chronic circumferential disc osteophyte complex. Mild if any spinal stenosis. Moderate bilateral C7 foraminal stenosis. This level appears stable. C7-T1:  Mild endplate and facet spurring. No definite stenosis. Upper chest: Negative. IMPRESSION: 1. Stable chronic left cerebellar infarct. No acute intracranial abnormality. 2. No acute osseous abnormality in the cervical spine. 3. Cervical spine degeneration appears largely stable since  2015. Mild spinal stenosis suspected at C4-C5, C5-C6, and possibly C6-C7. Moderate to severe degenerative neural foraminal stenosis at the bilateral C4, C5, and C7 nerve levels. Electronically Signed   By: Genevie Ann M.D.   On: 01/14/2019 23:44   Ct Cervical Spine Wo Contrast  Result Date: 01/14/2019 CLINICAL DATA:  58 year old male with progressive right upper extremity tingling today. Prior stroke. EXAM: CT HEAD WITHOUT CONTRAST CT CERVICAL SPINE WITHOUT CONTRAST TECHNIQUE: Multidetector CT imaging of the head and cervical spine was performed following the standard protocol without intravenous contrast. Multiplanar CT image reconstructions of the  cervical spine were also generated. COMPARISON:  Brain MRI 01/10/2017. Head CT 01/09/2017. Cervical spine CT 06/21/2014. 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 Stable cerebral volume. Gray-white matter differentiation elsewhere remains normal. Vascular: No suspicious intracranial vascular hyperdensity. Skull: Negative. Sinuses/Orbits: Visualized paranasal sinuses and mastoids are stable and well pneumatized. Other: No acute orbit or scalp soft tissue findings. CT CERVICAL SPINE FINDINGS Alignment: Stable straightening of lordosis since 2015. Cervicothoracic junction alignment is within normal limits. Bilateral posterior element alignment is within normal limits. Skull base and vertebrae: Visualized skull base is intact. No atlanto-occipital dissociation. Motion artifact at the left TMJ. No acute osseous abnormality identified. Soft tissues and spinal canal: No prevertebral fluid or swelling. No visible canal hematoma. Negative noncontrast neck soft tissues aside from calcified carotid atherosclerosis. Disc levels: C2-C3:  Negative. No stenosis. C3-C4: Circumferential disc bulge and endplate spurring. No significant spinal stenosis suspected. Moderate to severe bilateral C4 foraminal stenosis appears chronic.  C4-C5: Disc space loss with some vacuum disc. Circumferential disc bulge and endplate spurring. Mild spinal stenosis and moderate to severe bilateral C5 foraminal stenosis appears chronic. C5-C6: Circumferential disc bulge and endplate spurring with broad-based posterior component. Mild spinal stenosis suspected and appears chronic. Mild or at most moderate C6 foraminal stenosis greater on the right also appears stable. C6-C7: Chronic circumferential disc osteophyte complex. Mild if any spinal stenosis. Moderate bilateral C7 foraminal stenosis. This level appears stable. C7-T1:  Mild endplate and facet spurring. No definite stenosis. Upper chest: Negative. IMPRESSION: 1. Stable chronic left cerebellar infarct. No acute intracranial abnormality. 2. No acute osseous abnormality in the cervical spine. 3. Cervical spine degeneration appears largely stable since 2015. Mild spinal stenosis suspected at C4-C5, C5-C6, and possibly C6-C7. Moderate to severe degenerative neural foraminal stenosis at the bilateral C4, C5, and C7 nerve levels. Electronically Signed   By: Genevie Ann M.D.   On: 01/14/2019 23:44    Procedures Procedures (including critical care time)  Medications Ordered in ED Medications - No data to display   Initial Impression / Assessment and Plan / ED Course  I have reviewed the triage vital signs and the nursing notes.  Pertinent labs & imaging results that were available during my care of the patient were reviewed by me and considered in my medical decision making (see chart for details).    Right arm weakness and numbness progressively worsening over the past several weeks.  Now acutely worsening becoming more proximal with increased pain and weakness.  Code stroke not activated due to delay in presentation  Question component of cervical radiculopathy though patient is also having intermittent numbness in his right leg.  Discussed with tele-neurology who agrees with stroke work-up but no  emergent intervention.  CT shows old cerebellar infarct, no hemorrhage.  CT C-spine shows multilevel foraminal stenosis. Labs reassuring.  Suspect patient's right arm weakness and numbness may be due to cervical radiculopathy but cannot rule out stroke as he has also been having numbness in his leg and intermittent vision issues.  Neurology confirms not a candidate for emergent intervention but does recommend observation admission for stroke rule out and MRI in the morning.  Discussed with Dr. Darrick Meigs.  Final Clinical Impressions(s) / ED Diagnoses   Final diagnoses:  RUE weakness  Paresthesia    ED Discharge Orders    None       Terre Zabriskie, Annie Main, MD 01/15/19 0131

## 2019-01-14 NOTE — ED Triage Notes (Signed)
Pt states he has had some tingling in his right hand for "a while" and states today the tingling started moving up his right arm.  Pt states he had a stroke 4 years ago.

## 2019-01-15 ENCOUNTER — Observation Stay (HOSPITAL_COMMUNITY): Payer: Self-pay

## 2019-01-15 ENCOUNTER — Other Ambulatory Visit (HOSPITAL_COMMUNITY): Payer: Self-pay

## 2019-01-15 ENCOUNTER — Encounter (HOSPITAL_COMMUNITY): Payer: Self-pay

## 2019-01-15 DIAGNOSIS — E876 Hypokalemia: Secondary | ICD-10-CM

## 2019-01-15 DIAGNOSIS — Z720 Tobacco use: Secondary | ICD-10-CM

## 2019-01-15 DIAGNOSIS — G5601 Carpal tunnel syndrome, right upper limb: Secondary | ICD-10-CM

## 2019-01-15 DIAGNOSIS — R202 Paresthesia of skin: Secondary | ICD-10-CM

## 2019-01-15 DIAGNOSIS — R200 Anesthesia of skin: Principal | ICD-10-CM

## 2019-01-15 LAB — RAPID URINE DRUG SCREEN, HOSP PERFORMED
Amphetamines: NOT DETECTED
Barbiturates: NOT DETECTED
Benzodiazepines: NOT DETECTED
Cocaine: NOT DETECTED
Opiates: NOT DETECTED
Tetrahydrocannabinol: POSITIVE — AB

## 2019-01-15 LAB — URINALYSIS, ROUTINE W REFLEX MICROSCOPIC
Bacteria, UA: NONE SEEN
Bilirubin Urine: NEGATIVE
Glucose, UA: NEGATIVE mg/dL
Ketones, ur: NEGATIVE mg/dL
Leukocytes, UA: NEGATIVE
Nitrite: NEGATIVE
PROTEIN: NEGATIVE mg/dL
Specific Gravity, Urine: 1.014 (ref 1.005–1.030)
pH: 6 (ref 5.0–8.0)

## 2019-01-15 LAB — LIPID PANEL
Cholesterol: 124 mg/dL (ref 0–200)
HDL: 27 mg/dL — ABNORMAL LOW (ref >40)
LDL Cholesterol: 75 mg/dL (ref 0–99)
Total CHOL/HDL Ratio: 4.6 RATIO
Triglycerides: 111 mg/dL (ref <150)
VLDL: 22 mg/dL (ref 0–40)

## 2019-01-15 LAB — HEMOGLOBIN A1C
Hgb A1c MFr Bld: 5.5 % (ref 4.8–5.6)
Mean Plasma Glucose: 111.15 mg/dL

## 2019-01-15 MED ORDER — POTASSIUM CHLORIDE CRYS ER 20 MEQ PO TBCR
20.0000 meq | EXTENDED_RELEASE_TABLET | Freq: Once | ORAL | Status: AC
  Administered 2019-01-15: 20 meq via ORAL

## 2019-01-15 MED ORDER — HYDROCODONE-ACETAMINOPHEN 7.5-325 MG PO TABS: 1.0000 | ORAL_TABLET | ORAL | Status: DC | PRN

## 2019-01-15 MED ORDER — ACETAMINOPHEN 160 MG/5ML PO SOLN: 650.0000 mg | ORAL | Status: DC | PRN

## 2019-01-15 MED ORDER — ENOXAPARIN SODIUM 40 MG/0.4ML ~~LOC~~ SOLN
40.0000 mg | SUBCUTANEOUS | Status: DC
  Filled 2019-01-15: qty 0.4

## 2019-01-15 MED ORDER — POTASSIUM CHLORIDE CRYS ER 20 MEQ PO TBCR
EXTENDED_RELEASE_TABLET | ORAL | Status: AC
  Filled 2019-01-15: qty 1

## 2019-01-15 MED ORDER — ASPIRIN 300 MG RE SUPP: 300.0000 mg | Freq: Every day | RECTAL | Status: DC

## 2019-01-15 MED ORDER — ASPIRIN 325 MG PO TABS
325.0000 mg | ORAL_TABLET | Freq: Every day | ORAL | Status: DC
  Administered 2019-01-15: 325 mg via ORAL
  Filled 2019-01-15: qty 1

## 2019-01-15 MED ORDER — SODIUM CHLORIDE 0.9 % IV SOLN: INTRAVENOUS | Status: DC

## 2019-01-15 MED ORDER — ACETAMINOPHEN 325 MG PO TABS: 650.0000 mg | ORAL_TABLET | ORAL | Status: DC | PRN

## 2019-01-15 MED ORDER — ACETAMINOPHEN 650 MG RE SUPP: 650.0000 mg | RECTAL | Status: DC | PRN

## 2019-01-15 MED ORDER — STROKE: EARLY STAGES OF RECOVERY BOOK
Freq: Once | Status: AC
  Administered 2019-01-15: 12:00:00
  Filled 2019-01-15: qty 1

## 2019-01-15 NOTE — ED Notes (Signed)
ED TO INPATIENT HANDOFF REPORT  Name/Age/Gender Jason Walsh 58 y.o. male  Code Status Code Status History    Date Active Date Inactive Code Status Order ID Comments User Context   07/10/2017 2006 07/14/2017 2024 Full Code [id number]  Rowan Blase Inpatient   01/10/2017 0133 01/10/2017 1529 DNR [id number]  Holley Raring, MD Inpatient   01/10/2017 0112 01/10/2017 0133 Full Code [id number]  Burgess Estelle, MD Inpatient   01/03/2015 1631 01/05/2015 2155 Full Code [id number]  Doree Albee, MD ED      Home/SNF/Other Home  Chief Complaint Arm pain and tingling   Level of Care/Admitting Diagnosis ED Disposition    ED Disposition Condition [facility]: [facility] [[id number]]  Level of Care: Telemetry [5]  Diagnosis: Numbness and tingling of right arm [638756]  Admitting Physician: Oswald Hillock [place]  Attending Physician: Oswald Hillock [[id number]]  PT Class (Do Not Modify): Observation [104]  PT Acc Code (Do Not Modify): Observation [10022]       Medical History Past Medical History:  Diagnosis Date  . Cancer ([place])    skin  . Lumbar disc disorder   . Stroke [facility])     Allergies Allergies  Allergen Reactions  . Ibuprofen Other (See Comments)    Chest pains    IV Location/Drains/Wounds Patient Lines/Drains/Airways Status   Active Line/Drains/Airways    Name:   Placement date:   Placement time:   Site:   Days:   Peripheral IV 01/14/19 Right Antecubital   01/14/19    2252    Antecubital   1   PICC Single Lumen 43/32/95 PICC Right Basilic 39 cm 0 cm   18/84/16    6063    Basilic   016   Closed System Drain 1 Right Knee Accordion (Hemovac) 10 Fr.   07/10/17    1829    Knee   554   Incision (Closed) 07/10/17 Knee Right   07/10/17    0109     323          Labs/Imaging Results for orders placed or performed during the hospital encounter of 01/14/19 (from the past 48 hour(s))  Ethanol     Status: None   Collection Time: 01/14/19 11:06 PM   Result Value Ref Range   Alcohol, Ethyl (B) <10 <10 mg/dL    Comment: (NOTE) Lowest detectable limit for serum alcohol is 10 mg/dL. For medical purposes only. Performed at [facility], [address], [place], [place] [address]   Protime-INR     Status: None   Collection Time: 01/14/19 11:06 PM  Result Value Ref Range   Prothrombin Time 12.3 11.4 - 15.2 seconds   INR 0.92     Comment: Performed at [facility], [address] [place], [place] [address]  APTT     Status: None   Collection Time: 01/14/19 11:06 PM  Result Value Ref Range   aPTT 29 24 - 36 seconds    Comment: Performed at [facility], [address], [place], [place] [address]  CBC     Status: None   Collection Time: 01/14/19 11:06 PM  Result Value Ref Range   WBC 10.4 4.0 - 10.5 K/uL   RBC 4.58 4.22 - 5.81 MIL/uL   Hemoglobin 15.1 13.0 - 17.0 g/dL   HCT 43.7 39.0 - 52.0 %   MCV 95.4 80.0 - 100.0 fL   MCH 33.0 26.0 - 34.0 pg   MCHC 34.6  30.0 - 36.0 g/dL   RDW 12.1 11.5 - 15.5 %   Platelets 288 150 - 400 K/uL   nRBC 0.0 0.0 - 0.2 %    Comment: Performed at [facility], [address], [place], [place] [address]  Differential     Status: None   Collection Time: 01/14/19 11:06 PM  Result Value Ref Range   Neutrophils Relative % 63 %   Neutro Abs 6.5 1.7 - 7.7 K/uL   Lymphocytes Relative 30 %   Lymphs Abs 3.1 0.7 - 4.0 K/uL   Monocytes Relative 6 %   Monocytes Absolute 0.6 0.1 - 1.0 K/uL   Eosinophils Relative 1 %   Eosinophils Absolute 0.1 0.0 - 0.5 K/uL   Basophils Relative 0 %   Basophils Absolute 0.0 0.0 - 0.1 K/uL   Immature Granulocytes 0 %   Abs Immature Granulocytes 0.02 0.00 - 0.07 K/uL    Comment: Performed at [facility], [address], [place], [place] [address]  Comprehensive metabolic panel     Status: Abnormal   Collection Time: 01/14/19 11:06 PM  Result Value Ref Range   Sodium 138 135 - 145 mmol/L   Potassium 3.4 (L) 3.5 - 5.1 mmol/L   Chloride 107 98 - 111 mmol/L   CO2 24 22 - 32  mmol/L   Glucose, Bld 120 (H) 70 - 99 mg/dL   BUN 16 6 - 20 mg/dL   Creatinine, Ser 1.03 0.61 - 1.24 mg/dL   Calcium 8.5 (L) 8.9 - 10.3 mg/dL   Total Protein 6.5 6.5 - 8.1 g/dL   Albumin 4.1 3.5 - 5.0 g/dL   AST 22 15 - 41 U/L   ALT 26 0 - 44 U/L   Alkaline Phosphatase 67 38 - 126 U/L   Total Bilirubin 0.6 0.3 - 1.2 mg/dL   GFR calc non Af Amer >60 >60 mL/min   GFR calc Af Amer >60 >60 mL/min   Anion gap 7 5 - 15    Comment: Performed at [facility], [address] [place], [place] [address]  Urine rapid drug screen (hosp performed)     Status: Abnormal   Collection Time: 01/15/19 12:06 AM  Result Value Ref Range   Opiates NONE DETECTED NONE DETECTED   Cocaine NONE DETECTED NONE DETECTED   Benzodiazepines NONE DETECTED NONE DETECTED   Amphetamines NONE DETECTED NONE DETECTED   Tetrahydrocannabinol POSITIVE (A) NONE DETECTED   Barbiturates NONE DETECTED NONE DETECTED    Comment: (NOTE) DRUG SCREEN FOR MEDICAL PURPOSES ONLY.  IF CONFIRMATION IS NEEDED FOR ANY PURPOSE, NOTIFY LAB WITHIN 5 DAYS. LOWEST DETECTABLE LIMITS FOR URINE DRUG SCREEN Drug Class                     Cutoff (ng/mL) Amphetamine and metabolites    1000 Barbiturate and metabolites    200 Benzodiazepine                 174 Tricyclics and metabolites     300 Opiates and metabolites        300 Cocaine and metabolites        300 THC                            50 Performed at [facility], [address] [place], [place] [address]   Urinalysis, Routine w reflex microscopic     Status: Abnormal   Collection Time: 01/15/19 12:06 AM  Result Value Ref Range   Color, Urine YELLOW YELLOW   APPearance CLEAR CLEAR   Specific Gravity, Urine 1.014 1.005 - 1.030   pH 6.0 5.0 - 8.0   Glucose, UA NEGATIVE NEGATIVE mg/dL   Hgb urine dipstick SMALL (A) NEGATIVE   Bilirubin Urine NEGATIVE NEGATIVE   Ketones, ur NEGATIVE NEGATIVE mg/dL   Protein, ur NEGATIVE NEGATIVE mg/dL   Nitrite NEGATIVE NEGATIVE   Leukocytes, UA  NEGATIVE NEGATIVE   RBC / HPF 0-5 0 - 5 RBC/hpf   WBC, UA 0-5 0 - 5 WBC/hpf   Bacteria, UA NONE SEEN NONE SEEN   Squamous Epithelial / LPF 0-5 0 - 5    Comment: Performed at [facility], [address], [place], [place] [address]   Ct Head Wo Contrast  Result Date: 01/14/2019 CLINICAL DATA:  58 year old male with progressive right upper extremity tingling today. Prior stroke. EXAM: CT HEAD WITHOUT CONTRAST CT CERVICAL SPINE WITHOUT CONTRAST TECHNIQUE: Multidetector CT imaging of the head and cervical spine was performed following the standard protocol without intravenous contrast. Multiplanar CT image reconstructions of the cervical spine were also generated. COMPARISON:  Brain MRI 01/10/2017. Head CT 01/09/2017. Cervical spine CT 06/21/2014. 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 Stable cerebral volume. Gray-white matter differentiation elsewhere remains normal. Vascular: No suspicious intracranial vascular hyperdensity. Skull: Negative. Sinuses/Orbits: Visualized paranasal sinuses and mastoids are stable and well pneumatized. Other: No acute orbit or scalp soft tissue findings. CT CERVICAL SPINE FINDINGS Alignment: Stable straightening of lordosis since 2015. Cervicothoracic junction alignment is within normal limits. Bilateral posterior element alignment is within normal limits. Skull base and vertebrae: Visualized skull base is intact. No atlanto-occipital dissociation. Motion artifact at the left TMJ. No acute osseous abnormality identified. Soft tissues and spinal canal: No prevertebral fluid or swelling. No visible canal hematoma. Negative noncontrast neck soft tissues aside from calcified carotid atherosclerosis. Disc levels: C2-C3:  Negative. No stenosis. C3-C4: Circumferential disc bulge and endplate spurring. No significant spinal stenosis suspected. Moderate to severe bilateral C4 foraminal stenosis appears chronic. C4-C5: Disc space  loss with some vacuum disc. Circumferential disc bulge and endplate spurring. Mild spinal stenosis and moderate to severe bilateral C5 foraminal stenosis appears chronic. C5-C6: Circumferential disc bulge and endplate spurring with broad-based posterior component. Mild spinal stenosis suspected and appears chronic. Mild or at most moderate C6 foraminal stenosis greater on the right also appears stable. C6-C7: Chronic circumferential disc osteophyte complex. Mild if any spinal stenosis. Moderate bilateral C7 foraminal stenosis. This level appears stable. C7-T1:  Mild endplate and facet spurring. No definite stenosis. Upper chest: Negative. IMPRESSION: 1. Stable chronic left cerebellar infarct. No acute intracranial abnormality. 2. No acute osseous abnormality in the cervical spine. 3. Cervical spine degeneration appears largely stable since 2015. Mild spinal stenosis suspected at C4-C5, C5-C6, and possibly C6-C7. Moderate to severe degenerative neural foraminal stenosis at the bilateral C4, C5, and C7 nerve levels. Electronically Signed   By: Genevie Ann M.D.   On: 01/14/2019 23:44   Ct Cervical Spine Wo Contrast  Result Date: 01/14/2019 CLINICAL DATA:  58 year old male with progressive right upper extremity tingling today. Prior stroke. EXAM: CT HEAD WITHOUT CONTRAST CT CERVICAL SPINE WITHOUT CONTRAST TECHNIQUE: Multidetector CT imaging of the head and cervical spine was performed following the standard protocol without intravenous contrast. Multiplanar CT image reconstructions of the cervical spine were also generated. COMPARISON:  Brain MRI 01/10/2017. Head CT 01/09/2017. Cervical spine CT 06/21/2014. 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 Stable cerebral volume. Gray-white matter differentiation elsewhere remains normal. Vascular: No suspicious intracranial vascular hyperdensity. Skull: Negative. Sinuses/Orbits: Visualized paranasal sinuses and mastoids  are stable and well pneumatized. Other: No acute orbit or scalp soft tissue findings. CT CERVICAL SPINE FINDINGS Alignment: Stable straightening of lordosis since 2015. Cervicothoracic junction alignment is within normal limits. Bilateral posterior element alignment is within normal limits. Skull base and vertebrae: Visualized skull base is intact. No atlanto-occipital dissociation. Motion artifact at the left TMJ. No acute osseous abnormality identified. Soft tissues and spinal canal: No prevertebral fluid or swelling. No visible canal hematoma. Negative noncontrast neck soft tissues aside from calcified carotid atherosclerosis. Disc levels: C2-C3:  Negative. No stenosis. C3-C4: Circumferential disc bulge and endplate spurring. No significant spinal stenosis suspected. Moderate to severe bilateral C4 foraminal stenosis appears chronic. C4-C5: Disc space loss with some vacuum disc. Circumferential disc bulge and endplate spurring. Mild spinal stenosis and moderate to severe bilateral C5 foraminal stenosis appears chronic. C5-C6: Circumferential disc bulge and endplate spurring with broad-based posterior component. Mild spinal stenosis suspected and appears chronic. Mild or at most moderate C6 foraminal stenosis greater on the right also appears stable. C6-C7: Chronic circumferential disc osteophyte complex. Mild if any spinal stenosis. Moderate bilateral C7 foraminal stenosis. This level appears stable. C7-T1:  Mild endplate and facet spurring. No definite stenosis. Upper chest: Negative. IMPRESSION: 1. Stable chronic left cerebellar infarct. No acute intracranial abnormality. 2. No acute osseous abnormality in the cervical spine. 3. Cervical spine degeneration appears largely stable since 2015. Mild spinal stenosis suspected at C4-C5, C5-C6, and possibly C6-C7. Moderate to severe degenerative neural foraminal stenosis at the bilateral C4, C5, and C7 nerve levels. Electronically Signed   By: Genevie Ann M.D.   On:  01/14/2019 23:44    Pending Labs [employer] (From admission, onward)    Start     Ordered   Signed and Held  HIV antibody (Routine Testing)  Once,   R     Signed and Held   Signed and Held  Hemoglobin A1c  Tomorrow morning,   R     Signed and Held   Signed and Held  Lipid panel  Tomorrow morning,   R    Comments:  Fasting    Signed and Held   Signed and Held  CBC  (enoxaparin (LOVENOX)    CrCl >/= 30 ml/min)  Once,   R    Comments:  Baseline for enoxaparin therapy IF NOT ALREADY DRAWN.  Notify MD if PLT < 100 K.    Signed and Held   Signed and Held  Creatinine, serum  (enoxaparin (LOVENOX)    CrCl >/= 30 ml/min)  Once,   R    Comments:  Baseline for enoxaparin therapy IF NOT ALREADY DRAWN.    Signed and Held   Signed and Held  Creatinine, serum  (enoxaparin (LOVENOX)    CrCl >/= 30 ml/min)  Weekly,   R    Comments:  while on enoxaparin therapy    Signed and Held          Vitals/Pain Today's Vitals   01/14/19 2245 01/14/19 2300 01/14/19 2330 01/15/19 0030  BP: (!) 176/106 (!) 166/87 (!) 151/76 (!) 166/95  Pulse: 66 67 65 61  Resp: 16 14 14 12   Temp: 98 F (36.7 C)     TempSrc: Oral     SpO2: 99% 96% 96% 96%  Weight:  Height:        Isolation Precautions No active isolations  Medications Medications  potassium chloride SA (K-DUR,KLOR-CON) CR tablet 20 mEq (has no administration in time range)    Mobility walks

## 2019-01-15 NOTE — H&P (Addendum)
TRH H&P    Patient Demographics:    Jason Walsh, is a 58 y.o. male  MRN: [id number]  DOB - [birth year]  Admit Date - 01/14/2019  Referring MD/NP/PA: Derrek Monaco  Outpatient Primary MD for the patient is Patient, No Pcp Per  Patient coming from: Home  Chief complaint-right arm numbness   HPI:    Jason Walsh  is a 58 y.o. male, with history of hemorrhagic stroke, substance abuse,  hyperlipidemia came to ED with complaints of numbness and tingling with pain in the right arm.  Patient says that he has been having intermittent numbness and tingling over past several weeks.  This was very limited to the wrist of right hand.  But today numbness progressed up to his axilla with pain and weakness of arm.  He denies slurred speech.  Has been having blurry vision over the past several weeks.  Denies numbness or tingling of right lower extremity, but patient says that his right leg gets numbness as it was involved in an accident many years ago. He denies chest pain, no shortness of breath. No previous history of seizures. Denies difficulty swallowing He denies nausea vomiting or diarrhea. Denies dysuria Denies abdominal pain.  CT of cervical spine showed multilevel disc degenerative disease, ED physician discussed with tele neurology.  Who recommended observation and stroke work-up.  No acute intervention recommended at this time.n   Review of systems:    In addition to the HPI above,   All other systems reviewed and are negative.    Past History of the following :    Past Medical History:  Diagnosis Date  . Cancer ([place])    skin  . Lumbar disc disorder   . Stroke [facility])       Past Surgical History:  Procedure Laterality Date  . IRRIGATION AND DEBRIDEMENT KNEE Right 07/10/2017   Procedure: RIGHT KNEE SCOPE IRRIGATION AND DEBRIDEMENT;  Surgeon: Sydnee Cabal, MD;  Location: [facility];  Service: Orthopedics;   Laterality: Right;  . NOSE SURGERY        Social History:      Social History   Tobacco Use  . Smoking status: Current Every Day Smoker    Packs/day: 1.00    Years: 40.00    Pack years: 40.00    Types: Cigarettes    Start date: 02/02/1969  . Smokeless tobacco: Never Used  Substance Use Topics  . Alcohol use: Yes    Alcohol/week: 0.0 standard drinks    Comment: occasional       Family History :     Family History  Problem Relation Age of Onset  . Cancer Mother   . Parkinson's disease Father   . Alzheimer's disease Father       Home Medications:   Prior to Admission medications   Medication Sig Start Date End Date Taking? Authorizing Provider  aspirin EC 325 MG EC tablet Take 1 tablet (325 mg total) by mouth 2 (two) times daily. 07/11/17   Stilwell, Bryson L, PA-C  DAPTOmycin 668 mg in sodium chloride  0.9 % 100 mL Inject 668 mg into the vein daily. 07/14/17   Stilwell, Sueanne Margarita, PA-C  HYDROcodone-acetaminophen (NORCO) 7.5-325 MG tablet Take 1 tablet by mouth every 4 (four) hours as needed (breakthrough pain). 07/11/17   Stilwell, Bryson L, PA-C  levofloxacin (LEVAQUIN) 750 MG tablet Take 1 tablet (750 mg total) by mouth daily. 07/14/17   Stilwell, Bryson L, PA-C  ondansetron (ZOFRAN ODT) 8 MG disintegrating tablet Take 1 tablet (8 mg total) by mouth every 8 (eight) hours as needed for nausea or vomiting. 07/28/17   Carlyle Basques, MD  pravastatin (PRAVACHOL) 20 MG tablet Take 1 tablet (20 mg total) by mouth daily at 6 PM. Patient not taking: Reported on 07/20/2015 01/05/15 09/09/15  Kathie Dike, MD     Allergies:     Allergies  Allergen Reactions  . Ibuprofen Other (See Comments)    Chest pains     Physical Exam:   Vitals  Blood pressure (!) 166/95, pulse 61, temperature 98 F (36.7 C), temperature source Oral, resp. rate 12, height 5\' 9"  (1.753 m), weight 83.9 kg, SpO2 96 %.  1.  General: Appears in no acute distress  2. Psychiatric: Alert, oriented x3,  intact insight and judgment  3. Neurologic: Cranial nerves II to 12 grossly intact, motor strength 4/5 in right upper extremity, 5/5 in all other extremities.  Sensations intact.  4. HEENMT:  Atraumatic normocephalic, oral mucosa is pink and moist.  5. Respiratory : Clear to auscultation bilaterally, no wheezing or crackles.  6. Cardiovascular : S1-S2, regular, no murmur auscultated  7. Gastrointestinal:  Abdomen is soft, nontender, no organomegaly  8. Skin:  No rash noted.      Data Review:    CBC Recent Labs  Lab 01/14/19 2306  WBC 10.4  HGB 15.1  HCT 43.7  PLT 288  MCV 95.4  MCH 33.0  MCHC 34.6  RDW 12.1  LYMPHSABS 3.1  MONOABS 0.6  EOSABS 0.1  BASOSABS 0.0   ------------------------------------------------------------------------------------------------------------------  Results for orders placed or performed during the hospital encounter of 01/14/19 (from the past 48 hour(s))  Ethanol     Status: None   Collection Time: 01/14/19 11:06 PM  Result Value Ref Range   Alcohol, Ethyl (B) <10 <10 mg/dL    Comment: (NOTE) Lowest detectable limit for serum alcohol is 10 mg/dL. For medical purposes only. Performed at [facility], [address] [place], [place] [address]   Protime-INR     Status: None   Collection Time: 01/14/19 11:06 PM  Result Value Ref Range   Prothrombin Time 12.3 11.4 - 15.2 seconds   INR 0.92     Comment: Performed at [facility], [address], [place], [place] [address]  APTT     Status: None   Collection Time: 01/14/19 11:06 PM  Result Value Ref Range   aPTT 29 24 - 36 seconds    Comment: Performed at [facility], [address] [place], [place] [address]  CBC     Status: None   Collection Time: 01/14/19 11:06 PM  Result Value Ref Range   WBC 10.4 4.0 - 10.5 K/uL   RBC 4.58 4.22 - 5.81 MIL/uL   Hemoglobin 15.1 13.0 - 17.0 g/dL   HCT 43.7 39.0 - 52.0 %   MCV 95.4 80.0 - 100.0 fL   MCH 33.0 26.0 - 34.0 pg   MCHC 34.6  30.0 - 36.0 g/dL   RDW 12.1 11.5 - 15.5 %   Platelets 288 150 -  400 K/uL   nRBC 0.0 0.0 - 0.2 %    Comment: Performed at [facility], [address], [place], [place] [address]  Differential     Status: None   Collection Time: 01/14/19 11:06 PM  Result Value Ref Range   Neutrophils Relative % 63 %   Neutro Abs 6.5 1.7 - 7.7 K/uL   Lymphocytes Relative 30 %   Lymphs Abs 3.1 0.7 - 4.0 K/uL   Monocytes Relative 6 %   Monocytes Absolute 0.6 0.1 - 1.0 K/uL   Eosinophils Relative 1 %   Eosinophils Absolute 0.1 0.0 - 0.5 K/uL   Basophils Relative 0 %   Basophils Absolute 0.0 0.0 - 0.1 K/uL   Immature Granulocytes 0 %   Abs Immature Granulocytes 0.02 0.00 - 0.07 K/uL    Comment: Performed at [facility], [address], [place], [place] [address]  Comprehensive metabolic panel     Status: Abnormal   Collection Time: 01/14/19 11:06 PM  Result Value Ref Range   Sodium 138 135 - 145 mmol/L   Potassium 3.4 (L) 3.5 - 5.1 mmol/L   Chloride 107 98 - 111 mmol/L   CO2 24 22 - 32 mmol/L   Glucose, Bld 120 (H) 70 - 99 mg/dL   BUN 16 6 - 20 mg/dL   Creatinine, Ser 1.03 0.61 - 1.24 mg/dL   Calcium 8.5 (L) 8.9 - 10.3 mg/dL   Total Protein 6.5 6.5 - 8.1 g/dL   Albumin 4.1 3.5 - 5.0 g/dL   AST 22 15 - 41 U/L   ALT 26 0 - 44 U/L   Alkaline Phosphatase 67 38 - 126 U/L   Total Bilirubin 0.6 0.3 - 1.2 mg/dL   GFR calc non Af Amer >60 >60 mL/min   GFR calc Af Amer >60 >60 mL/min   Anion gap 7 5 - 15    Comment: Performed at [facility], [address] [place], [place] [address]  Urine rapid drug screen (hosp performed)     Status: Abnormal   Collection Time: 01/15/19 12:06 AM  Result Value Ref Range   Opiates NONE DETECTED NONE DETECTED   Cocaine NONE DETECTED NONE DETECTED   Benzodiazepines NONE DETECTED NONE DETECTED   Amphetamines NONE DETECTED NONE DETECTED   Tetrahydrocannabinol POSITIVE (A) NONE DETECTED   Barbiturates NONE DETECTED NONE DETECTED    Comment: (NOTE) DRUG SCREEN FOR  MEDICAL PURPOSES ONLY.  IF CONFIRMATION IS NEEDED FOR ANY PURPOSE, NOTIFY LAB WITHIN 5 DAYS. LOWEST DETECTABLE LIMITS FOR URINE DRUG SCREEN Drug Class                     Cutoff (ng/mL) Amphetamine and metabolites    1000 Barbiturate and metabolites    200 Benzodiazepine                 093 Tricyclics and metabolites     300 Opiates and metabolites        300 Cocaine and metabolites        300 THC                            50 Performed at [facility], [address], [place], [place] [address]   Urinalysis, Routine w reflex microscopic     Status: Abnormal   Collection Time: 01/15/19 12:06 AM  Result Value Ref Range   Color, Urine YELLOW YELLOW   APPearance CLEAR CLEAR   Specific  Gravity, Urine 1.014 1.005 - 1.030   pH 6.0 5.0 - 8.0   Glucose, UA NEGATIVE NEGATIVE mg/dL   Hgb urine dipstick SMALL (A) NEGATIVE   Bilirubin Urine NEGATIVE NEGATIVE   Ketones, ur NEGATIVE NEGATIVE mg/dL   Protein, ur NEGATIVE NEGATIVE mg/dL   Nitrite NEGATIVE NEGATIVE   Leukocytes, UA NEGATIVE NEGATIVE   RBC / HPF 0-5 0 - 5 RBC/hpf   WBC, UA 0-5 0 - 5 WBC/hpf   Bacteria, UA NONE SEEN NONE SEEN   Squamous Epithelial / LPF 0-5 0 - 5    Comment: Performed at [facility], [address], [place], [place] [address]    Chemistries  Recent Labs  Lab 01/14/19 2306  NA 138  K 3.4*  CL 107  CO2 24  GLUCOSE 120*  BUN 16  CREATININE 1.03  CALCIUM 8.5*  AST 22  ALT 26  ALKPHOS 67  BILITOT 0.6   ------------------------------------------------------------------------------------------------------------------  ------------------------------------------------------------------------------------------------------------------ GFR: Estimated Creatinine Clearance: 78.2 mL/min (by C-G formula based on SCr of 1.03 mg/dL). Liver Function Tests: Recent Labs  Lab 01/14/19 2306  AST 22  ALT 26  ALKPHOS 67  BILITOT 0.6  PROT 6.5  ALBUMIN 4.1   No results for input(s): LIPASE, AMYLASE in the  last 168 hours. No results for input(s): AMMONIA in the last 168 hours. Coagulation Profile: Recent Labs  Lab 01/14/19 2306  INR 0.92   Cardiac Enzymes: No results for input(s): CKTOTAL, CKMB, CKMBINDEX, TROPONINI in the last 168 hours. BNP (last 3 results) No results for input(s): PROBNP in the last 8760 hours. HbA1C: No results for input(s): HGBA1C in the last 72 hours. CBG: No results for input(s): GLUCAP in the last 168 hours. Lipid Profile: No results for input(s): CHOL, HDL, LDLCALC, TRIG, CHOLHDL, LDLDIRECT in the last 72 hours. Thyroid Function Tests: No results for input(s): TSH, T4TOTAL, FREET4, T3FREE, THYROIDAB in the last 72 hours. Anemia Panel: No results for input(s): VITAMINB12, FOLATE, FERRITIN, TIBC, IRON, RETICCTPCT in the last 72 hours.  --------------------------------------------------------------------------------------------------------------- Urine analysis:    Component Value Date/Time   COLORURINE YELLOW 01/15/2019 0006   APPEARANCEUR CLEAR 01/15/2019 0006   LABSPEC 1.014 01/15/2019 0006   PHURINE 6.0 01/15/2019 0006   GLUCOSEU NEGATIVE 01/15/2019 0006   HGBUR SMALL (A) 01/15/2019 0006   BILIRUBINUR NEGATIVE 01/15/2019 0006   KETONESUR NEGATIVE 01/15/2019 0006   PROTEINUR NEGATIVE 01/15/2019 0006   NITRITE NEGATIVE 01/15/2019 0006   LEUKOCYTESUR NEGATIVE 01/15/2019 0006      Imaging Results:    Ct Head Wo Contrast  Result Date: 01/14/2019 CLINICAL DATA:  58 year old male with progressive right upper extremity tingling today. Prior stroke. EXAM: CT HEAD WITHOUT CONTRAST CT CERVICAL SPINE WITHOUT CONTRAST TECHNIQUE: Multidetector CT imaging of the head and cervical spine was performed following the standard protocol without intravenous contrast. Multiplanar CT image reconstructions of the cervical spine were also generated. COMPARISON:  Brain MRI 01/10/2017. Head CT 01/09/2017. Cervical spine CT 06/21/2014. FINDINGS: CT HEAD FINDINGS Brain: Chronic  left cerebellar infarct in the PICA territory is stable. Stable cerebral volume. Gray-white matter differentiation elsewhere remains normal. Vascular: No suspicious intracranial vascular hyperdensity. Skull: Negative. Sinuses/Orbits: Visualized paranasal sinuses and mastoids are stable and well pneumatized. Other: No acute orbit or scalp soft tissue findings. CT CERVICAL SPINE FINDINGS Alignment: Stable straightening of lordosis since 2015. Cervicothoracic junction alignment is within normal limits. Bilateral posterior element alignment is within normal limits. Skull base and vertebrae: Visualized skull base is intact. No atlanto-occipital dissociation. Motion artifact at the  left TMJ. No acute osseous abnormality identified. Soft tissues and spinal canal: No prevertebral fluid or swelling. No visible canal hematoma. Negative noncontrast neck soft tissues aside from calcified carotid atherosclerosis. Disc levels: C2-C3:  Negative. No stenosis. C3-C4: Circumferential disc bulge and endplate spurring. No significant spinal stenosis suspected. Moderate to severe bilateral C4 foraminal stenosis appears chronic. C4-C5: Disc space loss with some vacuum disc. Circumferential disc bulge and endplate spurring. Mild spinal stenosis and moderate to severe bilateral C5 foraminal stenosis appears chronic. C5-C6: Circumferential disc bulge and endplate spurring with broad-based posterior component. Mild spinal stenosis suspected and appears chronic. Mild or at most moderate C6 foraminal stenosis greater on the right also appears stable. C6-C7: Chronic circumferential disc osteophyte complex. Mild if any spinal stenosis. Moderate bilateral C7 foraminal stenosis. This level appears stable. C7-T1:  Mild endplate and facet spurring. No definite stenosis. Upper chest: Negative. IMPRESSION: 1. Stable chronic left cerebellar infarct. No acute intracranial abnormality. 2. No acute osseous abnormality in the cervical spine. 3. Cervical  spine degeneration appears largely stable since 2015. Mild spinal stenosis suspected at C4-C5, C5-C6, and possibly C6-C7. Moderate to severe degenerative neural foraminal stenosis at the bilateral C4, C5, and C7 nerve levels. Electronically Signed   By: Genevie Ann M.D.   On: 01/14/2019 23:44   Ct Cervical Spine Wo Contrast  Result Date: 01/14/2019 CLINICAL DATA:  58 year old male with progressive right upper extremity tingling today. Prior stroke. EXAM: CT HEAD WITHOUT CONTRAST CT CERVICAL SPINE WITHOUT CONTRAST TECHNIQUE: Multidetector CT imaging of the head and cervical spine was performed following the standard protocol without intravenous contrast. Multiplanar CT image reconstructions of the cervical spine were also generated. COMPARISON:  Brain MRI 01/10/2017. Head CT 01/09/2017. Cervical spine CT 06/21/2014. 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 Stable cerebral volume. Gray-white matter differentiation elsewhere remains normal. Vascular: No suspicious intracranial vascular hyperdensity. Skull: Negative. Sinuses/Orbits: Visualized paranasal sinuses and mastoids are stable and well pneumatized. Other: No acute orbit or scalp soft tissue findings. CT CERVICAL SPINE FINDINGS Alignment: Stable straightening of lordosis since 2015. Cervicothoracic junction alignment is within normal limits. Bilateral posterior element alignment is within normal limits. Skull base and vertebrae: Visualized skull base is intact. No atlanto-occipital dissociation. Motion artifact at the left TMJ. No acute osseous abnormality identified. Soft tissues and spinal canal: No prevertebral fluid or swelling. No visible canal hematoma. Negative noncontrast neck soft tissues aside from calcified carotid atherosclerosis. Disc levels: C2-C3:  Negative. No stenosis. C3-C4: Circumferential disc bulge and endplate spurring. No significant spinal stenosis suspected. Moderate to severe  bilateral C4 foraminal stenosis appears chronic. C4-C5: Disc space loss with some vacuum disc. Circumferential disc bulge and endplate spurring. Mild spinal stenosis and moderate to severe bilateral C5 foraminal stenosis appears chronic. C5-C6: Circumferential disc bulge and endplate spurring with broad-based posterior component. Mild spinal stenosis suspected and appears chronic. Mild or at most moderate C6 foraminal stenosis greater on the right also appears stable. C6-C7: Chronic circumferential disc osteophyte complex. Mild if any spinal stenosis. Moderate bilateral C7 foraminal stenosis. This level appears stable. C7-T1:  Mild endplate and facet spurring. No definite stenosis. Upper chest: Negative. IMPRESSION: 1. Stable chronic left cerebellar infarct. No acute intracranial abnormality. 2. No acute osseous abnormality in the cervical spine. 3. Cervical spine degeneration appears largely stable since 2015. Mild spinal stenosis suspected at C4-C5, C5-C6, and possibly C6-C7. Moderate to severe degenerative neural foraminal stenosis at the bilateral C4, C5, and C7 nerve  levels. Electronically Signed   By: Genevie Ann M.D.   On: 01/14/2019 23:44    My personal review of EKG: Rhythm NSR   Assessment & Plan:    Active Problems:   Numbness and tingling of right arm   1. Right arm numbness-CT of the cervical spine showed multilevel disc degenerative disease.  Due to previous history of stroke, patient placed under observation to rule out stroke.  Will obtain MRI/MRI brain in a.m., echocardiogram, lipid profile.  Will consult neurology in a.m.  Aspirin 325 mg p.o. daily.  Neurochecks, stroke swallow screen, permissive hypertension.  2. Right arm pain-as above patient has disc degenerative disease in the cervical spine.  Continue Vicodin as needed for pain.    DVT Prophylaxis-   Lovenox   AM Labs Ordered, also please review Full Orders  Family Communication: Admission, patients condition and plan of care  including tests being ordered have been discussed with the patient  who indicate understanding and agree with the plan and Code Status.  Code Status: Full code  Admission status: Observation: Based on patients clinical presentation and evaluation of above clinical data, I have made determination that patient will need less than 2 midnight stay in the hospital.  Time spent in minutes : 60 minutes   Oswald Hillock M.D on 01/15/2019 at 12:53 AM

## 2019-01-15 NOTE — Progress Notes (Signed)
Patient states understanding of discharge instructions, stroke book given

## 2019-01-15 NOTE — Care Management Note (Signed)
Case Management Note  Patient Details  Name: Jason Walsh MRN: [id number] Date of Birth: 11-May-1961  Subjective/Objective:    Stroke workup. Lives alone, independent. No PCP or insurance. Discussed [facility]. He is agreeable. Will make appt. Flyer given.  Discussed OP OT referral. Agreeable. Will make referral.                 Action/Plan: DC home.   Expected Discharge Date:  01/15/19               Expected Discharge Plan:     In-House Referral:     Discharge planning Services   CM Consult  Post Acute Care Choice:   OP rehab Choice offered to:     DME Arranged:    DME Agency:     HH Arranged:    HH Agency:     Status of Service:     If discussed at [employer], dates discussed:    Additional Comments:  Estefanny Moler, [place], RN 01/15/2019, 12:59 PM

## 2019-01-15 NOTE — Discharge Summary (Signed)
Physician Discharge Summary  Jason Walsh ZJI:[id number] DOB: 11-Apr-1961 DOA: 01/14/2019  PCP: Patient, No Pcp Per  Admit date: 01/14/2019 Discharge date: 01/15/2019 Admitted From: Home Disposition: Home  Recommendations for Outpatient Follow-up:  1. [facility] on [facility] at 3 PM  Home Health: None Equipment/Devices: None  Discharge Condition: Fair CODE STATUS: Full code Diet recommendation: Heart Healthy      Discharge Diagnoses:   Principal problem   Numbness and tingling of right arm   Active Problems:   Tobacco abuse   Carpal tunnel syndrome of right wrist   Hypokalemia  Brief narrative/HPI 58 year old male with hemorrhagic stroke, polysubstance abuse, tobacco use and hyperlipidemia presented to the ED with numbness and tingling in his right arm and wrist for past several weeks.  He also reported some weakness in the arm.  Denied any slurred speech, reported off-and-on blurred vision to the admitting physician, headache, dizziness, chest pain, shortness of breath, palpitations, weakness or numbness of his lower extremities. CT of the head and cervical spine showed multilevel disc degeneration.  Tele-neurology was consulted by ED physician who recommended observation for stroke work-up.  Hospital course Right hand numbness MRI of the brain negative for stroke.  Carotid ultrasound without significant stenosis.  Patient during my examination informs having pain and numbness in his right wrist radiating to the ulnar aspect and occasionally up to the axilla.  The numbness and tingling also radiates to the ring and little finger and worsened on ulnar deviation of the wrist. Symptoms are likely associated with carpal tunnel syndrome rather than TIA. No further work-up needed.  Patient does not want to take aspirin cholesterol-lowering medication.  (His LDL is 75).   Carpal tunnel syndrome of right wrist Patient provided with written information, use of NSAIDs if  pain worsens and use of splint.  Ambulatory referral to Occupational Therapy.  He wants to follow-up at the outpatient clinic next week.  Tobacco abuse Counseled strongly on cessation.  Urine drug screen was positive for THC.  Hypokalemia Replenished   Procedure: CT head, CT cervical spine, carotid ultrasound, MRI brain and MRA head.  Consult: None Family communication: None at bedside Disposition: Home  Discharge Instructions  Discharge Instructions    Ambulatory referral to Occupational Therapy   Complete by:  As directed      Allergies as of 01/15/2019      Reactions   Ibuprofen Other (See Comments)   Chest pains      Medication List    You have not been prescribed any medications.    Follow-up Information    [facility] .   Specialty:  Radiology Contact information: [address] [id number] mc [place] [address] [place] .   Specialty:  Radiology Contact information: [address] [id number] mc [place] [address] [phone]       [place] .   Specialty:  CardioPulmonary Contact information: [address] [id number] [place] [phone]       [facility] Follow up.   Why:  Feb 11th at 3 pm,          Allergies  Allergen Reactions  . Ibuprofen Other (See Comments)    Chest pains        Procedures/Studies: Dg Chest 2 View  Result Date: 01/15/2019 CLINICAL DATA:  TIA, cough EXAM: CHEST - 2 VIEW COMPARISON:  01/09/2017 FINDINGS: The heart size and mediastinal  contours are within normal limits. Both lungs are clear. The visualized skeletal structures are unremarkable. IMPRESSION: No active cardiopulmonary disease. Electronically Signed   By: Kathreen Devoid   On: 01/15/2019 01:08   Ct Head Wo Contrast  Result Date: 01/14/2019 CLINICAL DATA:  58 year old male with progressive right upper extremity tingling  today. Prior stroke. EXAM: CT HEAD WITHOUT CONTRAST CT CERVICAL SPINE WITHOUT CONTRAST TECHNIQUE: Multidetector CT imaging of the head and cervical spine was performed following the standard protocol without intravenous contrast. Multiplanar CT image reconstructions of the cervical spine were also generated. COMPARISON:  Brain MRI 01/10/2017. Head CT 01/09/2017. Cervical spine CT 06/21/2014. 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 Stable cerebral volume. Gray-white matter differentiation elsewhere remains normal. Vascular: No suspicious intracranial vascular hyperdensity. Skull: Negative. Sinuses/Orbits: Visualized paranasal sinuses and mastoids are stable and well pneumatized. Other: No acute orbit or scalp soft tissue findings. CT CERVICAL SPINE FINDINGS Alignment: Stable straightening of lordosis since 2015. Cervicothoracic junction alignment is within normal limits. Bilateral posterior element alignment is within normal limits. Skull base and vertebrae: Visualized skull base is intact. No atlanto-occipital dissociation. Motion artifact at the left TMJ. No acute osseous abnormality identified. Soft tissues and spinal canal: No prevertebral fluid or swelling. No visible canal hematoma. Negative noncontrast neck soft tissues aside from calcified carotid atherosclerosis. Disc levels: C2-C3:  Negative. No stenosis. C3-C4: Circumferential disc bulge and endplate spurring. No significant spinal stenosis suspected. Moderate to severe bilateral C4 foraminal stenosis appears chronic. C4-C5: Disc space loss with some vacuum disc. Circumferential disc bulge and endplate spurring. Mild spinal stenosis and moderate to severe bilateral C5 foraminal stenosis appears chronic. C5-C6: Circumferential disc bulge and endplate spurring with broad-based posterior component. Mild spinal stenosis suspected and appears chronic. Mild or at most moderate C6 foraminal stenosis  greater on the right also appears stable. C6-C7: Chronic circumferential disc osteophyte complex. Mild if any spinal stenosis. Moderate bilateral C7 foraminal stenosis. This level appears stable. C7-T1:  Mild endplate and facet spurring. No definite stenosis. Upper chest: Negative. IMPRESSION: 1. Stable chronic left cerebellar infarct. No acute intracranial abnormality. 2. No acute osseous abnormality in the cervical spine. 3. Cervical spine degeneration appears largely stable since 2015. Mild spinal stenosis suspected at C4-C5, C5-C6, and possibly C6-C7. Moderate to severe degenerative neural foraminal stenosis at the bilateral C4, C5, and C7 nerve levels. Electronically Signed   By: Genevie Ann M.D.   On: 01/14/2019 23:44   Ct Cervical Spine Wo Contrast  Result Date: 01/14/2019 CLINICAL DATA:  58 year old male with progressive right upper extremity tingling today. Prior stroke. EXAM: CT HEAD WITHOUT CONTRAST CT CERVICAL SPINE WITHOUT CONTRAST TECHNIQUE: Multidetector CT imaging of the head and cervical spine was performed following the standard protocol without intravenous contrast. Multiplanar CT image reconstructions of the cervical spine were also generated. COMPARISON:  Brain MRI 01/10/2017. Head CT 01/09/2017. Cervical spine CT 06/21/2014. 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 Stable cerebral volume. Gray-white matter differentiation elsewhere remains normal. Vascular: No suspicious intracranial vascular hyperdensity. Skull: Negative. Sinuses/Orbits: Visualized paranasal sinuses and mastoids are stable and well pneumatized. Other: No acute orbit or scalp soft tissue findings. CT CERVICAL SPINE FINDINGS Alignment: Stable straightening of lordosis since 2015. Cervicothoracic junction alignment is within normal limits. Bilateral posterior element alignment is within normal limits. Skull base and vertebrae: Visualized skull base is intact. No  atlanto-occipital dissociation. Motion artifact at the left TMJ.  No acute osseous abnormality identified. Soft tissues and spinal canal: No prevertebral fluid or swelling. No visible canal hematoma. Negative noncontrast neck soft tissues aside from calcified carotid atherosclerosis. Disc levels: C2-C3:  Negative. No stenosis. C3-C4: Circumferential disc bulge and endplate spurring. No significant spinal stenosis suspected. Moderate to severe bilateral C4 foraminal stenosis appears chronic. C4-C5: Disc space loss with some vacuum disc. Circumferential disc bulge and endplate spurring. Mild spinal stenosis and moderate to severe bilateral C5 foraminal stenosis appears chronic. C5-C6: Circumferential disc bulge and endplate spurring with broad-based posterior component. Mild spinal stenosis suspected and appears chronic. Mild or at most moderate C6 foraminal stenosis greater on the right also appears stable. C6-C7: Chronic circumferential disc osteophyte complex. Mild if any spinal stenosis. Moderate bilateral C7 foraminal stenosis. This level appears stable. C7-T1:  Mild endplate and facet spurring. No definite stenosis. Upper chest: Negative. IMPRESSION: 1. Stable chronic left cerebellar infarct. No acute intracranial abnormality. 2. No acute osseous abnormality in the cervical spine. 3. Cervical spine degeneration appears largely stable since 2015. Mild spinal stenosis suspected at C4-C5, C5-C6, and possibly C6-C7. Moderate to severe degenerative neural foraminal stenosis at the bilateral C4, C5, and C7 nerve levels. Electronically Signed   By: Genevie Ann M.D.   On: 01/14/2019 23:44   Mr Brain Wo Contrast  Result Date: 01/15/2019 CLINICAL DATA:  Stroke.  Blurred vision. EXAM: MRI HEAD WITHOUT CONTRAST MRA HEAD WITHOUT CONTRAST TECHNIQUE: Multiplanar, multiecho pulse sequences of the brain and surrounding structures were obtained without intravenous contrast. Angiographic images of the head were obtained using MRA  technique without contrast. COMPARISON:  MRI head 01/10/2017 FINDINGS: MRI HEAD FINDINGS Brain: Negative for acute infarct. Chronic left PICA infarct unchanged from prior studies. Small hyperintensity right frontal white matter appears chronic. Negative for hemorrhage or mass lesion. Ventricle size normal. No midline shift. Vascular: Normal arterial flow voids Skull and upper cervical spine: Negative Sinuses/Orbits: Negative Other: None MRA HEAD FINDINGS Both vertebral arteries widely patent. PICA patent bilaterally. Basilar widely patent. Posterior cerebral arteries widely patent bilaterally. Posterior communicating artery patent bilaterally. Internal carotid artery widely patent bilaterally. Anterior and middle cerebral arteries widely patent without stenosis or occlusion Negative for cerebral aneurysm. IMPRESSION: Negative for acute infarct. Chronic left PICA infarct Negative MRA head Electronically Signed   By: Franchot Gallo M.D.   On: 01/15/2019 10:30   US Carotid Bilateral (at [facility] And [facility] Only)  Result Date: 01/15/2019 CLINICAL DATA:  58 year old male with a history of right upper extremity tingling EXAM: BILATERAL CAROTID DUPLEX ULTRASOUND TECHNIQUE: Pearline Cables scale imaging, color Doppler and duplex ultrasound were performed of bilateral carotid and vertebral arteries in the neck. COMPARISON:  None. FINDINGS: Criteria: Quantification of carotid stenosis is based on velocity parameters that correlate the residual internal carotid diameter with NASCET-based stenosis levels, using the diameter of the distal internal carotid lumen as the denominator for stenosis measurement. The following velocity measurements were obtained: RIGHT ICA:  Systolic 638 cm/sec, Diastolic 41 cm/sec CCA:  69 cm/sec SYSTOLIC ICA/CCA RATIO:  1.6 ECA:  102 cm/sec LEFT ICA:  Systolic 756 cm/sec, Diastolic 34 cm/sec CCA:  86 cm/sec SYSTOLIC ICA/CCA RATIO:  1.3 ECA:  98 cm/sec Right Brachial SBP: Not acquired Left Brachial SBP: Not acquired  RIGHT CAROTID ARTERY: No significant calcifications of the right common carotid artery. Intermediate waveform maintained. Heterogeneous and partially calcified plaque at the right carotid bifurcation. No significant lumen shadowing. Low resistance waveform of the right ICA. No significant tortuosity. RIGHT VERTEBRAL ARTERY: Antegrade flow  with low resistance waveform. LEFT CAROTID ARTERY: No significant calcifications of the left common carotid artery. Intermediate waveform maintained. Heterogeneous and partially calcified plaque at the left carotid bifurcation without significant lumen shadowing. Low resistance waveform of the left ICA. No significant tortuosity. LEFT VERTEBRAL ARTERY:  Antegrade flow with low resistance waveform. IMPRESSION: Color duplex indicates minimal heterogeneous and calcified plaque, with no hemodynamically significant stenosis by duplex criteria in the extracranial cerebrovascular circulation. Signed, Dulcy Fanny. Dellia Nims, RPVI [facility] Electronically Signed   By: Corrie Mckusick D.O.   On: 01/15/2019 09:28   Mr Jodene Nam Head/brain OZ Cm  Result Date: 01/15/2019 CLINICAL DATA:  Stroke.  Blurred vision. EXAM: MRI HEAD WITHOUT CONTRAST MRA HEAD WITHOUT CONTRAST TECHNIQUE: Multiplanar, multiecho pulse sequences of the brain and surrounding structures were obtained without intravenous contrast. Angiographic images of the head were obtained using MRA technique without contrast. COMPARISON:  MRI head 01/10/2017 FINDINGS: MRI HEAD FINDINGS Brain: Negative for acute infarct. Chronic left PICA infarct unchanged from prior studies. Small hyperintensity right frontal white matter appears chronic. Negative for hemorrhage or mass lesion. Ventricle size normal. No midline shift. Vascular: Normal arterial flow voids Skull and upper cervical spine: Negative Sinuses/Orbits: Negative Other: None MRA HEAD FINDINGS Both vertebral arteries widely patent.  PICA patent bilaterally. Basilar widely patent. Posterior cerebral arteries widely patent bilaterally. Posterior communicating artery patent bilaterally. Internal carotid artery widely patent bilaterally. Anterior and middle cerebral arteries widely patent without stenosis or occlusion Negative for cerebral aneurysm. IMPRESSION: Negative for acute infarct. Chronic left PICA infarct Negative MRA head Electronically Signed   By: Franchot Gallo M.D.   On: 01/15/2019 10:30     Subjective: Right hand numbness and tingling much better.  Discharge Exam: Vitals:   01/15/19 0800 01/15/19 1200  BP: 138/82 138/79  Pulse: (!) 57 61  Resp: 18 18  Temp: 98.5 F (36.9 C) 98.6 F (37 C)  SpO2: 99% 98%   Vitals:   01/15/19 0404 01/15/19 0604 01/15/19 0800 01/15/19 1200  BP: 139/81 (!) 146/87 138/82 138/79  Pulse: 67 (!) 59 (!) 57 61  Resp: 18 18 18 18   Temp: 98 F (36.7 C) 97.9 F (36.6 C) 98.5 F (36.9 C) 98.6 F (37 C)  TempSrc: Oral Oral Oral Oral  SpO2: 97% 99% 99% 98%  Weight:      Height:        General: Not in distress HEENT: Moist mucosa, supple neck Chest: Clear bilaterally CVS: Normal S1-S2 GI: Soft, nondistended, nontender Musculoskeletal: Warm, no edema, mild tenderness on pressure over the right wrist CNS: Alert and oriented, nonfocal   The results of significant diagnostics from this hospitalization (including imaging, microbiology, ancillary and laboratory) are listed below for reference.     Microbiology: No results found for this or any previous visit (from the past 240 hour(s)).   Labs: BNP (last 3 results) No results for input(s): BNP in the last 8760 hours. Basic Metabolic Panel: Recent Labs  Lab 01/14/19 2306  NA 138  K 3.4*  CL 107  CO2 24  GLUCOSE 120*  BUN 16  CREATININE 1.03  CALCIUM 8.5*   Liver Function Tests: Recent Labs  Lab 01/14/19 2306  AST 22  ALT 26  ALKPHOS 67  BILITOT 0.6  PROT 6.5  ALBUMIN 4.1   No results for input(s):  LIPASE, AMYLASE in the last 168 hours. No results for input(s): AMMONIA in the last 168 hours. CBC: Recent Labs  Lab 01/14/19  2306  WBC 10.4  NEUTROABS 6.5  HGB 15.1  HCT 43.7  MCV 95.4  PLT 288   Cardiac Enzymes: No results for input(s): CKTOTAL, CKMB, CKMBINDEX, TROPONINI in the last 168 hours. BNP: Invalid input(s): POCBNP CBG: No results for input(s): GLUCAP in the last 168 hours. D-Dimer No results for input(s): DDIMER in the last 72 hours. Hgb A1c Recent Labs    01/14/19 2306  HGBA1C 5.5   Lipid Profile Recent Labs    01/15/19 0548  CHOL 124  HDL 27*  LDLCALC 75  TRIG 111  CHOLHDL 4.6   Thyroid function studies No results for input(s): TSH, T4TOTAL, T3FREE, THYROIDAB in the last 72 hours.  Invalid input(s): FREET3 Anemia work up No results for input(s): VITAMINB12, FOLATE, FERRITIN, TIBC, IRON, RETICCTPCT in the last 72 hours. Urinalysis    Component Value Date/Time   COLORURINE YELLOW 01/15/2019 0006   APPEARANCEUR CLEAR 01/15/2019 0006   LABSPEC 1.014 01/15/2019 0006   PHURINE 6.0 01/15/2019 0006   GLUCOSEU NEGATIVE 01/15/2019 0006   HGBUR SMALL (A) 01/15/2019 0006   BILIRUBINUR NEGATIVE 01/15/2019 0006   KETONESUR NEGATIVE 01/15/2019 0006   PROTEINUR NEGATIVE 01/15/2019 0006   NITRITE NEGATIVE 01/15/2019 0006   LEUKOCYTESUR NEGATIVE 01/15/2019 0006   Sepsis Labs Invalid input(s): PROCALCITONIN,  WBC,  LACTICIDVEN Microbiology No results found for this or any previous visit (from the past 240 hour(s)).   Time coordinating discharge: < 30 minutes  SIGNED:   Louellen Molder, MD  [facility] 01/15/2019, 6:03 PM Pager   If 7PM-7AM, please contact night-coverage [email or web address] Password [email or web address]

## 2019-01-15 NOTE — Discharge Instructions (Signed)
Carpal Tunnel Syndrome    Carpal tunnel syndrome is a condition that causes pain in your hand and arm. The carpal tunnel is a narrow area that is on the palm side of your wrist. Repeated wrist motion or certain diseases may cause swelling in the tunnel. This swelling can pinch the main nerve in the wrist (median nerve).  What are the causes?  This condition may be caused by:   Repeated wrist motions.   Wrist injuries.   Arthritis.   A sac of fluid (cyst) or abnormal growth (tumor) in the carpal tunnel.   Fluid buildup during pregnancy.  Sometimes the cause is not known.  What increases the risk?  The following factors may make you more likely to develop this condition:   Having a job in which you move your wrist in the same way many times. This includes jobs like being a butcher or a cashier.   Being a woman.   Having other health conditions, such as:  ? Diabetes.  ? Obesity.  ? A thyroid gland that is not active enough (hypothyroidism).  ? Kidney failure.  What are the signs or symptoms?  Symptoms of this condition include:   A tingling feeling in your fingers.   Tingling or a loss of feeling (numbness) in your hand.   Pain in your entire arm. This pain may get worse when you bend your wrist and elbow for a long time.   Pain in your wrist that goes up your arm to your shoulder.   Pain that goes down into your palm or fingers.   A weak feeling in your hands. You may find it hard to grab and hold items.  You may feel worse at night.  How is this diagnosed?  This condition is diagnosed with a medical history and physical exam. You may also have tests, such as:   Electromyogram (EMG). This test checks the signals that the nerves send to the muscles.   Nerve conduction study. This test checks how well signals pass through your nerves.   Imaging tests, such as X-rays, ultrasound, and MRI. These tests check for what might be the cause of your condition.  How is this treated?  This condition may be treated  with:   Lifestyle changes. You will be asked to stop or change the activity that caused your problem.   Doing exercise and activities that make bones and muscles stronger (physical therapy).   Learning how to use your hand again (occupational therapy).   Medicines for pain and swelling (inflammation). You may have injections in your wrist.   A wrist splint.   Surgery.  Follow these instructions at home:  If you have a splint:   Wear the splint as told by your doctor. Remove it only as told by your doctor.   Loosen the splint if your fingers:  ? Tingle.  ? Lose feeling (become numb).  ? Turn cold and blue.   Keep the splint clean.   If the splint is not waterproof:  ? Do not let it get wet.  ? Cover it with a watertight covering when you take a bath or a shower.  Managing pain, stiffness, and swelling     If told, put ice on the painful area:  ? If you have a removable splint, remove it as told by your doctor.  ? Put ice in a plastic bag.  ? Place a towel between your skin and the bag.  ? Leave the   ice on for 20 minutes, 2-3 times per day.  General instructions   Take over-the-counter and prescription medicines only as told by your doctor.   Rest your wrist from any activity that may cause pain. If needed, talk with your boss at work about changes that can help your wrist heal.   Do any exercises as told by your doctor, physical therapist, or occupational therapist.   Keep all follow-up visits as told by your doctor. This is important.  Contact a doctor if:   You have new symptoms.   Medicine does not help your pain.   Your symptoms get worse.  Get help right away if:   You have very bad numbness or tingling in your wrist or hand.  Summary   Carpal tunnel syndrome is a condition that causes pain in your hand and arm.   It is often caused by repeated wrist motions.   Lifestyle changes and medicines are used to treat this problem. Surgery may help in very bad cases.   Follow your doctor's  instructions about wearing a splint, resting your wrist, keeping follow-up visits, and calling for help.  This information is not intended to replace advice given to you by your health care provider. Make sure you discuss any questions you have with your health care provider.  Document Released: 11/14/2011 Document Revised: 04/03/2018 Document Reviewed: 04/03/2018  Elsevier Interactive Patient Education  2019 [employer]

## 2019-01-15 NOTE — Progress Notes (Signed)
SLP Cancellation Note  Patient Details Name: Jason Walsh MRN: [id number] DOB: [birth year]   Cancelled treatment:       Reason Eval/Treat Not Completed: SLP screened, no needs identified, will sign off. No changes in cognitive linguistic or speech. Thank you for this referral,  Amelia H. Roddie Mc, CCC-SLP Speech Language Pathologist    Wende Bushy 01/15/2019, 7:54 AM

## 2019-01-15 NOTE — Care Management (Signed)
[facility] appt is Feburary 11th at 3 pm.  Patient has been contacted by [facility] staff.
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
## 2019-01-16 LAB — HIV ANTIBODY (ROUTINE TESTING W REFLEX): HIV Screen 4th Generation wRfx: NONREACTIVE

## 2019-06-14 ENCOUNTER — Encounter (HOSPITAL_COMMUNITY): Payer: Self-pay | Admitting: Emergency Medicine

## 2019-06-14 ENCOUNTER — Emergency Department (HOSPITAL_COMMUNITY): Payer: No Typology Code available for payment source

## 2019-06-14 ENCOUNTER — Emergency Department (HOSPITAL_COMMUNITY)
Admission: EM | Admit: 2019-06-14 | Discharge: 2019-06-14 | Disposition: A | Discharge status: Home / Self Care | Payer: No Typology Code available for payment source | Attending: Emergency Medicine | Admitting: Emergency Medicine

## 2019-06-14 ENCOUNTER — Other Ambulatory Visit: Payer: Self-pay

## 2019-06-14 DIAGNOSIS — Y9389 Activity, other specified: Secondary | ICD-10-CM | POA: Diagnosis not present

## 2019-06-14 DIAGNOSIS — Z8673 Personal history of transient ischemic attack (TIA), and cerebral infarction without residual deficits: Secondary | ICD-10-CM | POA: Insufficient documentation

## 2019-06-14 DIAGNOSIS — Y999 Unspecified external cause status: Secondary | ICD-10-CM | POA: Insufficient documentation

## 2019-06-14 DIAGNOSIS — Y92410 Unspecified street and highway as the place of occurrence of the external cause: Secondary | ICD-10-CM | POA: Diagnosis not present

## 2019-06-14 DIAGNOSIS — Z886 Allergy status to analgesic agent status: Secondary | ICD-10-CM | POA: Insufficient documentation

## 2019-06-14 DIAGNOSIS — Z85828 Personal history of other malignant neoplasm of skin: Secondary | ICD-10-CM | POA: Diagnosis not present

## 2019-06-14 DIAGNOSIS — S060X0A Concussion without loss of consciousness, initial encounter: Secondary | ICD-10-CM | POA: Diagnosis not present

## 2019-06-14 DIAGNOSIS — F17210 Nicotine dependence, cigarettes, uncomplicated: Secondary | ICD-10-CM | POA: Insufficient documentation

## 2019-06-14 DIAGNOSIS — S0990XA Unspecified injury of head, initial encounter: Secondary | ICD-10-CM | POA: Diagnosis present

## 2019-06-14 DIAGNOSIS — M542 Cervicalgia: Secondary | ICD-10-CM | POA: Insufficient documentation

## 2019-06-14 DIAGNOSIS — M25512 Pain in left shoulder: Secondary | ICD-10-CM | POA: Diagnosis not present

## 2019-06-14 DIAGNOSIS — M7918 Myalgia, other site: Secondary | ICD-10-CM

## 2019-06-14 MED ORDER — METHOCARBAMOL 500 MG PO TABS: 500.0000 mg | ORAL_TABLET | Freq: Two times a day (BID) | ORAL | 0 refills | Status: AC

## 2019-06-14 MED ORDER — METHOCARBAMOL 500 MG PO TABS
500.0000 mg | ORAL_TABLET | Freq: Once | ORAL | Status: AC
  Administered 2019-06-14: 500 mg via ORAL
  Filled 2019-06-14: qty 1
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
## 2019-07-01 ENCOUNTER — Other Ambulatory Visit (HOSPITAL_COMMUNITY)
Admission: RE | Admit: 2019-07-01 | Discharge: 2019-07-01 | Disposition: A | Discharge status: Home / Self Care | Payer: Self-pay | Source: Ambulatory Visit | Attending: Physician Assistant | Admitting: Physician Assistant

## 2019-07-01 ENCOUNTER — Encounter: Payer: Self-pay | Admitting: Physician Assistant

## 2019-07-01 ENCOUNTER — Ambulatory Visit: Payer: Self-pay | Admitting: Physician Assistant

## 2019-07-01 VITALS — BP 146/80 | HR 62 | Temp 98.1°F | Wt 180.3 lb

## 2019-07-01 DIAGNOSIS — Z7689 Persons encountering health services in other specified circumstances: Secondary | ICD-10-CM

## 2019-07-01 DIAGNOSIS — Z125 Encounter for screening for malignant neoplasm of prostate: Secondary | ICD-10-CM

## 2019-07-01 DIAGNOSIS — C44300 Unspecified malignant neoplasm of skin of unspecified part of face: Secondary | ICD-10-CM

## 2019-07-01 DIAGNOSIS — S161XXD Strain of muscle, fascia and tendon at neck level, subsequent encounter: Secondary | ICD-10-CM

## 2019-07-01 DIAGNOSIS — I10 Essential (primary) hypertension: Secondary | ICD-10-CM

## 2019-07-01 DIAGNOSIS — F17200 Nicotine dependence, unspecified, uncomplicated: Secondary | ICD-10-CM

## 2019-07-01 LAB — PSA: Prostatic Specific Antigen: 1.47 ng/mL (ref 0.00–4.00)

## 2019-07-01 MED ORDER — CYCLOBENZAPRINE HCL 10 MG PO TABS: 10.0000 mg | ORAL_TABLET | Freq: Three times a day (TID) | ORAL | 0 refills | Status: DC | PRN

## 2019-07-01 MED ORDER — LISINOPRIL 10 MG PO TABS: 10.0000 mg | ORAL_TABLET | Freq: Every day | ORAL | 1 refills | Status: DC

## 2019-07-01 NOTE — Patient Instructions (Signed)
Cervical Sprain  A cervical sprain is a stretch or tear in the tissues that connect bones (ligaments) in the neck. Most neck (cervical) sprains get better in 4-6 weeks. Follow these instructions at home:  Managing pain, stiffness, and swelling   Use a cervical traction device, if told by your doctor.  If told, put heat on the affected area. Do this before exercises (physical therapy) or as often as told by your doctor. Use the heat source that your doctor recommends, such as a moist heat pack or a heating pad. ? Place a towel between your skin and the heat source. ? Leave the heat on for 20-30 minutes. ? Take the heat off (remove the heat) if your skin turns bright red. This is very important if you cannot feel pain, heat, or cold. You may have a greater risk of getting burned.  Put ice on the affected area. ? Put ice in a plastic bag. ? Place a towel between your skin and the bag. ? Leave the ice on for 20 minutes, 2-3 times a day. Activity  Do not drive while wearing a neck collar. If you do not have a neck collar, ask your doctor if it is safe to drive.  Do not drive or use heavy machinery while taking prescription pain medicine or muscle relaxants, unless your doctor approves.  Do not lift anything that is heavier than 10 lb (4.5 kg) until your doctor tells you that it is safe.  Rest as told by your doctor.  Avoid activities that make you feel worse. Ask your doctor what activities are safe for you.  Do exercises as told by your doctor or physical therapist. Preventing neck sprain  Practice good posture. Adjust your workstation to help with this, if needed.  Exercise regularly as told by your doctor or physical therapist.  Avoid activities that are risky or may cause a neck sprain (cervical sprain). General instructions  Take over-the-counter and prescription medicines only as told by your doctor.  Do not use any products that contain nicotine or tobacco. This includes  cigarettes and e-cigarettes. If you need help quitting, ask your doctor.  Keep all follow-up visits as told by your doctor. This is important. Contact a doctor if:  You have pain or other symptoms that get worse.  You have symptoms that do not get better after 2 weeks.  You have pain that does not get better with medicine.  You start to have new, unexplained symptoms.  You have sores or irritated skin from wearing your neck collar. Get help right away if:  You have very bad pain.  You have any of the following in any part of your body: ? Loss of feeling (numbness). ? Tingling. ? Weakness.  You cannot move a part of your body (you have paralysis).  Your activity level does not improve. Summary  A cervical sprain is a stretch or tear in the tissues that connect bones (ligaments) in the neck.  If you have a neck (cervical) collar, do not take off the collar unless your doctor says that this is safe.  Put ice on affected areas as told by your doctor.  Put heat on affected areas as told by your doctor.  Good posture and regular exercise can help prevent a neck sprain from happening again. This information is not intended to replace advice given to you by your health care provider. Make sure you discuss any questions you have with your health care provider. Document Released:  05/13/2008 Document Revised: 03/17/2019 Document Reviewed: 08/06/2016 Elsevier Patient Education  [place].
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
## 2019-08-04 ENCOUNTER — Ambulatory Visit: Payer: Self-pay | Admitting: Physician Assistant

## 2019-08-04 ENCOUNTER — Telehealth: Payer: Self-pay | Admitting: Orthopedic Surgery

## 2019-08-04 ENCOUNTER — Encounter: Payer: Self-pay | Admitting: Physician Assistant

## 2019-08-04 DIAGNOSIS — F1290 Cannabis use, unspecified, uncomplicated: Secondary | ICD-10-CM

## 2019-08-04 DIAGNOSIS — M542 Cervicalgia: Secondary | ICD-10-CM

## 2019-08-04 DIAGNOSIS — I10 Essential (primary) hypertension: Secondary | ICD-10-CM

## 2019-08-04 DIAGNOSIS — F17200 Nicotine dependence, unspecified, uncomplicated: Secondary | ICD-10-CM

## 2019-08-04 DIAGNOSIS — C44300 Unspecified malignant neoplasm of skin of unspecified part of face: Secondary | ICD-10-CM

## 2019-08-04 MED ORDER — AMLODIPINE BESYLATE 5 MG PO TABS: 5.0000 mg | ORAL_TABLET | Freq: Every day | ORAL | 1 refills | Status: DC
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
## 2019-08-11 ENCOUNTER — Emergency Department (HOSPITAL_COMMUNITY)
Admission: EM | Admit: 2019-08-11 | Discharge: 2019-08-11 | Disposition: A | Discharge status: Home / Self Care | Payer: No Typology Code available for payment source | Attending: Emergency Medicine | Admitting: Emergency Medicine

## 2019-08-11 ENCOUNTER — Other Ambulatory Visit: Payer: Self-pay

## 2019-08-11 DIAGNOSIS — F17210 Nicotine dependence, cigarettes, uncomplicated: Secondary | ICD-10-CM | POA: Diagnosis not present

## 2019-08-11 DIAGNOSIS — Z85828 Personal history of other malignant neoplasm of skin: Secondary | ICD-10-CM | POA: Insufficient documentation

## 2019-08-11 DIAGNOSIS — M5441 Lumbago with sciatica, right side: Secondary | ICD-10-CM | POA: Diagnosis not present

## 2019-08-11 DIAGNOSIS — M545 Low back pain: Secondary | ICD-10-CM | POA: Diagnosis present

## 2019-08-11 MED ORDER — PREDNISONE 10 MG (21) PO TBPK: ORAL_TABLET | Freq: Every day | ORAL | 0 refills | Status: DC

## 2019-08-11 MED ORDER — OXYCODONE-ACETAMINOPHEN 5-325 MG PO TABS
1.0000 | ORAL_TABLET | Freq: Once | ORAL | Status: AC
  Administered 2019-08-11: 22:00:00 1 via ORAL
  Filled 2019-08-11: qty 1

## 2019-08-11 MED ORDER — CYCLOBENZAPRINE HCL 10 MG PO TABS: 10.0000 mg | ORAL_TABLET | Freq: Three times a day (TID) | ORAL | 0 refills | Status: DC | PRN

## 2019-08-11 MED ORDER — CYCLOBENZAPRINE HCL 10 MG PO TABS
10.0000 mg | ORAL_TABLET | Freq: Once | ORAL | Status: AC
  Administered 2019-08-11: 10 mg via ORAL
  Filled 2019-08-11: qty 1
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
## 2019-08-11 NOTE — Discharge Instructions (Addendum)
Alternate ice and heat to your back.  Avoid twisting or bending movements.  No heavy lifting.  Take the medications as directed.  I have provided referral information for the neurosurgery group in [place].  You may contact the office to arrange follow-up appointment.

## 2019-08-23 ENCOUNTER — Other Ambulatory Visit: Payer: Self-pay | Admitting: Physician Assistant

## 2019-08-23 DIAGNOSIS — I10 Essential (primary) hypertension: Secondary | ICD-10-CM

## 2019-08-25 ENCOUNTER — Other Ambulatory Visit (HOSPITAL_COMMUNITY)
Admission: RE | Admit: 2019-08-25 | Discharge: 2019-08-25 | Disposition: A | Discharge status: Home / Self Care | Payer: Self-pay | Source: Ambulatory Visit | Attending: Physician Assistant | Admitting: Physician Assistant

## 2019-08-25 DIAGNOSIS — I10 Essential (primary) hypertension: Secondary | ICD-10-CM | POA: Insufficient documentation

## 2019-08-25 LAB — BASIC METABOLIC PANEL
Anion gap: 7 (ref 5–15)
BUN: 21 mg/dL — ABNORMAL HIGH (ref 6–20)
CO2: 26 mmol/L (ref 22–32)
Calcium: 8.8 mg/dL — ABNORMAL LOW (ref 8.9–10.3)
Chloride: 106 mmol/L (ref 98–111)
Creatinine, Ser: 1.38 mg/dL — ABNORMAL HIGH (ref 0.61–1.24)
GFR calc Af Amer: >60 mL/min (ref >60)
GFR calc non Af Amer: 56 mL/min — ABNORMAL LOW (ref >60)
Glucose, Bld: 126 mg/dL — ABNORMAL HIGH (ref 70–99)
Potassium: 3.6 mmol/L (ref 3.5–5.1)
Sodium: 139 mmol/L (ref 135–145)

## 2019-08-31 ENCOUNTER — Other Ambulatory Visit: Payer: Self-pay

## 2019-08-31 ENCOUNTER — Ambulatory Visit: Payer: Self-pay | Admitting: Physician Assistant

## 2019-08-31 ENCOUNTER — Encounter: Payer: Self-pay | Admitting: Physician Assistant

## 2019-08-31 VITALS — BP 156/82 | HR 62 | Temp 98.4°F

## 2019-08-31 DIAGNOSIS — R7989 Other specified abnormal findings of blood chemistry: Secondary | ICD-10-CM

## 2019-08-31 DIAGNOSIS — C44300 Unspecified malignant neoplasm of skin of unspecified part of face: Secondary | ICD-10-CM

## 2019-08-31 DIAGNOSIS — F17200 Nicotine dependence, unspecified, uncomplicated: Secondary | ICD-10-CM

## 2019-08-31 DIAGNOSIS — I10 Essential (primary) hypertension: Secondary | ICD-10-CM

## 2019-08-31 DIAGNOSIS — Z2821 Immunization not carried out because of patient refusal: Secondary | ICD-10-CM

## 2019-08-31 MED ORDER — ALBUTEROL SULFATE HFA 108 (90 BASE) MCG/ACT IN AERS: 2.0000 | INHALATION_SPRAY | Freq: Four times a day (QID) | RESPIRATORY_TRACT | 1 refills | Status: DC | PRN

## 2019-08-31 MED ORDER — AMLODIPINE BESYLATE 5 MG PO TABS: 5.0000 mg | ORAL_TABLET | Freq: Every day | ORAL | 1 refills | Status: DC

## 2019-08-31 NOTE — Progress Notes (Signed)
BP (!) 156/82   Pulse 62   Temp 98.4 F (36.9 C)   SpO2 98%    Subjective:    Patient ID: Jason Walsh, male    DOB: 01-Mar-1961, 58 y.o.   MRN: [id number]  HPI: Jason Walsh is a 58 y.o. male presenting on 08/31/2019 for No chief complaint on file.   HPI  Pt had a negative covid 19 screening questionnaire  Pt is not usually wearing a mask.  He says it makes him feel like he can't breathe.  He is still smoking.  He didn't take his bp medication yesterday so his last dose was on Sunday sept 20.    At last appointment, pt was referred to dermatology.  He says he hasn't heard anything on that.   Relevant past medical, surgical, family and social history reviewed and updated as indicated. Interim medical history since our last visit reviewed. Allergies and medications reviewed and updated.   Current Outpatient Medications:  .  amLODipine (NORVASC) 5 MG tablet, Take 1 tablet (5 mg total) by mouth daily., Disp: 30 tablet, Rfl: 1    Review of Systems  Per HPI unless specifically indicated above     Objective:    BP (!) 156/82   Pulse 62   Temp 98.4 F (36.9 C)   SpO2 98%   Wt Readings from Last 3 Encounters:  08/11/19 185 lb (83.9 kg)  07/01/19 180 lb 4.8 oz (81.8 kg)  06/14/19 180 lb (81.6 kg)    Physical Exam Vitals signs reviewed.  Constitutional:      General: He is not in acute distress.    Appearance: He is well-developed. He is not ill-appearing.     Comments: Strong smell of smoke on the pt  HENT:     Head: Normocephalic and atraumatic.      Comments: Open sore approx 1cm diameter.  No redness, purulence, sign of infection.  Neck:     Musculoskeletal: Neck supple.  Cardiovascular:     Rate and Rhythm: Normal rate and regular rhythm.  Pulmonary:     Effort: Pulmonary effort is normal.     Breath sounds: Normal breath sounds. No wheezing.  Abdominal:     General: Bowel sounds are normal.     Palpations: Abdomen is soft.     Tenderness: There is no  abdominal tenderness.  Musculoskeletal:     Right lower leg: No edema.     Left lower leg: No edema.  Lymphadenopathy:     Cervical: No cervical adenopathy.  Skin:    General: Skin is warm and dry.  Neurological:     Mental Status: He is alert and oriented to person, place, and time.  Psychiatric:        Attention and Perception: Attention normal.        Mood and Affect: Affect is angry.        Speech: Speech normal.     Results for orders placed or performed during the hospital encounter of [id number]  Basic metabolic panel  Result Value Ref Range   Sodium 139 135 - 145 mmol/L   Potassium 3.6 3.5 - 5.1 mmol/L   Chloride 106 98 - 111 mmol/L   CO2 26 22 - 32 mmol/L   Glucose, Bld 126 (H) 70 - 99 mg/dL   BUN 21 (H) 6 - 20 mg/dL   Creatinine, Ser 1.38 (H) 0.61 - 1.24 mg/dL   Calcium 8.8 (L) 8.9 - 10.3 mg/dL   GFR  calc non Af Amer 56 (L) >60 mL/min   GFR calc Af Amer >60 >60 mL/min   Anion gap 7 5 - 15      Assessment & Plan:    Encounter Diagnoses  Name Primary?  . Essential hypertension Yes  . Tobacco use disorder   . Skin cancer of face   . Elevated serum creatinine   . Influenza vaccination declined      -reviewed labs with pt -recommended Smoking cessation -recommended pt increase water intake -will rx inhaler to use prn -Will check on referral to dermatology- takes longer as referral made to tertiary care center -cousenled pt to avoid missing doses of bp medication -pt declined Flu vaccination -pt to follow up 3 months.  He is to contact office sooner prn

## 2019-11-18 ENCOUNTER — Other Ambulatory Visit (HOSPITAL_COMMUNITY)
Admission: RE | Admit: 2019-11-18 | Discharge: 2019-11-18 | Disposition: A | Discharge status: Home / Self Care | Payer: Self-pay | Source: Ambulatory Visit | Attending: Physician Assistant | Admitting: Physician Assistant

## 2019-11-18 DIAGNOSIS — I10 Essential (primary) hypertension: Secondary | ICD-10-CM

## 2019-11-18 DIAGNOSIS — R7989 Other specified abnormal findings of blood chemistry: Secondary | ICD-10-CM

## 2019-11-18 DIAGNOSIS — R69 Illness, unspecified: Secondary | ICD-10-CM | POA: Insufficient documentation

## 2019-11-18 LAB — BASIC METABOLIC PANEL
Anion gap: 12 (ref 5–15)
BUN: 18 mg/dL (ref 6–20)
CO2: 25 mmol/L (ref 22–32)
Calcium: 9.2 mg/dL (ref 8.9–10.3)
Chloride: 102 mmol/L (ref 98–111)
Creatinine, Ser: 1.43 mg/dL — ABNORMAL HIGH (ref 0.61–1.24)
GFR calc Af Amer: >60 mL/min (ref >60)
GFR calc non Af Amer: 53 mL/min — ABNORMAL LOW (ref >60)
Glucose, Bld: 137 mg/dL — ABNORMAL HIGH (ref 70–99)
Potassium: 3.7 mmol/L (ref 3.5–5.1)
Sodium: 139 mmol/L (ref 135–145)

## 2019-11-23 ENCOUNTER — Encounter: Payer: Self-pay | Admitting: Physician Assistant

## 2019-11-23 ENCOUNTER — Ambulatory Visit: Payer: Self-pay | Admitting: Physician Assistant

## 2019-11-23 VITALS — BP 160/100

## 2019-11-23 DIAGNOSIS — I10 Essential (primary) hypertension: Secondary | ICD-10-CM

## 2019-11-23 DIAGNOSIS — N189 Chronic kidney disease, unspecified: Secondary | ICD-10-CM

## 2019-11-23 DIAGNOSIS — F17200 Nicotine dependence, unspecified, uncomplicated: Secondary | ICD-10-CM

## 2019-11-23 DIAGNOSIS — C44300 Unspecified malignant neoplasm of skin of unspecified part of face: Secondary | ICD-10-CM

## 2019-11-23 MED ORDER — AMLODIPINE BESYLATE 10 MG PO TABS: 10.0000 mg | ORAL_TABLET | Freq: Every day | ORAL | 4 refills | Status: DC

## 2019-11-23 NOTE — Progress Notes (Signed)
BP (!) 160/100    Subjective:    Patient ID: Jason Walsh, male    DOB: [birth year], 58 y.o.   MRN: [id number]  HPI: Jason Walsh is a 58 y.o. male presenting on 11/23/2019 for No chief complaint on file.   HPI   This is a telemedicine appointment through [employer] due to coronavirus pandemic.  I connected with  Jason Walsh on 11/23/19 by a video enabled telemedicine application and verified that I am speaking with the correct person using two identifiers.   I discussed the limitations of evaluation and management by telemedicine. The patient expressed understanding and agreed to proceed.  Patient is at home.  Provider is at office.  Patient is a 58 year old male with appointment to follow-up on hypertension and abnormal skin lesions.  Patient was seen by dermatology at [facility] in November and had multiple biopsies.  All biopsies revealed basal cell carcinoma.  Recommendations were made for surgery.  Patient has not yet been scheduled for this.  He says he needs to finish financial application at that institution.  Patient was given an automatic blood pressure machine by this facility.  Blood pressure noted above.  bp 155/78 at dermatology appointment on 10/19/19  Patient is having no sob-he only uses [facility] occasionally.  He continues to smoke.  He says he does not wear a mask when he goes out in public because it bothers his breathing.  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      Relevant past medical, surgical, family and social history reviewed and updated as indicated. Interim medical history since our last visit reviewed. Allergies and medications reviewed and updated.   Current Outpatient Medications:  .  albuterol (VENTOLIN HFA) 108 (90 Base) MCG/ACT inhaler, Inhale 2 puffs into the lungs every 6 (six) hours as needed for wheezing Walsh shortness of breath., Disp: 6 g, Rfl: 1 .  amLODipine (NORVASC) 5 MG tablet, Take 1 tablet (5 mg total) by mouth  daily., Disp: 30 tablet, Rfl: 1    Review of Systems  Per HPI unless specifically indicated above     Objective:    BP (!) 160/100   Wt Readings from Last 3 Encounters:  08/11/19 185 lb (83.9 kg)  07/01/19 180 lb 4.8 oz (81.8 kg)  06/14/19 180 lb (81.6 kg)    Physical Exam Constitutional:      General: He is not in acute distress. HENT:     Head: Normocephalic and atraumatic.  Pulmonary:     Effort: Pulmonary effort is normal. No respiratory distress.  Neurological:     Mental Status: He is alert and oriented to person, place, and time.  Psychiatric:        Attention and Perception: Attention normal.        Speech: Speech normal.        Behavior: Behavior is cooperative.     Results for orders placed Walsh performed during the hospital encounter of [id number]  Basic metabolic panel  Result Value Ref Range   Sodium 139 135 - 145 mmol/L   Potassium 3.7 3.5 - 5.1 mmol/L   Chloride 102 98 - 111 mmol/L   CO2 25 22 - 32 mmol/L   Glucose, Bld 137 (H) 70 - 99 mg/dL   BUN 18 6 - 20 mg/dL   Creatinine, Ser 1.43 (H) 0.61 - 1.24 mg/dL   Calcium 9.2 8.9 - 10.3 mg/dL   GFR calc non Af Amer 53 (L) >60 mL/min  GFR calc Af Amer >60 >60 mL/min   Anion gap 12 5 - 15      Assessment & Plan:     1.  Hypertension Reviewed labs with patient.  Will increase amlodipine to 10mg .  Patient encouraged to monitor blood pressure.  Patient will follow up with telemedicine appointment in 1 month to recheck blood pressure.  2.  CKD We will continue to monitor kidney function and will refer to nephrology if cr worsens  3.  Basal cell carcinoma Patient is encouraged to finish out financial applications at [facility] and schedule follow-up appointment with dermatology to get treatment for his basal cell carcinoma.  4.  Tobacco use disorder Encouraged smoking cessation.  Patient has inhaler to use as needed   Patient to follow-up in 1 month as stated above.   He is to contact office sooner if needed.  Patient is encouraged to wear a mask when he goes out in public to reduce risk of COVID-19 transmission.

## 2019-12-22 ENCOUNTER — Ambulatory Visit: Payer: Self-pay | Admitting: Physician Assistant

## 2019-12-22 ENCOUNTER — Encounter: Payer: Self-pay | Admitting: Physician Assistant

## 2019-12-22 VITALS — BP 176/112 | HR 69

## 2019-12-22 DIAGNOSIS — N189 Chronic kidney disease, unspecified: Secondary | ICD-10-CM

## 2019-12-22 DIAGNOSIS — I10 Essential (primary) hypertension: Secondary | ICD-10-CM

## 2019-12-22 DIAGNOSIS — C44300 Unspecified malignant neoplasm of skin of unspecified part of face: Secondary | ICD-10-CM

## 2019-12-22 DIAGNOSIS — F17200 Nicotine dependence, unspecified, uncomplicated: Secondary | ICD-10-CM

## 2019-12-22 MED ORDER — LOSARTAN POTASSIUM 100 MG PO TABS: 100.0000 mg | ORAL_TABLET | Freq: Every day | ORAL | 1 refills | Status: DC

## 2019-12-22 NOTE — Progress Notes (Signed)
   BP (!) 176/112   Pulse 69    Subjective:    Patient ID: Jason Walsh, male    DOB: [birth year], 59 y.o.   MRN: [id number]  HPI: Jason Walsh is a 59 y.o. male presenting on 12/22/2019 for No chief complaint on file.   HPI    This is a telemedicine appointment through [employer] due to coronavirus pandemic.  I connected with  Jason Walsh on 12/22/19 by a video enabled telemedicine application and verified that I am speaking with the correct person using two identifiers.   I discussed the limitations of evaluation and management by telemedicine. The patient expressed understanding and agreed to proceed.  Pt is at home.  Provider is at office.    Pt is 59yo M with htn.  Pt stopped his amlodipine because he says it was making him sleepy and depressed.    Pt was given BP machine and reading is noted.   He is still trying to work on getting to dermatology for removal of multiple basal cell cancers (biopsied at [facility]) .     Relevant past medical, surgical, family and social history reviewed and updated as indicated. Interim medical history since our last visit reviewed. Allergies and medications reviewed and updated.   Current Outpatient Medications:  .  albuterol (VENTOLIN HFA) 108 (90 Base) MCG/ACT inhaler, Inhale 2 puffs into the lungs every 6 (six) hours as needed for wheezing or shortness of breath., Disp: 6 g, Rfl: 1 .  amLODipine (NORVASC) 10 MG tablet, Take 1 tablet (10 mg total) by mouth daily. (Patient not taking: Reported on 12/22/2019), Disp: 30 tablet, Rfl: 4     Review of Systems  Per HPI unless specifically indicated above     Objective:    BP (!) 176/112   Pulse 69   Wt Readings from Last 3 Encounters:  08/11/19 185 lb (83.9 kg)  07/01/19 180 lb 4.8 oz (81.8 kg)  06/14/19 180 lb (81.6 kg)    Physical Exam Constitutional:      General: He is not in acute distress.    Appearance: He is not toxic-appearing.  HENT:     Head: Normocephalic and  atraumatic.  Pulmonary:     Effort: Pulmonary effort is normal. No respiratory distress.  Neurological:     Mental Status: He is alert and oriented to person, place, and time.  Psychiatric:        Attention and Perception: Attention normal.        Speech: Speech normal.        Behavior: Behavior is cooperative.           Assessment & Plan:    Encounter Diagnoses  Name Primary?  . Essential hypertension Yes  . Tobacco use disorder   . Skin cancer of face   . Chronic kidney disease, unspecified CKD stage      -rx losartan.  Pt is counseled to Notify office if he has rx problems as it can be dangerous to stop bp medication without something else to control the BP.  He states understanding. -pt to continue working with derm to get appointment for MOHS surgery -will recheck renal function prior to follow up appointment.  Will diescuss referrral to nephrology if not improved -pt to follow up 1 month with virtual appointment to check bp.  Pt to contact office sooner prn

## 2020-01-18 ENCOUNTER — Ambulatory Visit: Payer: Self-pay | Admitting: Physician Assistant

## 2020-01-21 ENCOUNTER — Other Ambulatory Visit (HOSPITAL_COMMUNITY)
Admission: RE | Admit: 2020-01-21 | Discharge: 2020-01-21 | Disposition: A | Discharge status: Home / Self Care | Payer: Self-pay | Source: Ambulatory Visit | Attending: Physician Assistant | Admitting: Physician Assistant

## 2020-01-21 ENCOUNTER — Other Ambulatory Visit: Payer: Self-pay

## 2020-01-21 DIAGNOSIS — N189 Chronic kidney disease, unspecified: Secondary | ICD-10-CM | POA: Insufficient documentation

## 2020-01-21 DIAGNOSIS — I10 Essential (primary) hypertension: Secondary | ICD-10-CM | POA: Insufficient documentation

## 2020-01-21 LAB — RENAL FUNCTION PANEL
Albumin: 3.9 g/dL (ref 3.5–5.0)
Anion gap: 8 (ref 5–15)
BUN: 14 mg/dL (ref 6–20)
CO2: 24 mmol/L (ref 22–32)
Calcium: 8.6 mg/dL — ABNORMAL LOW (ref 8.9–10.3)
Chloride: 103 mmol/L (ref 98–111)
Creatinine, Ser: 1.09 mg/dL (ref 0.61–1.24)
GFR calc Af Amer: >60 mL/min (ref >60)
GFR calc non Af Amer: >60 mL/min (ref >60)
Glucose, Bld: 190 mg/dL — ABNORMAL HIGH (ref 70–99)
Phosphorus: 2.2 mg/dL — ABNORMAL LOW (ref 2.5–4.6)
Potassium: 3.8 mmol/L (ref 3.5–5.1)
Sodium: 135 mmol/L (ref 135–145)

## 2020-01-25 ENCOUNTER — Ambulatory Visit: Payer: Self-pay | Admitting: Physician Assistant

## 2020-05-11 ENCOUNTER — Encounter: Payer: Self-pay | Admitting: Physician Assistant

## 2022-03-12 ENCOUNTER — Other Ambulatory Visit: Payer: Self-pay

## 2022-03-12 ENCOUNTER — Emergency Department (HOSPITAL_COMMUNITY): Payer: Self-pay

## 2022-03-12 ENCOUNTER — Encounter (HOSPITAL_COMMUNITY): Payer: Self-pay

## 2022-03-12 ENCOUNTER — Emergency Department (HOSPITAL_COMMUNITY)
Admission: EM | Admit: 2022-03-12 | Discharge: 2022-03-12 | Discharge status: Against Medical Advice | Payer: Self-pay | Attending: Emergency Medicine | Admitting: Emergency Medicine

## 2022-03-12 DIAGNOSIS — X58XXXA Exposure to other specified factors, initial encounter: Secondary | ICD-10-CM | POA: Insufficient documentation

## 2022-03-12 DIAGNOSIS — S61011A Laceration without foreign body of right thumb without damage to nail, initial encounter: Secondary | ICD-10-CM | POA: Insufficient documentation

## 2022-03-12 DIAGNOSIS — Z5321 Procedure and treatment not carried out due to patient leaving prior to being seen by health care provider: Secondary | ICD-10-CM | POA: Insufficient documentation

## 2022-03-12 NOTE — ED Triage Notes (Addendum)
Presents with right thumb laceration from using table saw around 3 today. Bleeding controlled. Doesn't know when last tetanus shot was.  ? ?Pt seems to be under the influence of something ?

## 2022-03-12 NOTE — ED Notes (Signed)
Pt name called by xray with no response. Xray tech told this RN and will assess at later time to see if patient appears back in waiting area before discharging out. ?

## 2022-03-12 NOTE — ED Notes (Signed)
Pt called again with no response ?

## 2022-09-05 ENCOUNTER — Emergency Department (HOSPITAL_COMMUNITY)
Admission: EM | Admit: 2022-09-05 | Discharge: 2022-09-05 | Discharge status: Against Medical Advice | Payer: No Typology Code available for payment source

## 2022-09-17 ENCOUNTER — Encounter: Payer: Self-pay | Admitting: Family Medicine

## 2022-09-17 ENCOUNTER — Ambulatory Visit (INDEPENDENT_AMBULATORY_CARE_PROVIDER_SITE_OTHER): Payer: 59 | Admitting: Family Medicine

## 2022-09-17 ENCOUNTER — Ambulatory Visit (HOSPITAL_COMMUNITY)
Admission: RE | Admit: 2022-09-17 | Discharge: 2022-09-17 | Disposition: A | Discharge status: Home / Self Care | Payer: 59 | Source: Ambulatory Visit | Attending: Family Medicine | Admitting: Family Medicine

## 2022-09-17 DIAGNOSIS — I10 Essential (primary) hypertension: Secondary | ICD-10-CM | POA: Insufficient documentation

## 2022-09-17 DIAGNOSIS — Z8673 Personal history of transient ischemic attack (TIA), and cerebral infarction without residual deficits: Secondary | ICD-10-CM | POA: Insufficient documentation

## 2022-09-17 DIAGNOSIS — G8929 Other chronic pain: Secondary | ICD-10-CM | POA: Insufficient documentation

## 2022-09-17 DIAGNOSIS — Z130 Encounter for screening for diseases of the blood and blood-forming organs and certain disorders involving the immune mechanism: Secondary | ICD-10-CM | POA: Diagnosis not present

## 2022-09-17 DIAGNOSIS — M25551 Pain in right hip: Secondary | ICD-10-CM | POA: Diagnosis present

## 2022-09-17 DIAGNOSIS — M5441 Lumbago with sciatica, right side: Secondary | ICD-10-CM | POA: Insufficient documentation

## 2022-09-17 DIAGNOSIS — R739 Hyperglycemia, unspecified: Secondary | ICD-10-CM

## 2022-09-17 DIAGNOSIS — Z125 Encounter for screening for malignant neoplasm of prostate: Secondary | ICD-10-CM

## 2022-09-17 DIAGNOSIS — Z87898 Personal history of other specified conditions: Secondary | ICD-10-CM | POA: Insufficient documentation

## 2022-09-17 DIAGNOSIS — C44300 Unspecified malignant neoplasm of skin of unspecified part of face: Secondary | ICD-10-CM | POA: Insufficient documentation

## 2022-09-17 DIAGNOSIS — E785 Hyperlipidemia, unspecified: Secondary | ICD-10-CM

## 2022-09-17 MED ORDER — TRAMADOL HCL 50 MG PO TABS: 50.0000 mg | ORAL_TABLET | Freq: Three times a day (TID) | ORAL | 0 refills | Status: DC | PRN

## 2022-09-17 MED ORDER — ASPIRIN 81 MG PO TBEC: 81.0000 mg | DELAYED_RELEASE_TABLET | Freq: Every day | ORAL | 3 refills | Status: DC

## 2022-09-17 NOTE — Progress Notes (Unsigned)
Subjective:  Patient ID: Jason Walsh, male    DOB: August 13, 1961  Age: 61 y.o. MRN: [id number]  CC: Establish care  HPI  61 year old male with a history of skin cancer, history of substance abuse, prior CVA, tobacco abuse, hypertension presents to establish care.  He has not been seen by a primary care physician in quite some time.  Has multiple issues and concerns today.  Patient has a history of skin cancer.  He has several areas of concern currently.  He has nonhealing lesion which had a prior excision to the right temple.  He also has a nonhealing lesion to the right ear as well as the left ear.  Needs referral to dermatology.  Patient reports ongoing low back pain as well as right hip pain.  He localizes the right hip pain to the groin.  Severe at times.  Recently seen in the ER on 9/29.  He states that he has not had any imaging.  He has recently used naproxen that was prescribed for him for the pain.  He was also using muscle relaxers.  Endorses radicular symptoms down the right leg.  Patient has had a prior stroke.  He is not on aspirin or statin therapy.  Patient is a smoker.  Not interested in cessation at this time.  PMH, Surgical Hx, Family Hx, Social History reviewed.  Past Medical History:  Diagnosis Date   Cancer ([place]) 2010   skin   Lumbar disc disorder    Stroke [facility]) 2016   Past Surgical History:  Procedure Laterality Date   FINGER SURGERY     L index and pinky   IRRIGATION AND DEBRIDEMENT KNEE Right 07/10/2017   Procedure: RIGHT KNEE SCOPE IRRIGATION AND DEBRIDEMENT;  Surgeon: Sydnee Cabal, MD;  Location: [facility]S;  Service: Orthopedics;  Laterality: Right;   MENISCUS REPAIR  09/09/2017   NOSE SURGERY     skin cancer   SKIN CANCER EXCISION Bilateral    basal cell   Family History  Problem Relation Age of Onset   Cancer Mother        lymphoma   Hypertension Mother    Parkinson's disease Father    Alzheimer's disease Father    Social History   Tobacco  Use   Smoking status: Every Day    Packs/day: 1.00    Years: 51.00    Total pack years: 51.00    Types: Cigarettes    Start date: 02/02/1969   Smokeless tobacco: Never  Substance Use Topics   Alcohol use: Yes    Alcohol/week: 0.0 standard drinks of alcohol    Comment: occasional   Review of Systems Per HPI  Objective:   Today's Vitals: BP (!) 160/82   Pulse 83   Temp 98.4 F (36.9 C)   Ht '5\' 9"'  (1.753 m)   Wt 161 lb (73 kg)   SpO2 99%   BMI 23.78 kg/m   Physical Exam Vitals and nursing note reviewed.  Constitutional:      General: He is not in acute distress. HENT:     Head: Normocephalic and atraumatic.  Eyes:     General:        Right eye: No discharge.        Left eye: No discharge.     Conjunctiva/sclera: Conjunctivae normal.  Cardiovascular:     Rate and Rhythm: Normal rate and regular rhythm.  Pulmonary:     Effort: Pulmonary effort is normal.     Breath sounds: Normal  breath sounds. No wheezing, rhonchi or rales.  Abdominal:     General: There is no distension.     Palpations: Abdomen is soft.     Tenderness: There is no abdominal tenderness.  Musculoskeletal:     Comments: Decreased range of motion of the lumbar spine.  Skin:    Comments: Patient has several lesions that are concerning for skin cancer.  Lesion to the right temple with eschar.  Patient also has a similar lesion to the external right ear and the external left ear.  Neurological:     Mental Status: He is alert.  Psychiatric:        Mood and Affect: Mood normal.        Behavior: Behavior normal.      Assessment & Plan:   Problem List Items Addressed This Visit       Cardiovascular and Mediastinum   Essential hypertension    Starting on Norvasc.      Relevant Medications   aspirin EC 81 MG tablet   amLODipine (NORVASC) 5 MG tablet   atorvastatin (LIPITOR) 40 MG tablet   Other Relevant Orders   CMP14+EGFR (Completed)     Nervous and Auditory   Chronic right-sided low  back pain with right-sided sciatica    X-rays were obtained and revealed degenerative changes of the lumbar spine.  This is the source of his pain.  Hip imaging negative.  Tramadol as needed.  Will refer to neurosurgery.      Relevant Medications   aspirin EC 81 MG tablet   traMADol (ULTRAM) 50 MG tablet   Other Relevant Orders   DG Lumbar Spine Complete (Completed)   Ambulatory referral to Neurosurgery     Musculoskeletal and Integument   Cancer of skin of face    Patient has several concerning lesions.  Referring to dermatology at [facility].      Relevant Medications   aspirin EC 81 MG tablet   Other Relevant Orders   Ambulatory referral to Dermatology     Other   History of stroke    Advise daily aspirin therapy.  Needs aggressive control of hypertension.  Needs to quit smoking.      Hyperlipidemia    Given history of stroke,.  Starting on statin.      Relevant Medications   aspirin EC 81 MG tablet   amLODipine (NORVASC) 5 MG tablet   atorvastatin (LIPITOR) 40 MG tablet   Other Relevant Orders   Lipid panel (Completed)   Pain of right hip   Relevant Orders   DG Hip Unilat W OR W/O Pelvis Min 4 Views Right (Completed)   Other Visit Diagnoses     Screening for deficiency anemia       Relevant Orders   CBC (Completed)   Hyperglycemia       Relevant Orders   Hemoglobin A1c (Completed)   Screening PSA (prostate specific antigen)       Relevant Orders   PSA (Completed)       Meds ordered this encounter  Medications   aspirin EC 81 MG tablet    Sig: Take 1 tablet (81 mg total) by mouth daily. Swallow whole.    Dispense:  90 tablet    Refill:  3   traMADol (ULTRAM) 50 MG tablet    Sig: Take 1 tablet (50 mg total) by mouth every 8 (eight) hours as needed for moderate pain.    Dispense:  15 tablet    Refill:  0  amLODipine (NORVASC) 5 MG tablet    Sig: Take 1 tablet (5 mg total) by mouth daily.    Dispense:  90 tablet    Refill:  3   atorvastatin (LIPITOR)  40 MG tablet    Sig: Take 1 tablet (40 mg total) by mouth daily.    Dispense:  90 tablet    Refill:  3     Follow-up: Return in about 2 weeks (around 10/01/2022).  [place]

## 2022-09-17 NOTE — Patient Instructions (Signed)
Labs today.  Aspirin daily.  Medication as directed.  Xray at the hospital.  Follow up in 2 weeks.

## 2022-09-18 LAB — CMP14+EGFR
ALT: 18 IU/L (ref 0–44)
AST: 15 IU/L (ref 0–40)
Albumin/Globulin Ratio: 2 (ref 1.2–2.2)
Albumin: 4.1 g/dL (ref 3.9–4.9)
Alkaline Phosphatase: 87 IU/L (ref 44–121)
BUN/Creatinine Ratio: 13 (ref 10–24)
BUN: 14 mg/dL (ref 8–27)
Bilirubin Total: <0.2 mg/dL (ref 0.0–1.2)
CO2: 24 mmol/L (ref 20–29)
Calcium: 9.3 mg/dL (ref 8.6–10.2)
Chloride: 107 mmol/L — ABNORMAL HIGH (ref 96–106)
Creatinine, Ser: 1.09 mg/dL (ref 0.76–1.27)
Globulin, Total: 2.1 g/dL (ref 1.5–4.5)
Glucose: 118 mg/dL — ABNORMAL HIGH (ref 70–99)
Potassium: 4.6 mmol/L (ref 3.5–5.2)
Sodium: 147 mmol/L — ABNORMAL HIGH (ref 134–144)
Total Protein: 6.2 g/dL (ref 6.0–8.5)
eGFR: 77 mL/min/{1.73_m2} (ref >59)

## 2022-09-18 LAB — LIPID PANEL
Chol/HDL Ratio: 4.3 ratio (ref 0.0–5.0)
Cholesterol, Total: 180 mg/dL (ref 100–199)
HDL: 42 mg/dL (ref >39)
LDL Chol Calc (NIH): 93 mg/dL (ref 0–99)
Triglycerides: 271 mg/dL — ABNORMAL HIGH (ref 0–149)
VLDL Cholesterol Cal: 45 mg/dL — ABNORMAL HIGH (ref 5–40)

## 2022-09-18 LAB — CBC
Hematocrit: 42.2 % (ref 37.5–51.0)
Hemoglobin: 14.1 g/dL (ref 13.0–17.7)
MCH: 32.5 pg (ref 26.6–33.0)
MCHC: 33.4 g/dL (ref 31.5–35.7)
MCV: 97 fL (ref 79–97)
Platelets: 314 10*3/uL (ref 150–450)
RBC: 4.34 x10E6/uL (ref 4.14–5.80)
RDW: 13.2 % (ref 11.6–15.4)
WBC: 10.9 10*3/uL — ABNORMAL HIGH (ref 3.4–10.8)

## 2022-09-18 LAB — PSA: Prostate Specific Ag, Serum: 1.5 ng/mL (ref 0.0–4.0)

## 2022-09-18 LAB — HEMOGLOBIN A1C
Est. average glucose Bld gHb Est-mCnc: 111 mg/dL
Hgb A1c MFr Bld: 5.5 % (ref 4.8–5.6)

## 2022-09-18 MED ORDER — ATORVASTATIN CALCIUM 40 MG PO TABS: 40.0000 mg | ORAL_TABLET | Freq: Every day | ORAL | 3 refills | Status: DC

## 2022-09-18 MED ORDER — AMLODIPINE BESYLATE 5 MG PO TABS: 5.0000 mg | ORAL_TABLET | Freq: Every day | ORAL | 3 refills | Status: DC

## 2022-09-18 NOTE — Assessment & Plan Note (Signed)
Given history of stroke,.  Starting on statin.

## 2022-09-18 NOTE — Assessment & Plan Note (Signed)
Advise daily aspirin therapy.  Needs aggressive control of hypertension.  Needs to quit smoking.

## 2022-09-18 NOTE — Assessment & Plan Note (Signed)
Starting on Norvasc.

## 2022-09-18 NOTE — Assessment & Plan Note (Signed)
Patient has several concerning lesions.  Referring to dermatology at [facility].

## 2022-09-18 NOTE — Assessment & Plan Note (Signed)
X-rays were obtained and revealed degenerative changes of the lumbar spine.  This is the source of his pain.  Hip imaging negative.  Tramadol as needed.  Will refer to neurosurgery.

## 2022-10-01 ENCOUNTER — Ambulatory Visit (INDEPENDENT_AMBULATORY_CARE_PROVIDER_SITE_OTHER): Payer: 59 | Admitting: Family Medicine

## 2022-10-01 ENCOUNTER — Telehealth: Payer: Self-pay

## 2022-10-01 DIAGNOSIS — I10 Essential (primary) hypertension: Secondary | ICD-10-CM

## 2022-10-01 DIAGNOSIS — C44300 Unspecified malignant neoplasm of skin of unspecified part of face: Secondary | ICD-10-CM

## 2022-10-01 DIAGNOSIS — G8929 Other chronic pain: Secondary | ICD-10-CM

## 2022-10-01 DIAGNOSIS — M5441 Lumbago with sciatica, right side: Secondary | ICD-10-CM | POA: Diagnosis not present

## 2022-10-01 MED ORDER — AMLODIPINE BESYLATE 5 MG PO TABS: 10.0000 mg | ORAL_TABLET | Freq: Every day | ORAL | 3 refills | Status: DC

## 2022-10-01 NOTE — Assessment & Plan Note (Signed)
Has appointment with dermatology on 10/27.

## 2022-10-01 NOTE — Progress Notes (Signed)
Subjective:  Patient ID: Jason Walsh, male    DOB: [birth year]  Age: 61 y.o. MRN: [id number]  CC: Chief Complaint  Patient presents with   right hip pain     Have appt at [facility] for dermatology   HPI:  61 year old male with HTN, Chronic low back pain, Hx of skin cancer, Tobacco abuse, HLD presents for follow up.  He has an upcoming appointment with dermatology to address likely underlying skin cancers.  Hypertension is uncontrolled.  Currently on amlodipine 5 mg daily.  He needs to schedule his appointment with [facility] as well for ongoing issues with his lumbar spine.  He states that he is doing okay.  Having trouble with sleep (skin lesions cause discomfort/pain).  Patient Active Problem List   Diagnosis Date Noted   History of stroke 09/17/2022   History of substance use 09/17/2022   Cancer of skin of face 09/17/2022   Chronic right-sided low back pain with right-sided sciatica 09/17/2022   Essential hypertension 09/17/2022   Hyperlipidemia 01/04/2015   Tobacco abuse 01/03/2015    Social Hx   Social History   Socioeconomic History   Marital status: Legally Separated    Spouse name: Not on file   Number of children: Not on file   Years of education: Not on file   Highest education level: Not on file  Occupational History   Not on file  Tobacco Use   Smoking status: Every Day    Packs/day: 1.00    Years: 51.00    Total pack years: 51.00    Types: Cigarettes    Start date: 02/02/1969   Smokeless tobacco: Never  Vaping Use   Vaping Use: Never used  Substance and Sexual Activity   Alcohol use: Yes    Alcohol/week: 0.0 standard drinks of alcohol    Comment: occasional   Drug use: Yes    Types: Marijuana, Cocaine    Comment: occasional .  no cocaine sine 2016   Sexual activity: Yes    Partners: Female  Other Topics Concern   Not on file  Social History Narrative   Not on file   Social Determinants of Health   Financial  Resource Strain: Low Risk  (01/15/2019)   Overall Financial Resource Strain (CARDIA)    Difficulty of Paying Living Expenses: Not very hard  Food Insecurity: No Food Insecurity (01/15/2019)   Hunger Vital Sign    Worried About Running Out of Food in the Last Year: Never true    [place] in the Last Year: Never true  Transportation Needs: No Transportation Needs (01/15/2019)   PRAPARE - Hydrologist (Medical): No    Lack of Transportation (Non-Medical): No  Physical Activity: Not on file  Stress: No Stress Concern Present (01/15/2019)   [place]    Feeling of Stress : Only a little  Social Connections: Not on file    Review of Systems Per HPI  Objective:  BP (!) 164/88   Pulse 63   Temp (!) 97.2 F (36.2 C)   Ht '5\' 9"'$  (1.753 m)   Wt 164 lb (74.4 kg)   SpO2 99%   BMI 24.22 kg/m      10/01/2022    1:21 PM 10/01/2022    1:09 PM 09/17/2022    1:34 PM  BP/Weight  Systolic BP 458 099 833  Diastolic BP 88 80 82  Wt. (Lbs)  164 161  BMI  24.22 kg/m2 23.78 kg/m2    Physical Exam Vitals and nursing note reviewed.  Constitutional:      General: He is not in acute distress. HENT:     Head: Normocephalic and atraumatic.  Eyes:     General:        Right eye: No discharge.        Left eye: No discharge.     Conjunctiva/sclera: Conjunctivae normal.  Cardiovascular:     Rate and Rhythm: Normal rate and regular rhythm.  Pulmonary:     Effort: Pulmonary effort is normal.     Breath sounds: Normal breath sounds. No wheezing, rhonchi or rales.  Neurological:     Mental Status: He is alert.  Psychiatric:        Mood and Affect: Mood normal.        Behavior: Behavior normal.     Lab Results  Component Value Date   WBC 10.9 (H) 09/17/2022   HGB 14.1 09/17/2022   HCT 42.2 09/17/2022   PLT 314 09/17/2022   GLUCOSE 118 (H) 09/17/2022   CHOL 180 09/17/2022   TRIG 271 (H)  09/17/2022   HDL 42 09/17/2022   LDLCALC 93 09/17/2022   ALT 18 09/17/2022   AST 15 09/17/2022   NA 147 (H) 09/17/2022   K 4.6 09/17/2022   CL 107 (H) 09/17/2022   CREATININE 1.09 09/17/2022   BUN 14 09/17/2022   CO2 24 09/17/2022   INR 0.92 01/14/2019   HGBA1C 5.5 09/17/2022     Assessment & Plan:   Problem List Items Addressed This Visit       Cardiovascular and Mediastinum   Essential hypertension    Remains uncontrolled/not at goal.  Increasing amlodipine to 10 mg daily.      Relevant Medications   amLODipine (NORVASC) 5 MG tablet     Nervous and Auditory   Chronic right-sided low back pain with right-sided sciatica    I gave him the number for [place] [facility] so that he can call and schedule his appointment.        Musculoskeletal and Integument   Cancer of skin of face    Has appointment with dermatology on 10/27.       Meds ordered this encounter  Medications   amLODipine (NORVASC) 5 MG tablet    Sig: Take 2 tablets (10 mg total) by mouth daily.    Dispense:  90 tablet    Refill:  3    Follow-up: 1 month  [place]

## 2022-10-01 NOTE — Assessment & Plan Note (Signed)
I gave him the number for [facility] so that he can call and schedule his appointment.

## 2022-10-01 NOTE — Assessment & Plan Note (Signed)
Remains uncontrolled/not at goal.  Increasing amlodipine to 10 mg daily.

## 2022-10-01 NOTE — Telephone Encounter (Signed)
Caller name: DAIMIEN PATMON  On DPR?: Yes  Call back number: [phone]  Provider they see: Coral Spikes, DO  Reason for call:Pt said he got a appt reminder from [facility] this coming week and his insurance will not cover only one they will appro is   [place]

## 2022-10-01 NOTE — Patient Instructions (Signed)
Increase the Norvasc to 10 mg (2 tablets daily).  Follow up in 1 month.  Take care  Dr. Lacinda Axon

## 2022-10-01 NOTE — Telephone Encounter (Signed)
Patient had an appt today , request for dermatology at [facility].

## 2022-10-02 ENCOUNTER — Other Ambulatory Visit: Payer: Self-pay

## 2022-10-02 DIAGNOSIS — C44300 Unspecified malignant neoplasm of skin of unspecified part of face: Secondary | ICD-10-CM

## 2022-10-07 ENCOUNTER — Ambulatory Visit: Payer: Self-pay | Admitting: Family Medicine

## 2022-10-16 ENCOUNTER — Other Ambulatory Visit (HOSPITAL_COMMUNITY): Payer: Self-pay | Admitting: Neurosurgery

## 2022-10-16 DIAGNOSIS — M5441 Lumbago with sciatica, right side: Secondary | ICD-10-CM

## 2022-10-29 ENCOUNTER — Ambulatory Visit: Payer: 59 | Admitting: Family Medicine

## 2022-11-06 ENCOUNTER — Encounter (HOSPITAL_COMMUNITY): Payer: Self-pay

## 2022-11-06 ENCOUNTER — Ambulatory Visit (HOSPITAL_COMMUNITY): Admission: RE | Admit: 2022-11-06 | Payer: 59 | Source: Ambulatory Visit

## 2022-11-06 ENCOUNTER — Ambulatory Visit (INDEPENDENT_AMBULATORY_CARE_PROVIDER_SITE_OTHER): Payer: Self-pay | Admitting: Family Medicine

## 2022-11-06 VITALS — BP 174/89 | HR 64 | Temp 97.8°F | Wt 170.2 lb

## 2022-11-06 DIAGNOSIS — I10 Essential (primary) hypertension: Secondary | ICD-10-CM

## 2022-11-06 MED ORDER — AMLODIPINE BESYLATE 5 MG PO TABS: 10.0000 mg | ORAL_TABLET | Freq: Every day | ORAL | 3 refills | Status: DC

## 2022-11-06 NOTE — Patient Instructions (Signed)
Please take the blood pressure medication as prescribed.  Give it some time. Your BP is very high and puts you at high risk.  Follow up in 3 months if you can (due to insurance).  Take care  Dr. Lacinda Axon

## 2022-11-07 NOTE — Progress Notes (Signed)
Subjective:  Patient ID: Jason Walsh, male    DOB: 1961-09-18  Age: 61 y.o. MRN: [id number]  CC: Chief Complaint  Patient presents with   Follow-up    Patient states not taking amlodipine. Patient states it makes him feels run down.    HPI:  61 year old male with the below mentioned medical problems presents for follow-up.  Patient states that he is now having issues with his insurance.  He no longer has insurance.  He has had biopsies which have revealed skin cancer.  He is in need of Mohs surgery.  Patient's hypertension is uncontrolled.  He is no longer taking his amlodipine.  He states that he does not tolerate.  He states that he feels fatigued when he takes medication.  He has been on losartan in the past as well and did not tolerate that either.  Patient Active Problem List   Diagnosis Date Noted   History of stroke 09/17/2022   History of substance use 09/17/2022   Cancer of skin of face 09/17/2022   Chronic right-sided low back pain with right-sided sciatica 09/17/2022   Essential hypertension 09/17/2022   Hyperlipidemia 01/04/2015   Tobacco abuse 01/03/2015    Social Hx   Social History   Socioeconomic History   Marital status: Legally Separated    Spouse name: Not on file   Number of children: Not on file   Years of education: Not on file   Highest education level: Not on file  Occupational History   Not on file  Tobacco Use   Smoking status: Every Day    Packs/day: 1.00    Years: 51.00    Total pack years: 51.00    Types: Cigarettes    Start date: 02/02/1969   Smokeless tobacco: Never  Vaping Use   Vaping Use: Never used  Substance and Sexual Activity   Alcohol use: Yes    Alcohol/week: 0.0 standard drinks of alcohol    Comment: occasional   Drug use: Yes    Types: Marijuana, Cocaine    Comment: occasional .  no cocaine sine 2016   Sexual activity: Yes    Partners: Female  Other Topics Concern   Not on file  Social History Narrative   Not  on file   Social Determinants of Health   Financial Resource Strain: Low Risk  (01/15/2019)   Overall Financial Resource Strain (CARDIA)    Difficulty of Paying Living Expenses: Not very hard  Food Insecurity: No Food Insecurity (01/15/2019)   Hunger Vital Sign    Worried About Running Out of Food in the Last Year: Never true    [place] in the Last Year: Never true  Transportation Needs: No Transportation Needs (01/15/2019)   PRAPARE - Hydrologist (Medical): No    Lack of Transportation (Non-Medical): No  Physical Activity: Not on file  Stress: No Stress Concern Present (01/15/2019)   [place]    Feeling of Stress : Only a little  Social Connections: Not on file    Review of Systems  Constitutional:  Positive for fatigue.  Musculoskeletal:  Positive for back pain.   Objective:  BP (!) 174/89   Pulse 64   Temp 97.8 F (36.6 C) (Oral)   Wt 170 lb 3.2 oz (77.2 kg)   SpO2 99%   BMI 25.13 kg/m      11/06/2022   10:37 AM 11/06/2022   10:24  AM 10/01/2022    1:21 PM  BP/Weight  Systolic BP 315 176 160  Diastolic BP 89 89 88  Wt. (Lbs)  170.2   BMI  25.13 kg/m2     Physical Exam Vitals and nursing note reviewed.  Constitutional:      General: He is not in acute distress. HENT:     Head: Normocephalic and atraumatic.  Cardiovascular:     Rate and Rhythm: Normal rate and regular rhythm.  Pulmonary:     Effort: Pulmonary effort is normal.     Breath sounds: Normal breath sounds. No wheezing or rales.  Neurological:     Mental Status: He is alert.  Psychiatric:        Mood and Affect: Mood normal.        Behavior: Behavior normal.     Lab Results  Component Value Date   WBC 10.9 (H) 09/17/2022   HGB 14.1 09/17/2022   HCT 42.2 09/17/2022   PLT 314 09/17/2022   GLUCOSE 118 (H) 09/17/2022   CHOL 180 09/17/2022   TRIG 271 (H) 09/17/2022   HDL 42 09/17/2022    LDLCALC 93 09/17/2022   ALT 18 09/17/2022   AST 15 09/17/2022   NA 147 (H) 09/17/2022   K 4.6 09/17/2022   CL 107 (H) 09/17/2022   CREATININE 1.09 09/17/2022   BUN 14 09/17/2022   CO2 24 09/17/2022   INR 0.92 01/14/2019   HGBA1C 5.5 09/17/2022     Assessment & Plan:   Problem List Items Addressed This Visit       Cardiovascular and Mediastinum   Essential hypertension - Primary    Uncontrolled.  Worsening.  This is due to noncompliance. Patient is now without health insurance. I do not recommend alternative medication at this time due to the fact that he will not be returning on a regular basis for laboratory monitoring. He is a high risk patient.  I believe that his hypertension is longstanding and has been untreated and therefore when he takes the medication he feels poorly as he has been used to having very high blood pressure.  Advised to take it as prescribed and to give it some time.      Relevant Medications   amLODipine (NORVASC) 5 MG tablet    Meds ordered this encounter  Medications   amLODipine (NORVASC) 5 MG tablet    Sig: Take 2 tablets (10 mg total) by mouth daily.    Dispense:  90 tablet    Refill:  3    Follow-up:  Recommended 3 month follow   [place]

## 2022-11-07 NOTE — Assessment & Plan Note (Signed)
Uncontrolled.  Worsening.  This is due to noncompliance. Patient is now without health insurance. I do not recommend alternative medication at this time due to the fact that he will not be returning on a regular basis for laboratory monitoring. He is a high risk patient.  I believe that his hypertension is longstanding and has been untreated and therefore when he takes the medication he feels poorly as he has been used to having very high blood pressure.  Advised to take it as prescribed and to give it some time.

## 2022-11-18 ENCOUNTER — Other Ambulatory Visit (HOSPITAL_COMMUNITY): Payer: Self-pay | Admitting: Neurosurgery

## 2022-11-18 DIAGNOSIS — M5441 Lumbago with sciatica, right side: Secondary | ICD-10-CM

## 2022-12-06 ENCOUNTER — Other Ambulatory Visit (HOSPITAL_COMMUNITY): Payer: Self-pay | Admitting: Neurosurgery

## 2022-12-06 ENCOUNTER — Ambulatory Visit (HOSPITAL_COMMUNITY)
Admission: RE | Admit: 2022-12-06 | Discharge: 2022-12-06 | Disposition: A | Discharge status: Home / Self Care | Payer: Medicaid Other | Source: Ambulatory Visit | Attending: Neurosurgery | Admitting: Neurosurgery

## 2022-12-06 DIAGNOSIS — M5441 Lumbago with sciatica, right side: Secondary | ICD-10-CM | POA: Insufficient documentation

## 2022-12-06 DIAGNOSIS — T1590XA Foreign body on external eye, part unspecified, unspecified eye, initial encounter: Secondary | ICD-10-CM | POA: Insufficient documentation

## 2022-12-06 DIAGNOSIS — M47816 Spondylosis without myelopathy or radiculopathy, lumbar region: Secondary | ICD-10-CM | POA: Diagnosis not present

## 2022-12-06 DIAGNOSIS — M5126 Other intervertebral disc displacement, lumbar region: Secondary | ICD-10-CM | POA: Diagnosis not present

## 2022-12-06 DIAGNOSIS — Z135 Encounter for screening for eye and ear disorders: Secondary | ICD-10-CM | POA: Diagnosis not present

## 2022-12-16 DIAGNOSIS — M5441 Lumbago with sciatica, right side: Secondary | ICD-10-CM | POA: Diagnosis not present

## 2022-12-20 ENCOUNTER — Encounter: Payer: Self-pay | Admitting: Physical Medicine and Rehabilitation

## 2023-02-03 ENCOUNTER — Encounter: Payer: Self-pay | Admitting: Physical Medicine and Rehabilitation

## 2023-02-03 ENCOUNTER — Encounter
Payer: Medicaid Other | Attending: Physical Medicine and Rehabilitation | Admitting: Physical Medicine and Rehabilitation

## 2023-02-03 VITALS — BP 174/88 | HR 66 | Temp 98.1°F | Ht 69.0 in | Wt 170.0 lb

## 2023-02-03 DIAGNOSIS — G8929 Other chronic pain: Secondary | ICD-10-CM | POA: Diagnosis not present

## 2023-02-03 DIAGNOSIS — M5441 Lumbago with sciatica, right side: Secondary | ICD-10-CM | POA: Insufficient documentation

## 2023-02-03 NOTE — Progress Notes (Signed)
Test: Bilateral lower extremity EMG/NCS Impression: Bilateral peripheral sensory polyneuropathy. No findings of lumbosacral radiculopathy.  Full results to be scanned and sent to referring physician.    Gertie Gowda, DO 02/04/2023

## 2023-02-04 DIAGNOSIS — C44319 Basal cell carcinoma of skin of other parts of face: Secondary | ICD-10-CM | POA: Diagnosis not present

## 2023-02-06 DIAGNOSIS — G608 Other hereditary and idiopathic neuropathies: Secondary | ICD-10-CM | POA: Diagnosis not present

## 2023-02-06 DIAGNOSIS — Z6825 Body mass index (BMI) 25.0-25.9, adult: Secondary | ICD-10-CM | POA: Diagnosis not present

## 2023-02-07 ENCOUNTER — Encounter: Payer: Self-pay | Admitting: Neurology

## 2023-02-14 ENCOUNTER — Encounter: Payer: Medicaid Other | Admitting: Neurology

## 2023-02-17 DIAGNOSIS — C44319 Basal cell carcinoma of skin of other parts of face: Secondary | ICD-10-CM | POA: Diagnosis not present

## 2023-02-17 DIAGNOSIS — C44219 Basal cell carcinoma of skin of left ear and external auricular canal: Secondary | ICD-10-CM | POA: Diagnosis not present

## 2023-03-03 ENCOUNTER — Encounter: Payer: Self-pay | Admitting: Neurology

## 2023-03-03 ENCOUNTER — Ambulatory Visit: Payer: Medicaid Other | Admitting: Neurology

## 2023-03-03 VITALS — BP 153/83 | HR 72 | Ht 69.0 in | Wt 171.0 lb

## 2023-03-03 DIAGNOSIS — G629 Polyneuropathy, unspecified: Secondary | ICD-10-CM | POA: Diagnosis not present

## 2023-03-03 DIAGNOSIS — R202 Paresthesia of skin: Secondary | ICD-10-CM | POA: Diagnosis not present

## 2023-03-03 MED ORDER — GABAPENTIN 300 MG PO CAPS: ORAL_CAPSULE | ORAL | 5 refills | Status: DC

## 2023-03-03 NOTE — Progress Notes (Signed)
[facility] Clinic Note - Initial Visit   Date: 03/03/2023   [name] MRN: [id number] DOB: December 01, 1961   Dear Dr Cyndy Freeze:  Thank you for your kind referral of Jason Walsh for consultation of numbness/tingling. Although his history is well known to you, please allow Jason Walsh to reiterate it for the purpose of our medical record. The patient was accompanied to the clinic by self.     Jason Walsh is a 62 y.o. right-handed male with hypertension, hyperlipidemia, tobacco abuse, and skin cancer presenting for evaluation of neuropathy.   IMPRESSION/PLAN: Generalized paresthesias of the arms  - NCS/EMG arms to evaluate whether symptoms are due to entrapment neuropathy vs radiculopathy  - Start gabapentin 300mg  at bedtime x 1 week, then 300mg  twice daily  2.  Sensory neuropathy affecting the feet.  No history of diabetes, alcohol use, or chemotherapy.  EMG reviewed and shows mild sensory loss in the feet, which would not explain the diffuse nature of his pain.  MRI lumbar spie shows mild degenerative changes, nothing to explain his feet paresthesias.    - Gabapentin may help with feet paresthesias  - Going forward, additional imaging of the cervical spine may be indicated  Return to clinic in 4 months  ------------------------------------------------------------- History of present illness: For the past 20 years, he has numbness/tingling in the arms and legs.  He occasionally has pain that radiates from his neck into the arms.  Other times, he has bilateral leg and hip pain.  He feels that symptoms are worse with activity. He took a friends gabapentin which helped with pain.  EMG shows mild sensory neuropathy. MRI lumbar spine shows mild degenerative changes without compressive pathology.  He is referred for further evaluation.    He works as a Games developer.   Out-side paper records, electronic medical record, and images have been reviewed where available and  summarized as:  Lab Results  Component Value Date   HGBA1C 5.5 09/17/2022   No results found for: "VITAMINB12" No results found for: "TSH" Lab Results  Component Value Date   ESRSEDRATE 78 (H) 07/11/2017    Past Medical History:  Diagnosis Date   Cancer ([place]) 2010   skin   Lumbar disc disorder    Stroke ([place]) 2016    Past Surgical History:  Procedure Laterality Date   FINGER SURGERY     L index and pinky   IRRIGATION AND DEBRIDEMENT KNEE Right 07/10/2017   Procedure: RIGHT KNEE SCOPE IRRIGATION AND DEBRIDEMENT;  Surgeon: Sydnee Cabal, MD;  Location: [facility];  Service: Orthopedics;  Laterality: Right;   MENISCUS REPAIR  09/09/2017   NOSE SURGERY     skin cancer   SKIN CANCER EXCISION Bilateral    basal cell     Medications:  Outpatient Encounter Medications as of 03/03/2023  Medication Sig   gabapentin (NEURONTIN) 300 MG capsule Take 1 tablet at bedtime x 1 week, then increase to 1 tablet twice daily.   amLODipine (NORVASC) 5 MG tablet Take 2 tablets (10 mg total) by mouth daily. (Patient not taking: Reported on 02/03/2023)   atorvastatin (LIPITOR) 40 MG tablet Take 1 tablet by mouth daily. (Patient not taking: Reported on 02/03/2023)   atorvastatin (LIPITOR) 40 MG tablet Take by mouth. (Patient not taking: Reported on 03/03/2023)   cyclobenzaprine (FLEXERIL) 10 MG tablet Take by mouth. (Patient not taking: Reported on 02/03/2023)   sulfamethoxazole-trimethoprim (BACTRIM DS) 800-160 MG tablet Take by mouth. (Patient not taking: Reported on 02/03/2023)  No facility-administered encounter medications on file as of 03/03/2023.    Allergies:  Allergies  Allergen Reactions   Ibuprofen Other (See Comments) and Hives    Chest pains    Family History: Family History  Problem Relation Age of Onset   Cancer Mother        lymphoma   Hypertension Mother    Parkinson's disease Father    Alzheimer's disease Father     Social History: Social History   Tobacco Use    Smoking status: Every Day    Packs/day: 1.00    Years: 51.00    Additional pack years: 0.00    Total pack years: 51.00    Types: Cigarettes    Start date: 02/02/1969   Smokeless tobacco: Never  Vaping Use   Vaping Use: Never used  Substance Use Topics   Alcohol use: Not Currently    Comment: occasional   Drug use: Yes    Types: Marijuana, Cocaine    Comment: occasional .  no cocaine sine 2016   Social History   Social History Narrative   Right Handed    Lives in one story home. Lives alone    Vital Signs:  BP (!) 153/83   Pulse 72   Ht 5\' 9"  (1.753 m)   Wt 171 lb (77.6 kg)   SpO2 97%   BMI 25.25 kg/m    Neurological Exam: MENTAL STATUS including orientation to time, place, person, recent and remote memory, attention span and concentration, language, and fund of knowledge is normal.  Speech is not dysarthric.  Multiple areas of the face with skin resection which are healing.  CRANIAL NERVES: II:  No visual field defects.     III-IV-VI: Pupils equal round and reactive to light.  Normal conjugate, extra-ocular eye movements in all directions of gaze.  No nystagmus.  No ptosis.   V:  Normal facial sensation.    VII:  Normal facial symmetry and movements.   VIII:  Normal hearing and vestibular function.   IX-X:  Normal palatal movement.   XI:  Normal shoulder shrug and head rotation.   XII:  Normal tongue strength and range of motion, no deviation or fasciculation.  MOTOR:  No atrophy, fasciculations or abnormal movements.  No pronator drift.   Upper Extremity:  Right  Left  Deltoid  5/5   5/5   Biceps  5/5   5/5   Triceps  5/5   5/5   Wrist extensors  5/5   5/5   Wrist flexors  5/5   5/5   Finger extensors  5/5   5/5   Finger flexors  5/5   5/5   Dorsal interossei  5/5   5/5   Abductor pollicis  5/5   5/5   Tone (Ashworth scale)  0  0   Lower Extremity:  Right  Left  Hip flexors  5/5   5/5   Knee flexors  5/5   5/5   Knee extensors  5/5   5/5   Dorsiflexors   5/5   5/5   Plantarflexors  5/5   5/5   Toe extensors  5/5   5/5   Toe flexors  5/5   5/5   Tone (Ashworth scale)  0  0   MSRs:  Right        Left brachioradialis 2+  2+  biceps 2+  2+  triceps 2+  2+  patellar 2+  2+  ankle jerk 0  0  Hoffman no  no  plantar response down  down   SENSORY:  Vibration reduced distally in the feet.  Temperature and pin prick intact. Romberg's sign absent.   COORDINATION/GAIT: Normal finger-to- nose-finger.  Intact rapid alternating movements bilaterally.  Gait mildly wide-based, stable, unassisted. Tandem and stressed gait intact.     Thank you for allowing me to participate in patient's care.  If I can answer any additional questions, I would be pleased to do so.    Sincerely,    Kaelyn Nauta K. Posey Pronto, DO

## 2023-03-03 NOTE — Patient Instructions (Addendum)
Start gabapentin 300mg  at bedtime x 1 week, then 1 tablet twice daily  Nerve testing of arms  Return to clinic 4 months  ELECTROMYOGRAM AND NERVE CONDUCTION STUDIES (EMG/NCS) INSTRUCTIONS  How to Prepare The neurologist conducting the EMG will need to know if you have certain medical conditions. Tell the neurologist and other EMG lab personnel if you: Have a pacemaker or any other electrical medical device Take blood-thinning medications Have hemophilia, a blood-clotting disorder that causes prolonged bleeding Bathing Take a shower or bath shortly before your exam in order to remove oils from your skin. Don't apply lotions or creams before the exam.  What to Expect You'll likely be asked to change into a hospital gown for the procedure and lie down on an examination table. The following explanations can help you understand what will happen during the exam.  Electrodes. The neurologist or a technician places surface electrodes at various locations on your skin depending on where you're experiencing symptoms. Or the neurologist may insert needle electrodes at different sites depending on your symptoms.  Sensations. The electrodes will at times transmit a tiny electrical current that you may feel as a twinge or spasm. The needle electrode may cause discomfort or pain that usually ends shortly after the needle is removed. If you are concerned about discomfort or pain, you may want to talk to the neurologist about taking a short break during the exam.  Instructions. During the needle EMG, the neurologist will assess whether there is any spontaneous electrical activity when the muscle is at rest - activity that isn't present in healthy muscle tissue - and the degree of activity when you slightly contract the muscle.  He or she will give you instructions on resting and contracting a muscle at appropriate times. Depending on what muscles and nerves the neurologist is examining, he or she may ask you to  change positions during the exam.  After your EMG You may experience some temporary, minor bruising where the needle electrode was inserted into your muscle. This bruising should fade within several days. If it persists, contact your primary care doctor.

## 2023-03-06 ENCOUNTER — Ambulatory Visit: Payer: Medicaid Other | Admitting: Neurology

## 2023-03-06 ENCOUNTER — Encounter: Payer: Self-pay | Admitting: Neurology

## 2023-03-06 VITALS — BP 189/106 | HR 69 | Ht 69.0 in

## 2023-03-06 DIAGNOSIS — M5412 Radiculopathy, cervical region: Secondary | ICD-10-CM | POA: Diagnosis not present

## 2023-03-06 DIAGNOSIS — R292 Abnormal reflex: Secondary | ICD-10-CM

## 2023-03-06 DIAGNOSIS — R29898 Other symptoms and signs involving the musculoskeletal system: Secondary | ICD-10-CM

## 2023-03-06 MED ORDER — CYCLOBENZAPRINE HCL 10 MG PO TABS: 10.0000 mg | ORAL_TABLET | Freq: Every day | ORAL | 3 refills | Status: DC

## 2023-03-06 NOTE — Progress Notes (Signed)
Follow-up Visit   Date: 03/06/2023    ELIC ALBERTI MRN: [id number] DOB: Jan 11, 1961    Jason Walsh is a 62 y.o. right-handed Caucasian male with hypertension, hyperlipidemia, tobacco use, and skin cancer returning to the clinic for follow-up of neck pain.  The patient was accompanied to the clinic by self.   IMPRESSION/PLAN: Cervicalgia Bilateral arm weakness Hyperreflexia Elevated blood pressure due to pain  - MRI cervical spine without contrast - Start flexeril 10mg  at bedtime - Continue gabapentin 300mg  BID - Monitor BP at home and if it remains elevated, follow-up with PCP.  If he develops any shortness of breath, vision changes, chest pain, or new neurological symptoms, he was instructed to go to the ER.    Further recommendations pending results.   --------------------------------------------- History of present illness: For the past 20 years, he has numbness/tingling in the arms and legs.  He occasionally has pain that radiates from his neck into the arms.  Other times, he has bilateral leg and hip pain.  He feels that symptoms are worse with activity. He took a friends gabapentin which helped with pain.  EMG shows mild sensory neuropathy. MRI lumbar spine shows mild degenerative changes without compressive pathology.  He is referred for further evaluation.     He works as a Games developer.   UPDATE 03/06/2023:  He was scheduled to have EMG of the arms, but patient reports having new neck pain which started 4 days ago which has greatly limited his range of motion and causing severe neck pain shooting down his arms and back.  He has been sleeping in a recliner for the past several days because he is unable to get back up out of bed due to pain.  He has reduced arm ROM and pain-limiting weakness.  He has been taking gabapentin for pain, but nothing improves it.    Due to severity of pain, EMG was cancelled today and he was seen as a follow-up visit.   Medications:   Current Outpatient Medications on File Prior to Visit  Medication Sig Dispense Refill   cyclobenzaprine (FLEXERIL) 10 MG tablet Take 1 tablet (10 mg total) by mouth at bedtime. 30 tablet 3   gabapentin (NEURONTIN) 300 MG capsule Take 1 tablet at bedtime x 1 week, then increase to 1 tablet twice daily. 60 capsule 5   amLODipine (NORVASC) 5 MG tablet Take 2 tablets (10 mg total) by mouth daily. (Patient not taking: Reported on 02/03/2023) 90 tablet 3   atorvastatin (LIPITOR) 40 MG tablet Take 1 tablet by mouth daily. (Patient not taking: Reported on 02/03/2023)     atorvastatin (LIPITOR) 40 MG tablet Take by mouth. (Patient not taking: Reported on 03/03/2023)     sulfamethoxazole-trimethoprim (BACTRIM DS) 800-160 MG tablet Take by mouth. (Patient not taking: Reported on 02/03/2023)     No current facility-administered medications on file prior to visit.    Allergies:  Allergies  Allergen Reactions   Ibuprofen Other (See Comments) and Hives    Chest pains    Vital Signs:  BP (!) 189/106   Pulse 69   Ht 5\' 9"  (1.753 m)   SpO2 96%   BMI 25.25 kg/m   Neurological Exam: MENTAL STATUS including orientation to time, place, person, recent and remote memory, attention span and concentration, language, and fund of knowledge is normal.  Speech is not dysarthric.  He appears very rigid in the neck, avoiding flexion and neck rotation  CRANIAL NERVES:   Pupils equal  round and reactive to light.  Normal conjugate, extra-ocular eye movements in all directions of gaze.  No ptosis.  Face is symmetric.   MOTOR:  Motor strength is 4/5 in the arms, and 5/5 in the legs.  No atrophy, fasciculations or abnormal movements.  No pronator drift.  Tone is normal.    MSRs:  Reflexes are 3+/4 in the arms and 2+/4 in the legs.   SENSORY:  Intact to vibration throughout.  COORDINATION/GAIT:    Intact rapid alternating movements bilaterally.  Mild hyperextension of the cervical region with reduced neck and arm ROM  when walking.  Unassisted, stable.   Data: n/a   Thank you for allowing me to participate in patient's care.  If I can answer any additional questions, I would be pleased to do so.    Sincerely,    Travarius Lange K. Posey Pronto, DO

## 2023-03-10 NOTE — Progress Notes (Signed)
EMG cancelled, see follow-up note.

## 2023-03-12 ENCOUNTER — Telehealth: Payer: Self-pay

## 2023-03-12 NOTE — Telephone Encounter (Signed)
Called patient to inform about PA approval and to provide him with [facility] phone number [phone] so he may call and schedule his MRI cervical.   Unable to leave a message due to phone not having a mailbox set up.

## 2023-03-12 NOTE — Telephone Encounter (Signed)
[place] [employer] at [id number] to initiate Prior Auth for MRI Cervical Spine w/o contrast.  Spoke to [facility]. Location in network with patients insurance: [facility] Address: [address] [place], [place], [place] [address] Phone: [phone].  Case #: [id number]. CPT [phone] code approved. Approval [id number] Valid from 03/12/23-04/26/2023.

## 2023-04-08 ENCOUNTER — Ambulatory Visit (HOSPITAL_COMMUNITY)
Admission: RE | Admit: 2023-04-08 | Discharge: 2023-04-08 | Disposition: A | Discharge status: Home / Self Care | Payer: Medicaid Other | Source: Ambulatory Visit | Attending: Neurology | Admitting: Neurology

## 2023-04-08 DIAGNOSIS — R29898 Other symptoms and signs involving the musculoskeletal system: Secondary | ICD-10-CM | POA: Diagnosis present

## 2023-04-08 DIAGNOSIS — M47812 Spondylosis without myelopathy or radiculopathy, cervical region: Secondary | ICD-10-CM | POA: Diagnosis not present

## 2023-04-08 DIAGNOSIS — M5412 Radiculopathy, cervical region: Secondary | ICD-10-CM

## 2023-04-08 DIAGNOSIS — R292 Abnormal reflex: Secondary | ICD-10-CM | POA: Diagnosis not present

## 2023-04-08 DIAGNOSIS — M542 Cervicalgia: Secondary | ICD-10-CM | POA: Diagnosis not present

## 2023-04-09 DIAGNOSIS — C44319 Basal cell carcinoma of skin of other parts of face: Secondary | ICD-10-CM | POA: Diagnosis not present

## 2023-04-09 DIAGNOSIS — C44219 Basal cell carcinoma of skin of left ear and external auricular canal: Secondary | ICD-10-CM | POA: Diagnosis not present

## 2023-04-18 ENCOUNTER — Other Ambulatory Visit: Payer: Self-pay

## 2023-04-18 DIAGNOSIS — R292 Abnormal reflex: Secondary | ICD-10-CM

## 2023-04-18 DIAGNOSIS — M5412 Radiculopathy, cervical region: Secondary | ICD-10-CM

## 2023-04-18 DIAGNOSIS — M542 Cervicalgia: Secondary | ICD-10-CM

## 2023-05-21 ENCOUNTER — Ambulatory Visit (HOSPITAL_COMMUNITY): Payer: Medicaid Other | Admitting: Physical Therapy

## 2023-06-30 ENCOUNTER — Ambulatory Visit: Payer: Medicaid Other | Admitting: Neurology

## 2023-06-30 ENCOUNTER — Encounter: Payer: Self-pay | Admitting: Neurology

## 2023-07-28 ENCOUNTER — Encounter: Payer: Self-pay | Admitting: Neurology

## 2023-07-28 ENCOUNTER — Ambulatory Visit: Payer: Medicaid Other | Admitting: Neurology

## 2023-07-28 VITALS — BP 166/96 | HR 102 | Ht 69.0 in | Wt 169.0 lb

## 2023-07-28 DIAGNOSIS — M47812 Spondylosis without myelopathy or radiculopathy, cervical region: Secondary | ICD-10-CM | POA: Diagnosis not present

## 2023-07-28 MED ORDER — CYCLOBENZAPRINE HCL 10 MG PO TABS: 10.0000 mg | ORAL_TABLET | Freq: Every day | ORAL | 3 refills | Status: DC

## 2023-07-28 NOTE — Patient Instructions (Signed)
I will see you back in 6 months 

## 2023-07-28 NOTE — Progress Notes (Signed)
Follow-up Visit   Date: 07/28/2023    Jason Walsh MRN: [id number] DOB: 10-25-1961    Jason Walsh is a 62 y.o. right-handed Caucasian male with hypertension, hyperlipidemia, tobacco use, and skin cancer returning to the clinic for follow-up of neck pain.  The patient was accompanied to the clinic by self.   IMPRESSION/PLAN: Cervical spondylosis worse at C5-6 where there is spinal canal stenosis and bilateral neural foraminal stenosis (severe)  - Continue flexeril 10mg  at bedtime as needed   Return to clinic in 6 months  --------------------------------------------- History of present illness: For the past 20 years, he has numbness/tingling in the arms and legs.  He occasionally has pain that radiates from his neck into the arms.  Other times, he has bilateral leg and hip pain.  He feels that symptoms are worse with activity. He took a friends gabapentin which helped with pain.  EMG shows mild sensory neuropathy. MRI lumbar spine shows mild degenerative changes without compressive pathology.  He is referred for further evaluation.     He works as a Music therapist.   UPDATE 03/06/2023:  He was scheduled to have EMG of the arms, but patient reports having new neck pain which started 4 days ago which has greatly limited his range of motion and causing severe neck pain shooting down his arms and back.  He has been sleeping in a recliner for the past several days because he is unable to get back up out of bed due to pain.  He has reduced arm ROM and pain-limiting weakness.  He has been taking gabapentin for pain, but nothing improves it.    Due to severity of pain, EMG was cancelled today and he was seen as a follow-up visit.   UPDATE 07/28/2023:  He is here for follow-up visit.  He reports that neck pain is doing better since taking flexeril 10mg  at bedtime.  He stopped gabapentin because he felt that it was causing worsening shooting pain through his body.  He works 4-5 hours daily and  takes breaks as needed for his neck and low back pain.   Medications:  Current Outpatient Medications on File Prior to Visit  Medication Sig Dispense Refill   cyclobenzaprine (FLEXERIL) 10 MG tablet Take 1 tablet (10 mg total) by mouth at bedtime. 30 tablet 3   amLODipine (NORVASC) 5 MG tablet Take 2 tablets (10 mg total) by mouth daily. (Patient not taking: Reported on 02/03/2023) 90 tablet 3   atorvastatin (LIPITOR) 40 MG tablet Take 1 tablet by mouth daily. (Patient not taking: Reported on 02/03/2023)     atorvastatin (LIPITOR) 40 MG tablet Take by mouth. (Patient not taking: Reported on 03/03/2023)     gabapentin (NEURONTIN) 300 MG capsule Take 1 tablet at bedtime x 1 week, then increase to 1 tablet twice daily. (Patient not taking: Reported on 07/28/2023) 60 capsule 5   sulfamethoxazole-trimethoprim (BACTRIM DS) 800-160 MG tablet Take by mouth. (Patient not taking: Reported on 02/03/2023)     No current facility-administered medications on file prior to visit.    Allergies:  Allergies  Allergen Reactions   Ibuprofen Other (See Comments) and Hives    Chest pains   Gabapentin     Pain     Vital Signs:  BP (!) 166/96   Pulse (!) 102   Ht 5\' 9"  (1.753 m)   Wt 169 lb (76.7 kg)   SpO2 99%   BMI 24.96 kg/m   Neurological Exam: MENTAL STATUS including orientation  to time, place, person, recent and remote memory, attention span and concentration, language, and fund of knowledge is normal.  Speech is not dysarthric.  He appears much more comfortable today.   CRANIAL NERVES:   Pupils equal round and reactive to light.  Normal conjugate, extra-ocular eye movements in all directions of gaze.  No ptosis.  Face is symmetric.   MOTOR:  Motor strength is 5/5 in the arms (improved) and 5/5 in the legs.  No atrophy, fasciculations or abnormal movements.  No pronator drift.  Tone is normal.    MSRs:  Reflexes are 2+/4 in the arms and 2+/4 in the legs.   SENSORY:  Intact to vibration  throughout.  COORDINATION/GAIT:    Intact rapid alternating movements bilaterally. Gait appears normal, unassisted.   Data:  MRI cervical spine wo contrast 04/11/2023: 1. Multilevel cervical spondylosis, worst at C5-6, where there is moderate spinal canal stenosis and severe bilateral neural foraminal narrowing. 2. Mild spinal canal stenosis at C3-4, C4-5, and C6-7. 3. Multilevel moderate to severe neural foraminal narrowing, as described above.  MRI lumbar spine wo contrast 12/10/2022: 1. Mild lumbar spine spondylosis as described above. 2. No acute osseous injury of the lumbar spine.   Thank you for allowing me to participate in patient's care.  If I can answer any additional questions, I would be pleased to do so.    Sincerely,    Ecko Beasley K. Allena Katz, DO

## 2023-09-22 ENCOUNTER — Ambulatory Visit (INDEPENDENT_AMBULATORY_CARE_PROVIDER_SITE_OTHER): Payer: Medicaid Other | Admitting: Family Medicine

## 2023-09-22 VITALS — BP 164/83 | HR 83 | Temp 98.4°F | Ht 69.0 in | Wt 171.8 lb

## 2023-09-22 DIAGNOSIS — I10 Essential (primary) hypertension: Secondary | ICD-10-CM | POA: Diagnosis not present

## 2023-09-22 DIAGNOSIS — G8929 Other chronic pain: Secondary | ICD-10-CM

## 2023-09-22 DIAGNOSIS — M5441 Lumbago with sciatica, right side: Secondary | ICD-10-CM | POA: Diagnosis not present

## 2023-09-22 MED ORDER — TRAMADOL HCL 50 MG PO TABS: 50.0000 mg | ORAL_TABLET | Freq: Three times a day (TID) | ORAL | 0 refills | Status: AC | PRN

## 2023-09-22 MED ORDER — LOSARTAN POTASSIUM 50 MG PO TABS: 50.0000 mg | ORAL_TABLET | Freq: Every day | ORAL | 1 refills | Status: DC

## 2023-09-22 NOTE — Patient Instructions (Signed)
Lab in 2 weeks.  Medication as prescribed.  Referral placed.

## 2023-09-23 NOTE — Assessment & Plan Note (Addendum)
Discussed treatment approaches including medication, physical therapy, referral. Tramadol as needed.  Patient is already on Flexeril.  Referring to physical medicine & rehabilitation.

## 2023-09-23 NOTE — Progress Notes (Signed)
Subjective:  Patient ID: Jason Walsh, male    DOB: [birth year]  Age: 62 y.o. MRN: [id number]  CC:  Back pain  HPI:  62 year old male with hypertension, chronic low back pain, tobacco abuse, history of stroke, hyperlipidemia presents for evaluation of the above.  Patient continues to have low back pain.  Has been seen by neurosurgery.  Had an MRI which showed spondylosis.  Has not had any intervention.  Pain is quite troublesome.  No radicular symptoms at this time.  Difficulty doing his normal activities.  Worse with activity.  Patient's hypertension is uncontrolled.  He has previously stopped taking amlodipine as he states that it made him drowsy.  He now has insurance.  Needs treatment.  Patient Active Problem List   Diagnosis Date Noted   History of stroke 09/17/2022   History of substance use 09/17/2022   Cancer of skin of face 09/17/2022   Chronic right-sided low back pain with right-sided sciatica 09/17/2022   Essential hypertension 09/17/2022   Hyperlipidemia 01/04/2015   Tobacco abuse 01/03/2015    Social Hx   Social History   Socioeconomic History   Marital status: Legally Separated    Spouse name: Not on file   Number of children: Not on file   Years of education: Not on file   Highest education level: Not on file  Occupational History   Not on file  Tobacco Use   Smoking status: Every Day    Current packs/day: 1.00    Average packs/day: 1 pack/day for 54.6 years (54.6 ttl pk-yrs)    Types: Cigarettes    Start date: 02/02/1969   Smokeless tobacco: Never  Vaping Use   Vaping status: Never Used  Substance and Sexual Activity   Alcohol use: Not Currently    Comment: occasional   Drug use: Yes    Types: Marijuana, Cocaine    Comment: occasional .  no cocaine sine 2016   Sexual activity: Yes    Partners: Female  Other Topics Concern   Not on file  Social History Narrative   Right Handed    Lives in one story home. Lives alone   Social Determinants of  Health   Financial Resource Strain: Low Risk  (01/15/2019)   Overall Financial Resource Strain (CARDIA)    Difficulty of Paying Living Expenses: Not very hard  Food Insecurity: No Food Insecurity (01/15/2019)   Hunger Vital Sign    Worried About Running Out of Food in the Last Year: Never true    Ran Out of Food in the Last Year: Never true  Transportation Needs: No Transportation Needs (01/15/2019)   PRAPARE - Administrator, Civil Service (Medical): No    Lack of Transportation (Non-Medical): No  Physical Activity: Not on file  Stress: No Stress Concern Present (01/15/2019)   [employer] of Occupational Health - Occupational Stress Questionnaire    Feeling of Stress : Only a little  Social Connections: Not on file    Review of Systems Per HPI  Objective:  BP (!) 164/83   Pulse 83   Temp 98.4 F (36.9 C)   Ht 5\' 9"  (1.753 m)   Wt 171 lb 12.8 oz (77.9 kg)   SpO2 98%   BMI 25.37 kg/m      09/22/2023    1:55 PM 07/28/2023    3:18 PM 07/28/2023    3:17 PM  BP/Weight  Systolic BP 164 166 167  Diastolic BP 83 96 95  Wt. (Lbs)  171.8  169  BMI 25.37 kg/m2  24.96 kg/m2    Physical Exam Vitals and nursing note reviewed.  Constitutional:      General: He is not in acute distress.    Appearance: Normal appearance.  HENT:     Head: Normocephalic and atraumatic.  Cardiovascular:     Rate and Rhythm: Normal rate and regular rhythm.  Pulmonary:     Effort: Pulmonary effort is normal.     Breath sounds: Normal breath sounds. No wheezing, rhonchi or rales.  Musculoskeletal:     Comments: Decreased range of motion of the lumbar spine.  Neurological:     Mental Status: He is alert.  Psychiatric:     Comments: Flat affect.     Lab Results  Component Value Date   WBC 10.9 (H) 09/17/2022   HGB 14.1 09/17/2022   HCT 42.2 09/17/2022   PLT 314 09/17/2022   GLUCOSE 118 (H) 09/17/2022   CHOL 180 09/17/2022   TRIG 271 (H) 09/17/2022   HDL 42 09/17/2022    LDLCALC 93 09/17/2022   ALT 18 09/17/2022   AST 15 09/17/2022   NA 147 (H) 09/17/2022   K 4.6 09/17/2022   CL 107 (H) 09/17/2022   CREATININE 1.09 09/17/2022   BUN 14 09/17/2022   CO2 24 09/17/2022   INR 0.92 01/14/2019   HGBA1C 5.5 09/17/2022     Assessment & Plan:   Problem List Items Addressed This Visit       Cardiovascular and Mediastinum   Essential hypertension    Uncontrolled.  Starting on losartan.  Metabolic panel in 2 weeks.      Relevant Medications   losartan (COZAAR) 50 MG tablet   Other Relevant Orders   Basic Metabolic Panel     Nervous and Auditory   Chronic right-sided low back pain with right-sided sciatica - Primary    Discussed treatment approaches including medication, physical therapy, referral. Tramadol as needed.  Patient is already on Flexeril.  Referring to physical medicine & rehabilitation.      Relevant Medications   traMADol (ULTRAM) 50 MG tablet   Other Relevant Orders   Ambulatory referral to Physical Medicine Rehab    Meds ordered this encounter  Medications   losartan (COZAAR) 50 MG tablet    Sig: Take 1 tablet (50 mg total) by mouth daily.    Dispense:  90 tablet    Refill:  1   traMADol (ULTRAM) 50 MG tablet    Sig: Take 1 tablet (50 mg total) by mouth every 8 (eight) hours as needed for up to 5 days.    Dispense:  15 tablet    Refill:  0    Follow-up:  Return in about 1 month (around 10/23/2023).  Everlene Other DO [facility]

## 2023-09-23 NOTE — Assessment & Plan Note (Signed)
Uncontrolled.  Starting on losartan.  Metabolic panel in 2 weeks.

## 2023-10-06 ENCOUNTER — Encounter: Payer: Self-pay | Admitting: Physical Medicine and Rehabilitation

## 2023-10-08 DIAGNOSIS — I10 Essential (primary) hypertension: Secondary | ICD-10-CM | POA: Diagnosis not present

## 2023-10-09 LAB — BASIC METABOLIC PANEL
BUN/Creatinine Ratio: 12 (ref 10–24)
BUN: 15 mg/dL (ref 8–27)
CO2: 26 mmol/L (ref 20–29)
Calcium: 9.3 mg/dL (ref 8.6–10.2)
Chloride: 106 mmol/L (ref 96–106)
Creatinine, Ser: 1.24 mg/dL (ref 0.76–1.27)
Glucose: 103 mg/dL — ABNORMAL HIGH (ref 70–99)
Potassium: 4 mmol/L (ref 3.5–5.2)
Sodium: 146 mmol/L — ABNORMAL HIGH (ref 134–144)
eGFR: 65 mL/min/{1.73_m2} (ref >59)

## 2023-10-23 ENCOUNTER — Ambulatory Visit: Payer: Medicaid Other | Admitting: Family Medicine

## 2023-10-23 VITALS — BP 134/71 | HR 72 | Temp 98.1°F | Ht 69.0 in | Wt 162.6 lb

## 2023-10-23 DIAGNOSIS — E785 Hyperlipidemia, unspecified: Secondary | ICD-10-CM

## 2023-10-23 DIAGNOSIS — Z720 Tobacco use: Secondary | ICD-10-CM

## 2023-10-23 DIAGNOSIS — I10 Essential (primary) hypertension: Secondary | ICD-10-CM | POA: Diagnosis not present

## 2023-10-23 DIAGNOSIS — M5441 Lumbago with sciatica, right side: Secondary | ICD-10-CM

## 2023-10-23 DIAGNOSIS — G8929 Other chronic pain: Secondary | ICD-10-CM

## 2023-10-23 MED ORDER — DICLOFENAC SODIUM 75 MG PO TBEC: 75.0000 mg | DELAYED_RELEASE_TABLET | Freq: Two times a day (BID) | ORAL | 1 refills | Status: DC | PRN

## 2023-10-23 MED ORDER — ASPIRIN 81 MG PO TBEC: 81.0000 mg | DELAYED_RELEASE_TABLET | Freq: Every day | ORAL | 3 refills | Status: DC

## 2023-10-23 MED ORDER — AMLODIPINE BESYLATE 10 MG PO TABS: 10.0000 mg | ORAL_TABLET | Freq: Every day | ORAL | 3 refills | Status: DC

## 2023-10-23 NOTE — Assessment & Plan Note (Signed)
He has been started on statin multiple times but is noncompliant regarding this.  Will continue to monitor.  Will continue to revisit in the future.

## 2023-10-23 NOTE — Patient Instructions (Addendum)
Aspirin daily.  Continue your blood pressure medication.  Consider cholesterol medication given your history of stroke.  Recommend Colonoscopy.  We are arranging lung cancer screening.  Diclofenac as directed.  Follow up in 6 months.

## 2023-10-23 NOTE — Assessment & Plan Note (Signed)
Does not want a quit.  He is amenable to lung cancer screening.  Referral placed.

## 2023-10-23 NOTE — Assessment & Plan Note (Signed)
Stable. °-Continue amlodipine °

## 2023-10-23 NOTE — Progress Notes (Signed)
Subjective:  Patient ID: Jason Walsh, male    DOB: 16-Nov-1961  Age: 62 y.o. MRN: [id number]  CC: Follow-up   HPI:  62 year old male with hypertension, chronic back pain and neck pain, history of skin cancer, tobacco abuse, hyperlipidemia, history of stroke presents for follow-up.  Patient continues to smoke.  He is not interested in cessation.  Patient is amenable to lung cancer screening.  Patient declines colonoscopy.  Declines flu vaccine today.  Patient's blood pressure is stable on amlodipine.  He is not currently taking a baby aspirin.  I advised him to do this given his history of stroke.    He reports that he has gotten some diclofenac and it helps significantly with his pain.  He would like me to prescribe this.  Patient Active Problem List   Diagnosis Date Noted   History of stroke 09/17/2022   History of substance use 09/17/2022   Cancer of skin of face 09/17/2022   Chronic right-sided low back pain with right-sided sciatica 09/17/2022   Essential hypertension 09/17/2022   Hyperlipidemia 01/04/2015   Tobacco abuse 01/03/2015    Social Hx   Social History   Socioeconomic History   Marital status: Legally Separated    Spouse name: Not on file   Number of children: Not on file   Years of education: Not on file   Highest education level: Not on file  Occupational History   Not on file  Tobacco Use   Smoking status: Every Day    Current packs/day: 1.00    Average packs/day: 1 pack/day for 54.7 years (54.7 ttl pk-yrs)    Types: Cigarettes    Start date: 02/02/1969   Smokeless tobacco: Never  Vaping Use   Vaping status: Never Used  Substance and Sexual Activity   Alcohol use: Not Currently    Comment: occasional   Drug use: Yes    Types: Marijuana, Cocaine    Comment: occasional .  no cocaine sine 2016   Sexual activity: Yes    Partners: Female  Other Topics Concern   Not on file  Social History Narrative   Right Handed    Lives in one story home.  Lives alone   Social Determinants of Health   Financial Resource Strain: Low Risk  (01/15/2019)   Overall Financial Resource Strain (CARDIA)    Difficulty of Paying Living Expenses: Not very hard  Food Insecurity: No Food Insecurity (01/15/2019)   Hunger Vital Sign    Worried About Running Out of Food in the Last Year: Never true    Ran Out of Food in the Last Year: Never true  Transportation Needs: No Transportation Needs (01/15/2019)   PRAPARE - Administrator, Civil Service (Medical): No    Lack of Transportation (Non-Medical): No  Physical Activity: Not on file  Stress: No Stress Concern Present (01/15/2019)   [employer] of Occupational Health - Occupational Stress Questionnaire    Feeling of Stress : Only a little  Social Connections: Not on file    Review of Systems Per HPI  Objective:  BP 134/71   Pulse 72   Temp 98.1 F (36.7 C)   Ht 5\' 9"  (1.753 m)   Wt 162 lb 9.6 oz (73.8 kg)   SpO2 98%   BMI 24.01 kg/m      10/23/2023    9:53 AM 10/23/2023    9:27 AM 09/22/2023    1:55 PM  BP/Weight  Systolic BP 134 147 164  Diastolic BP 71 81 83  Wt. (Lbs)  162.6 171.8  BMI  24.01 kg/m2 25.37 kg/m2    Physical Exam Vitals and nursing note reviewed.  Constitutional:      General: He is not in acute distress.    Appearance: Normal appearance.  HENT:     Head: Normocephalic and atraumatic.  Cardiovascular:     Rate and Rhythm: Normal rate and regular rhythm.  Pulmonary:     Effort: Pulmonary effort is normal.     Breath sounds: Normal breath sounds. No wheezing, rhonchi or rales.  Neurological:     Mental Status: He is alert.  Psychiatric:        Mood and Affect: Mood normal.        Behavior: Behavior normal.     Lab Results  Component Value Date   WBC 10.9 (H) 09/17/2022   HGB 14.1 09/17/2022   HCT 42.2 09/17/2022   PLT 314 09/17/2022   GLUCOSE 103 (H) 10/08/2023   CHOL 180 09/17/2022   TRIG 271 (H) 09/17/2022   HDL 42 09/17/2022    LDLCALC 93 09/17/2022   ALT 18 09/17/2022   AST 15 09/17/2022   NA 146 (H) 10/08/2023   K 4.0 10/08/2023   CL 106 10/08/2023   CREATININE 1.24 10/08/2023   BUN 15 10/08/2023   CO2 26 10/08/2023   INR 0.92 01/14/2019   HGBA1C 5.5 09/17/2022     Assessment & Plan:   Problem List Items Addressed This Visit       Cardiovascular and Mediastinum   Essential hypertension - Primary    Stable.  Continue amlodipine.      Relevant Medications   aspirin EC 81 MG tablet   amLODipine (NORVASC) 10 MG tablet     Nervous and Auditory   Chronic right-sided low back pain with right-sided sciatica    Trial of diclofenac.  Advised to use sparingly.      Relevant Medications   aspirin EC 81 MG tablet   diclofenac (VOLTAREN) 75 MG EC tablet     Other   Tobacco abuse    Does not want a quit.  He is amenable to lung cancer screening.  Referral placed.      Relevant Orders   Ambulatory referral to Hematology / Oncology   Hyperlipidemia    He has been started on statin multiple times but is noncompliant regarding this.  Will continue to monitor.  Will continue to revisit in the future.      Relevant Medications   aspirin EC 81 MG tablet   amLODipine (NORVASC) 10 MG tablet    Meds ordered this encounter  Medications   aspirin EC 81 MG tablet    Sig: Take 1 tablet (81 mg total) by mouth daily. Swallow whole.    Dispense:  90 tablet    Refill:  3   diclofenac (VOLTAREN) 75 MG EC tablet    Sig: Take 1 tablet (75 mg total) by mouth 2 (two) times daily as needed for moderate pain (pain score 4-6).    Dispense:  60 tablet    Refill:  1   amLODipine (NORVASC) 10 MG tablet    Sig: Take 1 tablet (10 mg total) by mouth daily.    Dispense:  90 tablet    Refill:  3    Follow-up:  Return in about 6 months (around 04/21/2024).  Everlene Other DO [facility]

## 2023-10-23 NOTE — Assessment & Plan Note (Signed)
Trial of diclofenac.  Advised to use sparingly.

## 2023-10-27 ENCOUNTER — Encounter: Payer: Medicaid Other | Admitting: Physical Medicine and Rehabilitation

## 2023-12-23 ENCOUNTER — Other Ambulatory Visit: Payer: Self-pay

## 2023-12-23 DIAGNOSIS — Z122 Encounter for screening for malignant neoplasm of respiratory organs: Secondary | ICD-10-CM

## 2023-12-23 DIAGNOSIS — Z87891 Personal history of nicotine dependence: Secondary | ICD-10-CM

## 2023-12-23 NOTE — Progress Notes (Signed)
 Received referral for initial lung cancer screening scan. Contacted patient and obtained smoking history (started age 63, current smoker, continuing to smoke 1PPD, 50 pack year) as well as answering questions related to the screening process. Patient denies signs/symptoms of lung cancer such as weight loss or hemoptysis. Patient denies comorbidity that would prevent curative treatment if lung cancer were to be found. Patient is scheduled for shared decision making visit and CT scan on 12/31/2023 at 0845.

## 2023-12-31 ENCOUNTER — Ambulatory Visit (HOSPITAL_COMMUNITY)
Admission: RE | Admit: 2023-12-31 | Discharge: 2023-12-31 | Disposition: A | Discharge status: Home / Self Care | Payer: Medicaid Other | Source: Ambulatory Visit | Attending: Oncology | Admitting: Oncology

## 2023-12-31 ENCOUNTER — Inpatient Hospital Stay: Payer: Medicaid Other | Attending: Oncology | Admitting: Oncology

## 2023-12-31 ENCOUNTER — Encounter: Payer: Self-pay | Admitting: Oncology

## 2023-12-31 DIAGNOSIS — Z720 Tobacco use: Secondary | ICD-10-CM

## 2023-12-31 DIAGNOSIS — Z87891 Personal history of nicotine dependence: Secondary | ICD-10-CM | POA: Insufficient documentation

## 2023-12-31 DIAGNOSIS — Z122 Encounter for screening for malignant neoplasm of respiratory organs: Secondary | ICD-10-CM | POA: Insufficient documentation

## 2023-12-31 NOTE — Progress Notes (Signed)
[facility]  Low-dose CT screening program   I discussed the assessment and treatment plan with the patient. The patient was provided an opportunity to ask questions and all were answered. The patient agreed with the plan and demonstrated an understanding of the instructions.   The patient was advised to call back or seek an in-person evaluation if the symptoms worsen or if the condition fails to improve as anticipated.   In accordance with CMS guidelines, patient has met eligibility criteria including age, absence of signs or symptoms of lung cancer.  Social History   Tobacco Use   Smoking status: Every Day    Current packs/day: 1.00    Average packs/day: 1 pack/day for 54.9 years (54.9 ttl pk-yrs)    Types: Cigarettes    Start date: 02/02/1969   Smokeless tobacco: Never  Vaping Use   Vaping status: Never Used  Substance Use Topics   Alcohol use: Not Currently    Comment: occasional   Drug use: Yes    Types: Marijuana, Cocaine    Comment: occasional .  no cocaine sine 2016      A shared decision-making session was conducted prior to the performance of CT scan. This includes one or more decision aids, includes benefits and harms of screening, follow-up diagnostic testing, over-diagnosis, false positive rate, and total radiation exposure.   Counseling on the importance of adherence to annual lung cancer LDCT screening, impact of co-morbidities, and ability or willingness to undergo diagnosis and treatment is imperative for compliance of the program.   Counseling on the importance of continued smoking cessation for former smokers; the importance of smoking cessation for current smokers, and information about tobacco cessation interventions have been given to patient including [place] [facility] and [facility] quit [place] programs.   Written order for lung cancer screening with LDCT has been given to the patient and any and all questions have been answered to the best of my  abilities.    Yearly follow up will be coordinated by Kennith Gain, RN and lung screening navigator.   I spent 15 minutes dedicated to the care of this patient (face-to-face and non-face-to-face) on the date of the encounter to include what is described in the assessment and plan.  Mauro Kaufmann, NP

## 2023-12-31 NOTE — Patient Instructions (Signed)
You were seen today for your shared decision making visit and a low-dose CT scan for lung cancer screening.    Thank you for your participation in this lifesaving program!  

## 2024-02-02 ENCOUNTER — Encounter: Payer: Self-pay | Admitting: Neurology

## 2024-02-02 ENCOUNTER — Ambulatory Visit: Payer: Medicaid Other | Admitting: Neurology

## 2024-02-02 VITALS — BP 143/88 | HR 83 | Ht 69.0 in | Wt 164.0 lb

## 2024-02-02 DIAGNOSIS — M47812 Spondylosis without myelopathy or radiculopathy, cervical region: Secondary | ICD-10-CM

## 2024-02-02 DIAGNOSIS — M5432 Sciatica, left side: Secondary | ICD-10-CM

## 2024-02-02 MED ORDER — CYCLOBENZAPRINE HCL 10 MG PO TABS: 10.0000 mg | ORAL_TABLET | Freq: Every evening | ORAL | 5 refills | Status: DC | PRN

## 2024-02-02 NOTE — Progress Notes (Signed)
 Follow-up Visit   Date: 02/02/2024    Jason Walsh MRN: [id number] DOB: July 11, 1961    Jason Walsh is a 63 y.o. right-handed Caucasian male with hypertension, hyperlipidemia, tobacco use, and skin cancer returning to the clinic for follow-up of neck pain.  The patient was accompanied to the clinic by self.   IMPRESSION/PLAN: Cervical spondylosis worse at C5-6 where there is spinal canal stenosis and bilateral neural foraminal stenosis (severe) Left sciatica - new  - PT declined - Continue flexeril 10mg  at bedtime as needed   Return to clinic in 6 months  --------------------------------------------- History of present illness: For the past 20 years, he has numbness/tingling in the arms and legs.  He occasionally has pain that radiates from his neck into the arms.  Other times, he has bilateral leg and hip pain.  He feels that symptoms are worse with activity. He took a friends gabapentin which helped with pain.  EMG shows mild sensory neuropathy. MRI lumbar spine shows mild degenerative changes without compressive pathology.  He is referred for further evaluation.     He works as a Music therapist.   UPDATE 03/06/2023:  He was scheduled to have EMG of the arms, but patient reports having new neck pain which started 4 days ago which has greatly limited his range of motion and causing severe neck pain shooting down his arms and back.  He has been sleeping in a recliner for the past several days because he is unable to get back up out of bed due to pain.  He has reduced arm ROM and pain-limiting weakness.  He has been taking gabapentin for pain, but nothing improves it.    Due to severity of pain, EMG was cancelled today and he was seen as a follow-up visit.   UPDATE 07/28/2023:  He is here for follow-up visit.  He reports that neck pain is doing better since taking flexeril 10mg  at bedtime.  He stopped gabapentin because he felt that it was causing worsening shooting pain through his  body.  He works 4-5 hours daily and takes breaks as needed for his neck and low back pain.   UPDATE 02/02/2024:  He is here for follow-up visit.  He had a near slip with his left leg two weeks ago and has achy pain involving the left hip and low back. He has long history of low back pain.  He did PT many years ago and does not think it will help.  He has flexeril 10mg  which he takes for his neck.    Medications:  Current Outpatient Medications on File Prior to Visit  Medication Sig Dispense Refill   amLODipine (NORVASC) 10 MG tablet Take 1 tablet (10 mg total) by mouth daily. 90 tablet 3   aspirin EC 81 MG tablet Take 1 tablet (81 mg total) by mouth daily. Swallow whole. 90 tablet 3   diclofenac (VOLTAREN) 75 MG EC tablet Take 1 tablet (75 mg total) by mouth 2 (two) times daily as needed for moderate pain (pain score 4-6). 60 tablet 1   No current facility-administered medications on file prior to visit.    Allergies:  Allergies  Allergen Reactions   Ibuprofen Other (See Comments) and Hives    Chest pains   Gabapentin     Pain     Vital Signs:  BP (!) 143/88   Pulse 83   Ht 5\' 9"  (1.753 m)   Wt 164 lb (74.4 kg)   SpO2 100%  BMI 24.22 kg/m   Neurological Exam: MENTAL STATUS including orientation to time, place, person, recent and remote memory, attention span and concentration, language, and fund of knowledge is normal.  Speech is not dysarthric, although he tends to mumble  CRANIAL NERVES:   Pupils equal round and reactive to light.  Normal conjugate, extra-ocular eye movements in all directions of gaze.  No ptosis.  Face is symmetric.   MOTOR:  Motor strength is 5/5 in the arms (improved) and 5/5 in the legs.  No atrophy, fasciculations or abnormal movements.  No pronator drift.  Tone is normal.    MSRs:  Reflexes are 2+/4 in the arms and 2+/4 in the legs.   SENSORY:  Intact to vibration throughout.  COORDINATION/GAIT:    Intact rapid alternating movements bilaterally.  Gait appears normal, unassisted.   Data:  MRI cervical spine wo contrast 04/11/2023: 1. Multilevel cervical spondylosis, worst at C5-6, where there is moderate spinal canal stenosis and severe bilateral neural foraminal narrowing. 2. Mild spinal canal stenosis at C3-4, C4-5, and C6-7. 3. Multilevel moderate to severe neural foraminal narrowing, as described above.  MRI lumbar spine wo contrast 12/10/2022: 1. Mild lumbar spine spondylosis as described above. 2. No acute osseous injury of the lumbar spine.   Thank you for allowing me to participate in patient's care.  If I can answer any additional questions, I would be pleased to do so.    Sincerely,    Wahid Holley K. Allena Katz, DO

## 2024-02-18 NOTE — Congregational Nurse Program (Unsigned)
 Jason Walsh made a visit to the soup kitchen, bringing with him a personal journal that contained entries reflecting what seemed to be psychotic thoughts about his life experiences. He requested the staff to read the journal, explaining that he had written its contents over the years. During the discussion, he emphasized the importance of self-forgiveness. When questioned about his mental state, he denied any tendencies towards homicidal or suicidal thoughts and declined to have his blood pressure checked.

## 2024-04-21 ENCOUNTER — Encounter: Payer: Self-pay | Admitting: Family Medicine

## 2024-04-21 ENCOUNTER — Ambulatory Visit (INDEPENDENT_AMBULATORY_CARE_PROVIDER_SITE_OTHER): Payer: Medicaid Other | Admitting: Family Medicine

## 2024-04-21 VITALS — BP 147/79 | HR 62 | Temp 98.4°F | Ht 69.0 in | Wt 158.0 lb

## 2024-04-21 DIAGNOSIS — Z125 Encounter for screening for malignant neoplasm of prostate: Secondary | ICD-10-CM | POA: Diagnosis not present

## 2024-04-21 DIAGNOSIS — E785 Hyperlipidemia, unspecified: Secondary | ICD-10-CM | POA: Diagnosis not present

## 2024-04-21 DIAGNOSIS — I10 Essential (primary) hypertension: Secondary | ICD-10-CM | POA: Diagnosis not present

## 2024-04-21 DIAGNOSIS — D72829 Elevated white blood cell count, unspecified: Secondary | ICD-10-CM | POA: Diagnosis not present

## 2024-04-21 MED ORDER — AMLODIPINE BESYLATE 10 MG PO TABS: 10.0000 mg | ORAL_TABLET | Freq: Every day | ORAL | 3 refills | Status: DC

## 2024-04-21 MED ORDER — ASPIRIN 81 MG PO TBEC: 81.0000 mg | DELAYED_RELEASE_TABLET | Freq: Every day | ORAL | Status: DC

## 2024-04-21 NOTE — Progress Notes (Signed)
 Subjective:  Patient ID: Jason Walsh, male    DOB: 1961-05-14  Age: 63 y.o. MRN: [id number]  CC:   Chief Complaint  Patient presents with   Follow-up     6 month f/u hypertension     HPI:  63 year old with history of stroke, hypertension, hyperlipidemia, tobacco abuse, skin cancer presents for follow-up.  Patient has not gotten back into see dermatology.  He has other areas that need to be addressed.  BP mildly elevated here today.  Patient currently prescribed amlodipine  but reported that he is not taking this medication.  Continues to smoke.  Not interested in cessation.  Patient needs labs.  Not currently on any medication for lipids.  Patient Active Problem List   Diagnosis Date Noted   History of stroke 09/17/2022   History of substance use 09/17/2022   Cancer of skin of face 09/17/2022   Chronic right-sided low back pain with right-sided sciatica 09/17/2022   Essential hypertension 09/17/2022   Hyperlipidemia 01/04/2015   Tobacco abuse 01/03/2015    Social Hx   Social History   Socioeconomic History   Marital status: Legally Separated    Spouse name: Not on file   Number of children: Not on file   Years of education: Not on file   Highest education level: Not on file  Occupational History   Not on file  Tobacco Use   Smoking status: Every Day    Current packs/day: 1.00    Average packs/day: 1 pack/day for 55.2 years (55.2 ttl pk-yrs)    Types: Cigarettes    Start date: 02/02/1969   Smokeless tobacco: Never  Vaping Use   Vaping status: Never Used  Substance and Sexual Activity   Alcohol use: Not Currently    Comment: occasional   Drug use: Yes    Types: Marijuana, Cocaine    Comment: occasional .  no cocaine sine 2016   Sexual activity: Yes    Partners: Female  Other Topics Concern   Not on file  Social History Narrative   Right Handed    Lives in one story home. Lives alone   Social Drivers of Health   Financial Resource Strain: Low Risk   (01/15/2019)   Overall Financial Resource Strain (CARDIA)    Difficulty of Paying Living Expenses: Not very hard  Food Insecurity: No Food Insecurity (01/15/2019)   Hunger Vital Sign    Worried About Running Out of Food in the Last Year: Never true    Ran Out of Food in the Last Year: Never true  Transportation Needs: No Transportation Needs (01/15/2019)   PRAPARE - Administrator, Civil Service (Medical): No    Lack of Transportation (Non-Medical): No  Physical Activity: Not on file  Stress: No Stress Concern Present (01/15/2019)   [employer] of Occupational Health - Occupational Stress Questionnaire    Feeling of Stress : Only a little  Social Connections: Not on file    Review of Systems Per HPI  Objective:  BP (!) 147/79   Pulse 62   Temp 98.4 F (36.9 C)   Ht 5\' 9"  (1.753 m)   Wt 158 lb (71.7 kg)   SpO2 96%   BMI 23.33 kg/m      04/21/2024   11:19 AM 04/21/2024   10:45 AM 02/02/2024    2:33 PM  BP/Weight  Systolic BP 147 152 143  Diastolic BP 79 82 88  Wt. (Lbs)  158 164  BMI  23.33 kg/m2 24.22 kg/m2    Physical Exam Constitutional:      General: He is not in acute distress.    Appearance: Normal appearance.  HENT:     Head: Normocephalic and atraumatic.  Cardiovascular:     Rate and Rhythm: Normal rate and regular rhythm.  Pulmonary:     Effort: Pulmonary effort is normal.     Breath sounds: Normal breath sounds.  Neurological:     Mental Status: He is alert.     Lab Results  Component Value Date   WBC 10.9 (H) 09/17/2022   HGB 14.1 09/17/2022   HCT 42.2 09/17/2022   PLT 314 09/17/2022   GLUCOSE 103 (H) 10/08/2023   CHOL 180 09/17/2022   TRIG 271 (H) 09/17/2022   HDL 42 09/17/2022   LDLCALC 93 09/17/2022   ALT 18 09/17/2022   AST 15 09/17/2022   NA 146 (H) 10/08/2023   K 4.0 10/08/2023   CL 106 10/08/2023   CREATININE 1.24 10/08/2023   BUN 15 10/08/2023   CO2 26 10/08/2023   INR 0.92 01/14/2019   HGBA1C 5.5 09/17/2022      Assessment & Plan:  Essential hypertension Assessment & Plan: Mildly elevated here today.  Will continue to monitor.  Needs to take amlodipine .  Orders: -     CMP14+EGFR  Leukocytosis, unspecified type -     CBC  Hyperlipidemia, unspecified hyperlipidemia type Assessment & Plan: Lipid panel today to reassess.  Orders: -     Lipid panel  Screening PSA (prostate specific antigen) -     PSA  Other orders -     amLODIPine  Besylate; Take 1 tablet (10 mg total) by mouth daily.  Dispense: 90 tablet; Refill: 3 -     Aspirin ; Take 1 tablet (81 mg total) by mouth daily. Swallow whole.    Follow-up: 6 months  Davis Ambrosini Debrah Fan DO [facility]

## 2024-04-21 NOTE — Patient Instructions (Signed)
 Labs today.   [facility] - Dermatology Surgery  [address]  [place], [place] [address]   Doerfler, Margherita Shell, MD  [address]  [place], [place] [address]  [phone] (Work)   Call Dermatology.  Follow up in 6 months.  Take care  Dr. Debrah Fan

## 2024-04-21 NOTE — Assessment & Plan Note (Addendum)
 Mildly elevated here today.  Will continue to monitor.  Needs to take amlodipine .

## 2024-04-21 NOTE — Assessment & Plan Note (Signed)
Lipid panel today to reassess. 

## 2024-04-22 DIAGNOSIS — E785 Hyperlipidemia, unspecified: Secondary | ICD-10-CM | POA: Diagnosis not present

## 2024-04-22 DIAGNOSIS — Z125 Encounter for screening for malignant neoplasm of prostate: Secondary | ICD-10-CM | POA: Diagnosis not present

## 2024-04-22 DIAGNOSIS — I10 Essential (primary) hypertension: Secondary | ICD-10-CM | POA: Diagnosis not present

## 2024-04-22 DIAGNOSIS — D72829 Elevated white blood cell count, unspecified: Secondary | ICD-10-CM | POA: Diagnosis not present

## 2024-04-23 LAB — CMP14+EGFR
ALT: 13 IU/L (ref 0–44)
AST: 17 IU/L (ref 0–40)
Albumin: 4 g/dL (ref 3.9–4.9)
Alkaline Phosphatase: 88 IU/L (ref 44–121)
BUN/Creatinine Ratio: 18 (ref 10–24)
BUN: 22 mg/dL (ref 8–27)
Bilirubin Total: 0.3 mg/dL (ref 0.0–1.2)
CO2: 24 mmol/L (ref 20–29)
Calcium: 8.5 mg/dL — ABNORMAL LOW (ref 8.6–10.2)
Chloride: 107 mmol/L — ABNORMAL HIGH (ref 96–106)
Creatinine, Ser: 1.19 mg/dL (ref 0.76–1.27)
Globulin, Total: 1.6 g/dL (ref 1.5–4.5)
Glucose: 118 mg/dL — ABNORMAL HIGH (ref 70–99)
Potassium: 3.6 mmol/L (ref 3.5–5.2)
Sodium: 144 mmol/L (ref 134–144)
Total Protein: 5.6 g/dL — ABNORMAL LOW (ref 6.0–8.5)
eGFR: 68 mL/min/{1.73_m2} (ref >59)

## 2024-04-23 LAB — CBC
Hematocrit: 40.4 % (ref 37.5–51.0)
Hemoglobin: 14 g/dL (ref 13.0–17.7)
MCH: 34.4 pg — ABNORMAL HIGH (ref 26.6–33.0)
MCHC: 34.7 g/dL (ref 31.5–35.7)
MCV: 99 fL — ABNORMAL HIGH (ref 79–97)
Platelets: 304 10*3/uL (ref 150–450)
RBC: 4.07 x10E6/uL — ABNORMAL LOW (ref 4.14–5.80)
RDW: 13.5 % (ref 11.6–15.4)
WBC: 8.5 10*3/uL (ref 3.4–10.8)

## 2024-04-23 LAB — LIPID PANEL
Chol/HDL Ratio: 4.3 ratio (ref 0.0–5.0)
Cholesterol, Total: 164 mg/dL (ref 100–199)
HDL: 38 mg/dL — ABNORMAL LOW (ref >39)
LDL Chol Calc (NIH): 104 mg/dL — ABNORMAL HIGH (ref 0–99)
Triglycerides: 122 mg/dL (ref 0–149)
VLDL Cholesterol Cal: 22 mg/dL (ref 5–40)

## 2024-04-23 LAB — PSA: Prostate Specific Ag, Serum: 1.8 ng/mL (ref 0.0–4.0)

## 2024-06-12 ENCOUNTER — Emergency Department (HOSPITAL_COMMUNITY)

## 2024-06-12 ENCOUNTER — Encounter (HOSPITAL_COMMUNITY): Payer: Self-pay | Admitting: Emergency Medicine

## 2024-06-12 ENCOUNTER — Other Ambulatory Visit: Payer: Self-pay

## 2024-06-12 ENCOUNTER — Emergency Department (HOSPITAL_COMMUNITY)
Admission: EM | Admit: 2024-06-12 | Discharge: 2024-06-12 | Disposition: A | Discharge status: Home / Self Care | Attending: Emergency Medicine | Admitting: Emergency Medicine

## 2024-06-12 DIAGNOSIS — S60450A Superficial foreign body of right index finger, initial encounter: Secondary | ICD-10-CM | POA: Diagnosis not present

## 2024-06-12 DIAGNOSIS — I10 Essential (primary) hypertension: Secondary | ICD-10-CM | POA: Insufficient documentation

## 2024-06-12 DIAGNOSIS — Z7982 Long term (current) use of aspirin: Secondary | ICD-10-CM | POA: Insufficient documentation

## 2024-06-12 DIAGNOSIS — W312XXA Contact with powered woodworking and forming machines, initial encounter: Secondary | ICD-10-CM | POA: Diagnosis not present

## 2024-06-12 DIAGNOSIS — Z85828 Personal history of other malignant neoplasm of skin: Secondary | ICD-10-CM | POA: Insufficient documentation

## 2024-06-12 DIAGNOSIS — Z23 Encounter for immunization: Secondary | ICD-10-CM | POA: Insufficient documentation

## 2024-06-12 DIAGNOSIS — Z8673 Personal history of transient ischemic attack (TIA), and cerebral infarction without residual deficits: Secondary | ICD-10-CM | POA: Diagnosis not present

## 2024-06-12 DIAGNOSIS — S61211A Laceration without foreign body of left index finger without damage to nail, initial encounter: Secondary | ICD-10-CM | POA: Diagnosis not present

## 2024-06-12 DIAGNOSIS — Z79899 Other long term (current) drug therapy: Secondary | ICD-10-CM | POA: Diagnosis not present

## 2024-06-12 DIAGNOSIS — S61210A Laceration without foreign body of right index finger without damage to nail, initial encounter: Secondary | ICD-10-CM | POA: Diagnosis not present

## 2024-06-12 MED ORDER — TETANUS-DIPHTH-ACELL PERTUSSIS 5-2.5-18.5 LF-MCG/0.5 IM SUSY
0.5000 mL | PREFILLED_SYRINGE | Freq: Once | INTRAMUSCULAR | Status: AC
  Administered 2024-06-12: 0.5 mL via INTRAMUSCULAR
  Filled 2024-06-12: qty 0.5

## 2024-06-12 MED ORDER — ACETAMINOPHEN 500 MG PO TABS
1000.0000 mg | ORAL_TABLET | Freq: Once | ORAL | Status: AC
  Administered 2024-06-12: 1000 mg via ORAL
  Filled 2024-06-12: qty 2

## 2024-06-12 MED ORDER — LIDOCAINE HCL (PF) 1 % IJ SOLN
5.0000 mL | Freq: Once | INTRAMUSCULAR | Status: AC
  Administered 2024-06-12: 5 mL
  Filled 2024-06-12: qty 5

## 2024-06-12 NOTE — Discharge Instructions (Signed)
 Thank you for coming to [facility] Emergency Department. You were seen for hand laceration. We did an exam, and imaging, and these showed a 6 cm laceration to your left index finger. We placed 9 sutures and updated your tetanus shot. You can alternate taking Tylenol  and ibuprofen  as needed for pain. You can take 650mg  tylenol  (acetaminophen ) every 4-6 hours, and 600 mg ibuprofen  3 times a day. These sutures need to be removed in 7 days.   Please keep the wound clean and dry.  You can use antibiotic ointment twice per day.   Please follow-up with hand surgery center with Dr. Linnie Rogue in 1 week for wound check and suture removal.  Do not hesitate to return to the ED or call 911 if you experience: -Worsening symptoms - Cold blue numb finger --Signs of infection including redness, swelling, pus drainage, increased pain, or fevers/chills -Anything else that concerns you

## 2024-06-12 NOTE — ED Provider Notes (Signed)
 [place] EMERGENCY DEPARTMENT AT [facility] Provider Note   CSN: [id number] Arrival date & time: 06/12/24  2027     History {Add pertinent medical, surgical, social history, OB history to HPI:1} Chief Complaint  Patient presents with   Laceration    Jason Walsh is a 63 y.o. male with PMH as listed below who presents with laceration to L index finger d/t table saw. No other injuries. Unknown last Tdap. Has full sensation in digit.    Past Medical History:  Diagnosis Date   Cancer (HCC) 2010   skin   Lumbar disc disorder    Stroke [facility]) 2016       Home Medications Prior to Admission medications   Medication Sig Start Date End Date Taking? Authorizing Provider  amLODipine  (NORVASC ) 10 MG tablet Take 1 tablet (10 mg total) by mouth daily. 04/21/24   Cook, [name], DO  aspirin  EC 81 MG tablet Take 1 tablet (81 mg total) by mouth daily. Swallow whole. 04/21/24   Cook, [name], DO  diclofenac  (VOLTAREN ) 75 MG EC tablet Take 1 tablet (75 mg total) by mouth 2 (two) times daily as needed for moderate pain (pain score 4-6). 10/23/23   Cook, [name], DO      Allergies    Ibuprofen  and Gabapentin     Review of Systems   Review of Systems A 10 point review of systems was performed and is negative unless otherwise reported in HPI.  Physical Exam Updated Vital Signs BP (!) 187/88 (BP Location: Right Arm)   Pulse 80   Temp 97.9 F (36.6 C) (Oral)   Resp 16   Ht 5' 9 (1.753 m)   Wt 71.7 kg   SpO2 97%   BMI 23.33 kg/m  Physical Exam General: Normal appearing male, lying in bed.  HEENT: PERRLA, Sclera anicteric, MMM, trachea midline.  Cardiology: RRR, no murmurs/rubs/gallops. BL radial and DP pulses equal bilaterally.  Resp: Normal respiratory rate and effort. CTAB, no wheezes, rhonchi, crackles.  Abd: Soft, non-tender, non-distended. No rebound tenderness or guarding.  GU: Deferred. MSK: No peripheral edema or signs of trauma. Extremities without deformity or  TTP. No cyanosis or clubbing. Skin: warm, dry. No rashes or lesions. Back: No CVA tenderness Neuro: A&Ox4, CNs II-XII grossly intact. MAEs. Sensation grossly intact.  Psych: Normal mood and affect.   ED Results / Procedures / Treatments   Labs (all labs ordered are listed, but only abnormal results are displayed) Labs Reviewed - No data to display  EKG None  Radiology No results found.  Procedures Procedures  {Document cardiac monitor, telemetry assessment procedure when appropriate:1}  Medications Ordered in ED Medications  Tdap (BOOSTRIX ) injection 0.5 mL (has no administration in time range)  lidocaine  (PF) (XYLOCAINE ) 1 % injection 5 mL (has no administration in time range)  acetaminophen  (TYLENOL ) tablet 1,000 mg (has no administration in time range)    ED Course/ Medical Decision Making/ A&P                          Medical Decision Making Amount and/or Complexity of Data Reviewed Radiology: ordered.  Risk OTC drugs. Prescription drug management.    This patient presents to the ED for concern of ***, this involves an extensive number of treatment options, and is a complaint that carries with it a high risk of complications and morbidity.  I considered the following differential and admission for this acute, potentially life threatening condition.  MDM:    Patient presents with laceration to ***.   Laceration was cleaned with copious irrigation and repaired.  No retained foreign body was found. ***Given proxmity of laceration to potential tendon, every effort made to evaluate for possible neuro-vascular-tendon injury.  Motor-neuro-vascular exam intact prior to procedure No evidence of tendon injury on exploration of wound. Motor-neuro-vascular exam intact after procedure  Tetanus: *** UTD -not UTD and administered  ***Radiographs not indicated given no hx c/f retained foreign body and wound exploration performed for FB. -Radiographs obtained and no  radio-opaque foreign body seen, no fracture seen   ***Antibiotics _ given no underlying fracture, routine abx not indicated _if underlying fracture, presumed to be open fracture and as such abx administered based upon Gustillo-Anderson grading: Grade I: Wound <1cm, Little soft tissue injury or crush injury, Moderately clean puncture site, Infection risk 0-12% Grade II: Laceration >1cm, No extensive soft tissue damage, but slight or moderate crush injury, Moderate contamination, Infection risk 2-12% Grade III: Extensive damage to soft tissue, including neurovascular structures and muscle, High degree of contamination, Infection risk 5-50% Further subcategorized: III A: Fracture covered by soft tissue (Infection risk 5-10%) III B: Loss of soft tissue and evidence of bone stripping (Infection risk 10-50%) III C: Any fracture with an associated arterial injury that requires surgical repair (Infection risk 25-50%) Additional Considerations: Fracture with non-communicating overlying wound, Additional sites of injury found in 40-80% of cases, Nerve, vascular, muscular, and/or ligamentous injury  Grade I Fracture: Cefazolin (Ancef) 2g IV three times daily Ciprofloxacin 400mg  IV BID (avoid in pediatrics) Grade II/III Fracture Options[edit] Add Gentamicin 300 mg (1-1.7mg /kg) IV to any of the Grade I regemins If concern for Clostridium then consider single drug regimen of Pipericillin/Tazobactam 4.5g (80mg /kg) IV three times daily    COUNSELING: Patient is advised on follow up timing for suture removal, to be removed by healthcare professional in ***.   I discussed the possibility of residual foreign body with patient and that no matter how thorough the search it is still a possibility. I explained to return if patient notices signs of retained FB. I also explained what to look for with regards to infection. The patient agreed to return with any purulent drainage, extending erythema or pain, fever,  nausea/vomiting or any other changes.  I also discussed the inevitability of scarring with the patient. They understand that all lacerations will leave a varying degree of scarring and optimal outcome/cosmetic appearance can never be guaranteed. They also understand the possibility of prompt revision by plastic surgery if desired.      Imaging Studies ordered: I ordered imaging studies including finger XR I independently visualized and interpreted imaging. I agree with the radiologist interpretation  Additional history obtained from chart review  Reevaluation: After the interventions noted above, I reevaluated the patient and found that they have :{resolved/improved/worsened:23923::improved}  Social Determinants of Health: Lives independently  Disposition:  ***  Co morbidities that complicate the patient evaluation  Past Medical History:  Diagnosis Date   Cancer (HCC) 2010   skin   Lumbar disc disorder    Stroke (HCC) 2016     Medicines Meds ordered this encounter  Medications   Tdap (BOOSTRIX ) injection 0.5 mL   lidocaine  (PF) (XYLOCAINE ) 1 % injection 5 mL   acetaminophen  (TYLENOL ) tablet 1,000 mg    I have reviewed the patients home medicines and have made adjustments as needed  Problem List / ED Course: Problem List Items Addressed This Visit   None        {  Document critical care time when appropriate:1} {Document review of labs and clinical decision tools ie heart score, Chads2Vasc2 etc:1}  {Document your independent review of radiology images, and any outside records:1} {Document your discussion with family members, caretakers, and with consultants:1} {Document social determinants of health affecting pt's care:1} {Document your decision making why or why not admission, treatments were needed:1}  This note was created using dictation software, which may contain spelling or grammatical errors.

## 2024-06-12 NOTE — ED Triage Notes (Signed)
 Patient coming to ED for evaluation of laceration to L 2nd finger.  Reports he cut finger with a table saw today.  Bleeding controlled at this time.  Sensation intact.

## 2024-06-12 NOTE — ED Notes (Signed)
 Pt/family received d/c paperwork at this time. After going over the paperwork any questions, comments, or concerns were answered to the best of this nurse's knowledge. The pt/family verbally acknowledged the teachings/instructions.

## 2024-06-21 ENCOUNTER — Encounter (HOSPITAL_COMMUNITY): Payer: Self-pay

## 2024-06-21 ENCOUNTER — Inpatient Hospital Stay (HOSPITAL_COMMUNITY)
Admission: EM | Admit: 2024-06-21 | Discharge: 2024-06-24 | DRG: 603 | Disposition: A | Discharge status: Home / Self Care | Attending: Family Medicine | Admitting: Family Medicine

## 2024-06-21 ENCOUNTER — Other Ambulatory Visit: Payer: Self-pay

## 2024-06-21 ENCOUNTER — Emergency Department (HOSPITAL_COMMUNITY)

## 2024-06-21 ENCOUNTER — Ambulatory Visit: Admission: EM | Admit: 2024-06-21 | Discharge: 2024-06-21 | Discharge status: Against Medical Advice

## 2024-06-21 ENCOUNTER — Encounter: Payer: Self-pay | Admitting: Emergency Medicine

## 2024-06-21 DIAGNOSIS — L0390 Cellulitis, unspecified: Secondary | ICD-10-CM | POA: Diagnosis present

## 2024-06-21 DIAGNOSIS — B9562 Methicillin resistant Staphylococcus aureus infection as the cause of diseases classified elsewhere: Secondary | ICD-10-CM | POA: Diagnosis present

## 2024-06-21 DIAGNOSIS — E785 Hyperlipidemia, unspecified: Secondary | ICD-10-CM | POA: Diagnosis present

## 2024-06-21 DIAGNOSIS — Z886 Allergy status to analgesic agent status: Secondary | ICD-10-CM

## 2024-06-21 DIAGNOSIS — L03012 Cellulitis of left finger: Secondary | ICD-10-CM | POA: Diagnosis not present

## 2024-06-21 DIAGNOSIS — N179 Acute kidney failure, unspecified: Secondary | ICD-10-CM | POA: Diagnosis present

## 2024-06-21 DIAGNOSIS — Z85828 Personal history of other malignant neoplasm of skin: Secondary | ICD-10-CM

## 2024-06-21 DIAGNOSIS — Z8249 Family history of ischemic heart disease and other diseases of the circulatory system: Secondary | ICD-10-CM

## 2024-06-21 DIAGNOSIS — Z79899 Other long term (current) drug therapy: Secondary | ICD-10-CM

## 2024-06-21 DIAGNOSIS — Z807 Family history of other malignant neoplasms of lymphoid, hematopoietic and related tissues: Secondary | ICD-10-CM

## 2024-06-21 DIAGNOSIS — S61321A Laceration with foreign body of left index finger with damage to nail, initial encounter: Secondary | ICD-10-CM

## 2024-06-21 DIAGNOSIS — Z91148 Patient's other noncompliance with medication regimen for other reason: Secondary | ICD-10-CM

## 2024-06-21 DIAGNOSIS — S61211A Laceration without foreign body of left index finger without damage to nail, initial encounter: Secondary | ICD-10-CM | POA: Diagnosis present

## 2024-06-21 DIAGNOSIS — E876 Hypokalemia: Secondary | ICD-10-CM | POA: Diagnosis present

## 2024-06-21 DIAGNOSIS — W312XXA Contact with powered woodworking and forming machines, initial encounter: Secondary | ICD-10-CM

## 2024-06-21 DIAGNOSIS — Z720 Tobacco use: Secondary | ICD-10-CM | POA: Diagnosis present

## 2024-06-21 DIAGNOSIS — I96 Gangrene, not elsewhere classified: Secondary | ICD-10-CM | POA: Diagnosis present

## 2024-06-21 DIAGNOSIS — M65942 Unspecified synovitis and tenosynovitis, left hand: Secondary | ICD-10-CM | POA: Diagnosis present

## 2024-06-21 DIAGNOSIS — Z7982 Long term (current) use of aspirin: Secondary | ICD-10-CM

## 2024-06-21 DIAGNOSIS — S61201A Unspecified open wound of left index finger without damage to nail, initial encounter: Secondary | ICD-10-CM | POA: Diagnosis not present

## 2024-06-21 DIAGNOSIS — Z888 Allergy status to other drugs, medicaments and biological substances status: Secondary | ICD-10-CM

## 2024-06-21 DIAGNOSIS — Z8673 Personal history of transient ischemic attack (TIA), and cerebral infarction without residual deficits: Secondary | ICD-10-CM

## 2024-06-21 DIAGNOSIS — F17210 Nicotine dependence, cigarettes, uncomplicated: Secondary | ICD-10-CM | POA: Diagnosis present

## 2024-06-21 DIAGNOSIS — S61219A Laceration without foreign body of unspecified finger without damage to nail, initial encounter: Secondary | ICD-10-CM | POA: Diagnosis not present

## 2024-06-21 DIAGNOSIS — Z820 Family history of epilepsy and other diseases of the nervous system: Secondary | ICD-10-CM

## 2024-06-21 DIAGNOSIS — I10 Essential (primary) hypertension: Secondary | ICD-10-CM | POA: Diagnosis present

## 2024-06-21 LAB — BASIC METABOLIC PANEL WITH GFR
Anion gap: 14 (ref 5–15)
BUN: 26 mg/dL — ABNORMAL HIGH (ref 8–23)
CO2: 27 mmol/L (ref 22–32)
Calcium: 8.6 mg/dL — ABNORMAL LOW (ref 8.9–10.3)
Chloride: 99 mmol/L (ref 98–111)
Creatinine, Ser: 1.4 mg/dL — ABNORMAL HIGH (ref 0.61–1.24)
GFR, Estimated: 56 mL/min — ABNORMAL LOW (ref >60)
Glucose, Bld: 85 mg/dL (ref 70–99)
Potassium: 3.3 mmol/L — ABNORMAL LOW (ref 3.5–5.1)
Sodium: 140 mmol/L (ref 135–145)

## 2024-06-21 LAB — CBC WITH DIFFERENTIAL/PLATELET
Abs Immature Granulocytes: 0.03 K/uL (ref 0.00–0.07)
Basophils Absolute: 0 K/uL (ref 0.0–0.1)
Basophils Relative: 0 %
Eosinophils Absolute: 0 K/uL (ref 0.0–0.5)
Eosinophils Relative: 0 %
HCT: 40.7 % (ref 39.0–52.0)
Hemoglobin: 13.5 g/dL (ref 13.0–17.0)
Immature Granulocytes: 0 %
Lymphocytes Relative: 11 %
Lymphs Abs: 1.2 K/uL (ref 0.7–4.0)
MCH: 33.3 pg (ref 26.0–34.0)
MCHC: 33.2 g/dL (ref 30.0–36.0)
MCV: 100.5 fL — ABNORMAL HIGH (ref 80.0–100.0)
Monocytes Absolute: 0.9 K/uL (ref 0.1–1.0)
Monocytes Relative: 8 %
Neutro Abs: 9.2 K/uL — ABNORMAL HIGH (ref 1.7–7.7)
Neutrophils Relative %: 81 %
Platelets: 289 K/uL (ref 150–400)
RBC: 4.05 MIL/uL — ABNORMAL LOW (ref 4.22–5.81)
RDW: 12.4 % (ref 11.5–15.5)
WBC: 11.4 K/uL — ABNORMAL HIGH (ref 4.0–10.5)
nRBC: 0 % (ref 0.0–0.2)

## 2024-06-21 LAB — MAGNESIUM: Magnesium: 2.5 mg/dL — ABNORMAL HIGH (ref 1.7–2.4)

## 2024-06-21 LAB — PHOSPHORUS: Phosphorus: 3.2 mg/dL (ref 2.5–4.6)

## 2024-06-21 MED ORDER — HYDROMORPHONE HCL 1 MG/ML IJ SOLN
0.5000 mg | INTRAMUSCULAR | Status: DC | PRN
  Filled 2024-06-21: qty 1

## 2024-06-21 MED ORDER — LINEZOLID 600 MG/300ML IV SOLN
600.0000 mg | Freq: Two times a day (BID) | INTRAVENOUS | Status: DC
  Administered 2024-06-21 – 2024-06-24 (×6): 600 mg via INTRAVENOUS
  Filled 2024-06-21 (×8): qty 300

## 2024-06-21 MED ORDER — SODIUM CHLORIDE 0.9 % IV SOLN
3.0000 g | Freq: Once | INTRAVENOUS | Status: AC
  Administered 2024-06-21: 3 g via INTRAVENOUS
  Filled 2024-06-21: qty 8

## 2024-06-21 MED ORDER — ONDANSETRON HCL 4 MG PO TABS: 4.0000 mg | ORAL_TABLET | Freq: Four times a day (QID) | ORAL | Status: DC | PRN

## 2024-06-21 MED ORDER — PIPERACILLIN-TAZOBACTAM 3.375 G IVPB 30 MIN
3.3750 g | Freq: Once | INTRAVENOUS | Status: AC
  Administered 2024-06-21: 3.375 g via INTRAVENOUS
  Filled 2024-06-21: qty 50

## 2024-06-21 MED ORDER — ACETAMINOPHEN 650 MG RE SUPP: 650.0000 mg | Freq: Four times a day (QID) | RECTAL | Status: DC | PRN

## 2024-06-21 MED ORDER — ZOLPIDEM TARTRATE 5 MG PO TABS: 5.0000 mg | ORAL_TABLET | Freq: Every evening | ORAL | Status: DC | PRN

## 2024-06-21 MED ORDER — FLEET ENEMA RE ENEM: 1.0000 | ENEMA | Freq: Once | RECTAL | Status: DC | PRN

## 2024-06-21 MED ORDER — ASPIRIN 81 MG PO TBEC
81.0000 mg | DELAYED_RELEASE_TABLET | Freq: Every day | ORAL | Status: DC
  Administered 2024-06-22 – 2024-06-24 (×3): 81 mg via ORAL
  Filled 2024-06-21 (×3): qty 1

## 2024-06-21 MED ORDER — SODIUM CHLORIDE 0.9% FLUSH
3.0000 mL | Freq: Two times a day (BID) | INTRAVENOUS | Status: DC
  Administered 2024-06-22 – 2024-06-23 (×3): 3 mL via INTRAVENOUS

## 2024-06-21 MED ORDER — BISACODYL 5 MG PO TBEC: 5.0000 mg | DELAYED_RELEASE_TABLET | Freq: Every day | ORAL | Status: DC | PRN

## 2024-06-21 MED ORDER — ACETAMINOPHEN 325 MG PO TABS: 650.0000 mg | ORAL_TABLET | Freq: Four times a day (QID) | ORAL | Status: DC | PRN

## 2024-06-21 MED ORDER — IPRATROPIUM BROMIDE 0.02 % IN SOLN: 0.5000 mg | Freq: Four times a day (QID) | RESPIRATORY_TRACT | Status: DC | PRN

## 2024-06-21 MED ORDER — SODIUM CHLORIDE 0.9 % IV SOLN
INTRAVENOUS | Status: DC
Start: 2024-06-21 — End: 2024-06-21

## 2024-06-21 MED ORDER — SODIUM CHLORIDE 0.9 % IV SOLN: INTRAVENOUS | Status: DC

## 2024-06-21 MED ORDER — HYDRALAZINE HCL 20 MG/ML IJ SOLN
10.0000 mg | INTRAMUSCULAR | Status: DC | PRN
  Administered 2024-06-21: 10 mg via INTRAVENOUS
  Filled 2024-06-21: qty 1

## 2024-06-21 MED ORDER — AMLODIPINE BESYLATE 5 MG PO TABS
10.0000 mg | ORAL_TABLET | Freq: Every day | ORAL | Status: DC
  Administered 2024-06-22 – 2024-06-24 (×3): 10 mg via ORAL
  Filled 2024-06-21 (×3): qty 2

## 2024-06-21 MED ORDER — HYDROMORPHONE HCL 1 MG/ML IJ SOLN
1.0000 mg | INTRAMUSCULAR | Status: DC | PRN
  Administered 2024-06-21 – 2024-06-22 (×4): 1 mg via INTRAVENOUS
  Filled 2024-06-21 (×3): qty 1

## 2024-06-21 MED ORDER — OXYCODONE HCL 5 MG PO TABS
5.0000 mg | ORAL_TABLET | ORAL | Status: DC | PRN
  Administered 2024-06-21 – 2024-06-22 (×2): 5 mg via ORAL
  Filled 2024-06-21 (×2): qty 1

## 2024-06-21 MED ORDER — POTASSIUM CHLORIDE 2 MEQ/ML IV SOLN
INTRAVENOUS | Status: DC
  Filled 2024-06-21: qty 1000

## 2024-06-21 MED ORDER — PIPERACILLIN-TAZOBACTAM 3.375 G IVPB 30 MIN: 3.3750 g | Freq: Once | INTRAVENOUS | Status: DC

## 2024-06-21 MED ORDER — POTASSIUM CHLORIDE 10 MEQ/100ML IV SOLN
10.0000 meq | INTRAVENOUS | Status: AC
  Administered 2024-06-21 – 2024-06-22 (×2): 10 meq via INTRAVENOUS
  Filled 2024-06-21 (×2): qty 100

## 2024-06-21 MED ORDER — PIPERACILLIN-TAZOBACTAM 3.375 G IVPB
3.3750 g | Freq: Three times a day (TID) | INTRAVENOUS | Status: DC
  Administered 2024-06-22 – 2024-06-24 (×8): 3.375 g via INTRAVENOUS
  Filled 2024-06-21 (×7): qty 50

## 2024-06-21 MED ORDER — SODIUM CHLORIDE 0.9% FLUSH
3.0000 mL | Freq: Two times a day (BID) | INTRAVENOUS | Status: DC
  Administered 2024-06-22 – 2024-06-24 (×4): 3 mL via INTRAVENOUS

## 2024-06-21 MED ORDER — ONDANSETRON HCL 4 MG/2ML IJ SOLN: 4.0000 mg | Freq: Four times a day (QID) | INTRAMUSCULAR | Status: DC | PRN

## 2024-06-21 MED ORDER — LACTATED RINGERS IV SOLN: INTRAVENOUS | Status: AC

## 2024-06-21 MED ORDER — HEPARIN SODIUM (PORCINE) 5000 UNIT/ML IJ SOLN
5000.0000 [IU] | Freq: Three times a day (TID) | INTRAMUSCULAR | Status: DC
  Administered 2024-06-21 – 2024-06-24 (×7): 5000 [IU] via SUBCUTANEOUS
  Filled 2024-06-21 (×7): qty 1

## 2024-06-21 MED ORDER — LEVALBUTEROL HCL 0.63 MG/3ML IN NEBU: 0.6300 mg | INHALATION_SOLUTION | Freq: Four times a day (QID) | RESPIRATORY_TRACT | Status: DC | PRN

## 2024-06-21 MED ORDER — SENNOSIDES-DOCUSATE SODIUM 8.6-50 MG PO TABS: 1.0000 | ORAL_TABLET | Freq: Every evening | ORAL | Status: DC | PRN

## 2024-06-21 NOTE — H&P (Signed)
 History and Physical   Patient: Jason Walsh                            PCP: Cook, [name], DO                    DOB: 1961-02-13            DOA: 06/21/2024 FMW:[id number]             DOS: 06/21/2024, 4:45 PM  Cook, [name], DO  Patient coming from:   HOME  I have personally reviewed patient's medical records, in electronic medical records, including:  [facility], and care everywhere.    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  About 9 days ago on July 5 he had a accidental laceration with tablesaw to the dorsal aspect of the finger.  At the time laceration was sutured dressed..  Now is progressively worsening with edema erythema was extending to his 2nd and 3rd digit onto his palm and hand area.  Third index pending there is edematous unable to flex or extend due to severe pain.  Second digit the wound is opened up and draining. Denies having any fever.   ED course: Blood pressure (!) 150/76, pulse 64, temperature 98 F (36.7 C), resp. rate 18, height 5' 9 (1.753 m), weight 71.7 kg, SpO2 100%.  Labs reviewed-within normal limits with exception of WBC of 11.4, potassium 3.3, BUN 26, creatinine 1.4, calcium  8.6  Patient was given IV antibiotics Unasyn  EDP Dr. Cleotilde has discussed the case with Dr. Margrette orthopedic with plan to evaluate for possible OR I&D.        Patient Denies having: Fever, Chills, Cough, SOB, Chest Pain, Abd pain, N/V/D, headache, dizziness, lightheadedness,  Dysuria, Joint pain, rash, open wounds  ED Course:   Blood pressure (!) 150/76, pulse 64, temperature 98 F (36.7 C), resp. rate 18, height 5' 9 (1.753 m), weight 71.7 kg, SpO2 100%. Abnormal labs;   Review of  Systems: As per HPI, otherwise 10 point review of systems were negative.   ----------------------------------------------------------------------------------------------------------------------  Allergies  Allergen Reactions   Ibuprofen  Other (See Comments) and Hives    Chest pains   Gabapentin      Pain     Home MEDs:  Prior to Admission medications   Medication Sig Start Date End Date Taking? Authorizing Provider  amLODipine  (NORVASC ) 10 MG tablet Take 1 tablet (10 mg total) by mouth daily. 04/21/24   Cook, [name], DO  aspirin  EC 81 MG tablet Take 1 tablet (81 mg total) by mouth daily. Swallow whole. 04/21/24   Cook, [name], DO  diclofenac  (VOLTAREN ) 75 MG EC tablet Take 1 tablet (75 mg total) by mouth 2 (two) times daily as needed for moderate pain (pain score 4-6). 10/23/23   Cook, [name], DO    PRN MEDs: acetaminophen  **OR** acetaminophen , bisacodyl , hydrALAZINE , HYDROmorphone  (DILAUDID ) injection, ipratropium, levalbuterol , ondansetron  **OR** ondansetron  (ZOFRAN ) IV, oxyCODONE , senna-docusate, sodium phosphate , zolpidem   Past Medical History:  Diagnosis Date   Cancer (HCC) 2010   skin   Lumbar disc disorder  Stroke [facility]) 2016    Past Surgical History:  Procedure Laterality Date   FINGER SURGERY     L index and pinky   IRRIGATION AND DEBRIDEMENT KNEE Right 07/10/2017   Procedure: RIGHT KNEE SCOPE IRRIGATION AND DEBRIDEMENT;  Surgeon: Gerome Charleston, MD;  Location: [facility] ORS;  Service: Orthopedics;  Laterality: Right;   MENISCUS REPAIR  09/09/2017   NOSE SURGERY     skin cancer   SKIN CANCER EXCISION Bilateral    basal cell     reports that he has been smoking cigarettes. He started smoking about 55 years ago. He has a 55.4 pack-year smoking history. He has never used smokeless tobacco. He reports that he does not currently use alcohol. He reports current drug use. Drugs: Marijuana and Cocaine.   Family History  Problem Relation Age of Onset   Cancer Mother         lymphoma   Hypertension Mother    Parkinson's disease Father    Alzheimer's disease Father     Physical Exam:   Vitals:   06/21/24 1419 06/21/24 1421  BP: (!) 150/76   Pulse: 64   Resp: 18   Temp: 98 F (36.7 C)   SpO2: 100%   Weight:  71.7 kg  Height:  5' 9 (1.753 m)   Constitutional: NAD, calm, comfortable Eyes: PERRL, lids and conjunctivae normal ENMT: Mucous membranes are moist. Posterior pharynx clear of any exudate or lesions.Normal dentition.  Neck: normal, supple, no masses, no thyromegaly Respiratory: clear to auscultation bilaterally, no wheezing, no crackles. Normal respiratory effort. No accessory muscle use.  Cardiovascular: Regular rate and rhythm, no murmurs / rubs / gallops. No extremity edema. 2+ pedal pulses. No carotid bruits.  Abdomen: no tenderness, no masses palpated. No hepatosplenomegaly. Bowel sounds positive.  Musculoskeletal: - Left hand-2nd-3rd digit-erythema edema-third digit Limited range of motion due to edema-pain unable to flex or extend positive pulse at the wrist 2ed digit dorsal laceration open draining purulent, discoloration visible with necrotic tissue no clubbing / cyanosis. No joint deformity upper and lower extremities. Good ROM, no contractures. Normal muscle tone.  Neurologic: CN II-XII grossly intact. Sensation intact, DTR normal. Strength 5/5 in all 4.  Psychiatric: Normal judgment and insight. Alert and oriented x 3. Normal mood.  Skin: no rashes, lesions, ulcers.  Left hand 2nd and 3rd digit with erythema and edema, second digit dorsal wound open draining purulent fluid    Labs on admission:    I have personally reviewed following labs and imaging studies  CBC: Recent Labs  Lab 06/21/24 1509  WBC 11.4*  NEUTROABS 9.2*  HGB 13.5  HCT 40.7  MCV 100.5*  PLT 289   Basic Metabolic Panel: Recent Labs  Lab 06/21/24 1509  NA 140  K 3.3*  CL 99  CO2 27  GLUCOSE 85  BUN 26*  CREATININE 1.40*  CALCIUM  8.6*     Urine  analysis:    Component Value Date/Time   COLORURINE YELLOW 01/15/2019 0006   APPEARANCEUR CLEAR 01/15/2019 0006   LABSPEC 1.014 01/15/2019 0006   PHURINE 6.0 01/15/2019 0006   GLUCOSEU NEGATIVE 01/15/2019 0006   HGBUR SMALL (A) 01/15/2019 0006   BILIRUBINUR NEGATIVE 01/15/2019 0006   KETONESUR NEGATIVE 01/15/2019 0006   PROTEINUR NEGATIVE 01/15/2019 0006   NITRITE NEGATIVE 01/15/2019 0006   LEUKOCYTESUR NEGATIVE 01/15/2019 0006    Last A1C:  Lab Results  Component Value Date   HGBA1C 5.5 09/17/2022     Radiologic Exams on Admission:  DG Hand Complete Left Result Date: 06/21/2024 CLINICAL DATA:  Left hand redness and swelling after possible bite. EXAM: LEFT HAND - COMPLETE 3+ VIEW COMPARISON:  None Available. FINDINGS: There is no evidence of fracture or dislocation. There is no evidence of arthropathy or other focal bone abnormality. Diffuse soft tissue swelling of left index finger is noted with associated laceration. Multiple small rounded densities are noted in the wound in dorsal soft tissues suggesting debris or foreign bodies. IMPRESSION: No fracture or dislocation is noted. Diffuse soft tissue swelling of left index finger is noted with associated laceration suggesting cellulitis. Multiple small rounded densities are noted in the wound in dorsal soft tissues of the left index finger suggesting debris or foreign bodies. Electronically Signed   By: Lynwood Landy Raddle M.D.   On: 06/21/2024 15:24    EKG:   Independently reviewed.  Orders placed or performed during the hospital encounter of 06/21/24   EKG 12-Lead   ---------------------------------------------------------------------------------------------------------------------------------------    Assessment / Plan:   Principal Problem:   Cellulitis Active Problems:   Essential hypertension   History of stroke   Hypokalemia   AKI (acute kidney injury) (HCC)   Assessment and Plan: * Cellulitis - Left hand  erythema edema 2nd and 3rd digit, third digit severely edematous, unable to flex, second digit with laceration wound is open draining purulent and necrotic appearing - Patient was given IV antibiotics of Unasyn , according to cellulitis protocol patient qualifies for IV antibiotics of Zosyn  -Will obtain blood cultures -Wound cultures per surgery I&D -As needed IV and p.o. analgesics -Will follow with Dr. Margrette -N.p.o. for now  Essential hypertension - Blood pressure elevated 150/76 - May be contributing by pain, will monitor closely -As needed hydralazine  -Will assess possibility of needing BP meds at discharge  AKI (acute kidney injury) (HCC) - BUN 26/creatinine 1.40 -Continue gentle IV fluid hydration - Avoid nephrotoxic   Hypokalemia - Serum potassium low at 3.3 -Replacing with 40 mEq of KCl     Consults called: Orthopedic-Dr. Margrette -------------------------------------------------------------------------------------------------------------------------------------------- DVT prophylaxis:  heparin  injection 5,000 Units Start: 06/21/24 2200 TED hose Start: 06/21/24 1633 SCDs Start: 06/21/24 1633   Code Status:   Code Status: Full Code   Admission status: Patient will be admitted as Observation, with a greater than 2 midnight length of stay. Level of care: Med-Surg   Family Communication:  none at bedside  (The above findings and plan of care has been discussed with patient in detail, the patient expressed understanding and agreement of above plan)  --------------------------------------------------------------------------------------------------------------------------------------------------  Disposition Plan:  Anticipated 1-2 days Status is: Observation The patient remains OBS appropriate and will d/c before 2  midnights.     ----------------------------------------------------------------------------------------------------------------------------------------------------  Time spent:  71  Min.  Was spent seeing and evaluating the patient, reviewing all medical records, drawn plan of care.  SIGNED: Adriana DELENA Grams, MD, FHM. FAAFP. [place] [facility], Pager  (Please use [email or web address] to page/ or secure chat through epic) If 7PM-7AM, please contact night-coverage [email or web address],  06/21/2024, 4:45 PM

## 2024-06-21 NOTE — Assessment & Plan Note (Signed)
-   Left hand erythema edema 2nd and 3rd digit, third digit severely edematous, unable to flex, second digit with laceration wound is open draining purulent and necrotic appearing - Patient was given IV antibiotics of Unasyn , according to cellulitis protocol patient qualifies for IV antibiotics of Zosyn  -Will obtain blood cultures -Wound cultures per surgery I&D -As needed IV and p.o. analgesics -Will follow with Dr. Margrette -N.p.o. for now

## 2024-06-21 NOTE — Hospital Course (Signed)
 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  About 9 days ago on July 5 he had a accidental laceration with tablesaw to the dorsal aspect of the finger.  At the time laceration was sutured dressed..  Now is progressively worsening with edema erythema was extending to his 2nd and 3rd digit onto his palm and hand area.  Third index pending there is edematous unable to flex or extend due to severe pain.  Second digit the wound is opened up and draining. Denies having any fever.   ED course: Blood pressure (!) 150/76, pulse 64, temperature 98 F (36.7 C), resp. rate 18, height 5' 9 (1.753 m), weight 71.7 kg, SpO2 100%.  Labs reviewed-within normal limits with exception of WBC of 11.4, potassium 3.3, BUN 26, creatinine 1.4, calcium  8.6  Patient was given IV antibiotics Unasyn  EDP Dr. Cleotilde has discussed the case with Dr. Margrette orthopedic with plan to evaluate for possible OR I&D.

## 2024-06-21 NOTE — ED Provider Notes (Signed)
  EMERGENCY DEPARTMENT AT [facility] Provider Note   CSN: [id number] Arrival date & time: 06/21/24  1402     Patient presents with: finger infection   Jason Walsh is a 63 y.o. male.   HPI   This patient is a 63 year old male, denies taking any chronic medications and has no chronic medical problems other than skin cancer on his left ear.  He reports that about 9 days ago, on July 5 the patient was seen because of a laceration to his left index finger that occurred when a table saw cut him on the dorsal aspect of the finger.  Imaging was obtained at that time which showed that there was some small radiopaque foreign bodies posterior to the index finger,  Prior to Admission medications   Medication Sig Start Date End Date Taking? Authorizing Provider  amLODipine  (NORVASC ) 10 MG tablet Take 1 tablet (10 mg total) by mouth daily. 04/21/24   Cook, [name], DO  aspirin  EC 81 MG tablet Take 1 tablet (81 mg total) by mouth daily. Swallow whole. 04/21/24   Cook, [name], DO  diclofenac  (VOLTAREN ) 75 MG EC tablet Take 1 tablet (75 mg total) by mouth 2 (two) times daily as needed for moderate pain (pain score 4-6). 10/23/23   Cook, [name], DO    Allergies: Ibuprofen  and Gabapentin     Review of Systems  Constitutional:  Negative for fever.  Skin:  Positive for rash.  All other systems reviewed and are negative.   Updated Vital Signs BP (!) 150/76 (BP Location: Right Arm)   Pulse 64   Temp 98 F (36.7 C)   Resp 18   Ht 1.753 m (5' 9)   Wt 71.7 kg   SpO2 100%   BMI 23.33 kg/m   Physical Exam Vitals and nursing note reviewed.  Constitutional:      Appearance: He is well-developed. He is not diaphoretic.  HENT:     Head: Normocephalic and atraumatic.  Eyes:     General:        Right eye: No discharge.        Left eye: No discharge.     Conjunctiva/sclera: Conjunctivae normal.  Pulmonary:     Effort: Pulmonary effort is normal. No respiratory distress.   Musculoskeletal:     Comments: Left hand is examined in its entirety, there is good range of motion of the wrist as well as the forearm elbow and shoulder.  There is no streaking up above the level of the wrist however there does appear to be some redness extending from the 2nd and 3rd digits onto the palm of the hand.  Both third finger is severely swollen edematous and he is unable to flex or extend without severe pain.  Second digit at the location of the wound has opened up and has become purulent and necrotic appearing  Skin:    General: Skin is warm and dry.     Findings: No erythema or rash.  Neurological:     Mental Status: He is alert.     Coordination: Coordination normal.     (all labs ordered are listed, but only abnormal results are displayed) Labs Reviewed  CBC WITH DIFFERENTIAL/PLATELET - Abnormal; Notable for the following components:      Result Value   WBC 11.4 (*)    RBC 4.05 (*)    MCV 100.5 (*)    Neutro Abs 9.2 (*)    All other components within normal  limits  BASIC METABOLIC PANEL WITH GFR - Abnormal; Notable for the following components:   Potassium 3.3 (*)    BUN 26 (*)    Creatinine, Ser 1.40 (*)    Calcium  8.6 (*)    GFR, Estimated 56 (*)    All other components within normal limits    EKG: None  Radiology: DG Hand Complete Left Result Date: 06/21/2024 CLINICAL DATA:  Left hand redness and swelling after possible bite. EXAM: LEFT HAND - COMPLETE 3+ VIEW COMPARISON:  None Available. FINDINGS: There is no evidence of fracture or dislocation. There is no evidence of arthropathy or other focal bone abnormality. Diffuse soft tissue swelling of left index finger is noted with associated laceration. Multiple small rounded densities are noted in the wound in dorsal soft tissues suggesting debris or foreign bodies. IMPRESSION: No fracture or dislocation is noted. Diffuse soft tissue swelling of left index finger is noted with associated laceration suggesting  cellulitis. Multiple small rounded densities are noted in the wound in dorsal soft tissues of the left index finger suggesting debris or foreign bodies. Electronically Signed   By: Lynwood Landy Raddle M.D.   On: 06/21/2024 15:24     Procedures   Medications Ordered in the ED  Ampicillin -Sulbactam (UNASYN ) 3 g in sodium chloride  0.9 % 100 mL IVPB (3 g Intravenous New Bag/Given 06/21/24 1527)                                         Medical Decision Making Amount and/or Complexity of Data Reviewed Labs: ordered. Radiology: ordered.  Risk Decision regarding hospitalization.    This patient presents to the ED for concern of worsening redness and swelling of the 2nd and 3rd fingers of the left hand differential diagnosis includes dactylitis, abscess, cellulitis    Additional history obtained   Additional history obtained from Electronic Medical Record External records from outside source obtained and reviewed including medical record including prior imaging studies   Lab Tests:  I Ordered, and personally interpreted labs.  The pertinent results include: CBC and metabolic panel ordered mild leukocytosis, mild renal insufficiency   Imaging Studies ordered:  I ordered imaging studies including left hand I independently visualized and interpreted imaging which showed swelling of the finger, foreign bodies present I agree with the radiologist interpretation   Medicines ordered and prescription drug management:  I ordered medication including Unasyn     I have reviewed the patients home medicines and have made adjustments as needed   Problem List / ED Course:  I discussed the case with the orthopedist Dr. Margrette who wants the patient to be admitted to the hospital and will likely take the patient to surgery and recommends antibiotics.  He will see the patient in consultation Will discuss with hospitalist for admission   Social Determinants of Health:   Tobacco  use       Final diagnoses:  Cellulitis of finger of left hand    ED Discharge Orders     None          Cleotilde Rogue, MD 06/21/24 [phone]

## 2024-06-21 NOTE — ED Triage Notes (Signed)
 Pt arrived via POV for concerns of possible infectionfrom a finger laceration has begun spreading across his body. Pt reports over the past few days, pain has worsened and pt reports he has developed a rash over his body. Pt recently had laceration repair here at [facility] on 7/5.

## 2024-06-21 NOTE — Assessment & Plan Note (Signed)
-   Serum potassium low at 3.3 -Replacing with 40 mEq of KCl

## 2024-06-21 NOTE — Assessment & Plan Note (Signed)
-   BUN 26/creatinine 1.40 -Continue gentle IV fluid hydration - Avoid nephrotoxic

## 2024-06-21 NOTE — ED Notes (Signed)
 Discussed pt presentation and provider recommended higher level of care. Discussed provider recommendations post triage and pt elected to go to ED prior to provider exam.

## 2024-06-21 NOTE — ED Triage Notes (Addendum)
 Pt presents for suture removal. Pt noted to have moderate drainage and tissue loss to left index finger. Left index finger and left middle finger noted to be swollen. Sutures were to be removed on 7/12 per chart. Has been cleaning site with peroxide.  Pt also noted to be unsteady on feet and reports  I dont feel well.

## 2024-06-21 NOTE — Progress Notes (Signed)
 PRN hydralazine  given per order patient BP elevated at this time, oxycodone  PO given with a few sips .   06/21/24 1737  Vitals  BP (!) 177/73  MAP (mmHg) 102  Pulse Rate 66  Resp 20  MEWS COLOR  MEWS Score Color Green  Oxygen  Therapy  SpO2 100 %  O2 Device Room Air  MEWS Score  MEWS Temp 0  MEWS Systolic 0  MEWS Pulse 0  MEWS RR 0  MEWS LOC 0  MEWS Score 0

## 2024-06-21 NOTE — Assessment & Plan Note (Signed)
-   Blood pressure elevated 150/76 - May be contributing by pain, will monitor closely -As needed hydralazine  -Will assess possibility of needing BP meds at discharge

## 2024-06-22 ENCOUNTER — Inpatient Hospital Stay (HOSPITAL_COMMUNITY): Admitting: Certified Registered"

## 2024-06-22 ENCOUNTER — Encounter (HOSPITAL_COMMUNITY)
Admission: EM | Disposition: A | Discharge status: Home / Self Care | Payer: Self-pay | Source: Home / Self Care | Attending: Family Medicine

## 2024-06-22 ENCOUNTER — Other Ambulatory Visit: Payer: Self-pay

## 2024-06-22 DIAGNOSIS — I96 Gangrene, not elsewhere classified: Secondary | ICD-10-CM | POA: Diagnosis present

## 2024-06-22 DIAGNOSIS — E785 Hyperlipidemia, unspecified: Secondary | ICD-10-CM | POA: Diagnosis present

## 2024-06-22 DIAGNOSIS — S61211A Laceration without foreign body of left index finger without damage to nail, initial encounter: Secondary | ICD-10-CM | POA: Diagnosis present

## 2024-06-22 DIAGNOSIS — Z91148 Patient's other noncompliance with medication regimen for other reason: Secondary | ICD-10-CM | POA: Diagnosis not present

## 2024-06-22 DIAGNOSIS — Z8249 Family history of ischemic heart disease and other diseases of the circulatory system: Secondary | ICD-10-CM | POA: Diagnosis not present

## 2024-06-22 DIAGNOSIS — M65942 Unspecified synovitis and tenosynovitis, left hand: Secondary | ICD-10-CM

## 2024-06-22 DIAGNOSIS — M65142 Other infective (teno)synovitis, left hand: Secondary | ICD-10-CM | POA: Diagnosis not present

## 2024-06-22 DIAGNOSIS — F17210 Nicotine dependence, cigarettes, uncomplicated: Secondary | ICD-10-CM | POA: Diagnosis present

## 2024-06-22 DIAGNOSIS — Z888 Allergy status to other drugs, medicaments and biological substances status: Secondary | ICD-10-CM | POA: Diagnosis not present

## 2024-06-22 DIAGNOSIS — I10 Essential (primary) hypertension: Secondary | ICD-10-CM | POA: Diagnosis not present

## 2024-06-22 DIAGNOSIS — M87045 Idiopathic aseptic necrosis of left finger(s): Secondary | ICD-10-CM

## 2024-06-22 DIAGNOSIS — Z8673 Personal history of transient ischemic attack (TIA), and cerebral infarction without residual deficits: Secondary | ICD-10-CM | POA: Diagnosis not present

## 2024-06-22 DIAGNOSIS — L03012 Cellulitis of left finger: Secondary | ICD-10-CM | POA: Diagnosis not present

## 2024-06-22 DIAGNOSIS — L0390 Cellulitis, unspecified: Secondary | ICD-10-CM | POA: Diagnosis present

## 2024-06-22 DIAGNOSIS — Z807 Family history of other malignant neoplasms of lymphoid, hematopoietic and related tissues: Secondary | ICD-10-CM | POA: Diagnosis not present

## 2024-06-22 DIAGNOSIS — Z7982 Long term (current) use of aspirin: Secondary | ICD-10-CM | POA: Diagnosis not present

## 2024-06-22 DIAGNOSIS — B9562 Methicillin resistant Staphylococcus aureus infection as the cause of diseases classified elsewhere: Secondary | ICD-10-CM | POA: Diagnosis present

## 2024-06-22 DIAGNOSIS — Z79899 Other long term (current) drug therapy: Secondary | ICD-10-CM | POA: Diagnosis not present

## 2024-06-22 DIAGNOSIS — E7800 Pure hypercholesterolemia, unspecified: Secondary | ICD-10-CM | POA: Diagnosis not present

## 2024-06-22 DIAGNOSIS — S61321A Laceration with foreign body of left index finger with damage to nail, initial encounter: Secondary | ICD-10-CM | POA: Diagnosis not present

## 2024-06-22 DIAGNOSIS — N179 Acute kidney failure, unspecified: Secondary | ICD-10-CM | POA: Diagnosis present

## 2024-06-22 DIAGNOSIS — Z85828 Personal history of other malignant neoplasm of skin: Secondary | ICD-10-CM | POA: Diagnosis not present

## 2024-06-22 DIAGNOSIS — E876 Hypokalemia: Secondary | ICD-10-CM | POA: Diagnosis present

## 2024-06-22 DIAGNOSIS — Z886 Allergy status to analgesic agent status: Secondary | ICD-10-CM | POA: Diagnosis not present

## 2024-06-22 DIAGNOSIS — Z820 Family history of epilepsy and other diseases of the nervous system: Secondary | ICD-10-CM | POA: Diagnosis not present

## 2024-06-22 DIAGNOSIS — W312XXA Contact with powered woodworking and forming machines, initial encounter: Secondary | ICD-10-CM | POA: Diagnosis not present

## 2024-06-22 HISTORY — PX: TENOLYSIS: SHX396

## 2024-06-22 LAB — BASIC METABOLIC PANEL WITH GFR
Anion gap: 10 (ref 5–15)
BUN: 17 mg/dL (ref 8–23)
CO2: 26 mmol/L (ref 22–32)
Calcium: 8.2 mg/dL — ABNORMAL LOW (ref 8.9–10.3)
Chloride: 102 mmol/L (ref 98–111)
Creatinine, Ser: 1.01 mg/dL (ref 0.61–1.24)
GFR, Estimated: >60 mL/min (ref >60)
Glucose, Bld: 103 mg/dL — ABNORMAL HIGH (ref 70–99)
Potassium: 3.1 mmol/L — ABNORMAL LOW (ref 3.5–5.1)
Sodium: 138 mmol/L (ref 135–145)

## 2024-06-22 LAB — BLOOD CULTURE ID PANEL (REFLEXED) - BCID2

## 2024-06-22 LAB — LIPID PANEL
Cholesterol: 133 mg/dL (ref 0–200)
HDL: 33 mg/dL — ABNORMAL LOW (ref >40)
LDL Cholesterol: 81 mg/dL (ref 0–99)
Total CHOL/HDL Ratio: 4 ratio
Triglycerides: 93 mg/dL (ref <150)
VLDL: 19 mg/dL (ref 0–40)

## 2024-06-22 LAB — CBC
HCT: 37.4 % — ABNORMAL LOW (ref 39.0–52.0)
Hemoglobin: 12.5 g/dL — ABNORMAL LOW (ref 13.0–17.0)
MCH: 33.5 pg (ref 26.0–34.0)
MCHC: 33.4 g/dL (ref 30.0–36.0)
MCV: 100.3 fL — ABNORMAL HIGH (ref 80.0–100.0)
Platelets: 265 K/uL (ref 150–400)
RBC: 3.73 MIL/uL — ABNORMAL LOW (ref 4.22–5.81)
RDW: 12.3 % (ref 11.5–15.5)
WBC: 11.3 K/uL — ABNORMAL HIGH (ref 4.0–10.5)
nRBC: 0 % (ref 0.0–0.2)

## 2024-06-22 LAB — HIV ANTIBODY (ROUTINE TESTING W REFLEX): HIV Screen 4th Generation wRfx: NONREACTIVE

## 2024-06-22 LAB — GLUCOSE, CAPILLARY: Glucose-Capillary: 109 mg/dL — ABNORMAL HIGH (ref 70–99)

## 2024-06-22 SURGERY — INCISION, TENDON SHEATH
Anesthesia: General | Site: Hand | Laterality: Left

## 2024-06-22 MED ORDER — ACETAMINOPHEN 500 MG PO TABS
1000.0000 mg | ORAL_TABLET | Freq: Four times a day (QID) | ORAL | Status: AC
  Administered 2024-06-22 – 2024-06-23 (×4): 1000 mg via ORAL
  Filled 2024-06-22 (×4): qty 2

## 2024-06-22 MED ORDER — DEXAMETHASONE SODIUM PHOSPHATE 10 MG/ML IJ SOLN
INTRAMUSCULAR | Status: AC
  Filled 2024-06-22: qty 1

## 2024-06-22 MED ORDER — LACTATED RINGERS IV SOLN: INTRAVENOUS | Status: DC

## 2024-06-22 MED ORDER — PROPOFOL 10 MG/ML IV BOLUS
INTRAVENOUS | Status: AC
  Filled 2024-06-22: qty 20

## 2024-06-22 MED ORDER — FENTANYL CITRATE PF 50 MCG/ML IJ SOSY
25.0000 ug | PREFILLED_SYRINGE | INTRAMUSCULAR | Status: DC | PRN
  Administered 2024-06-22 (×2): 50 ug via INTRAVENOUS
  Filled 2024-06-22 (×2): qty 1

## 2024-06-22 MED ORDER — POVIDONE-IODINE 10 % EX SWAB: 2.0000 | Freq: Once | CUTANEOUS | Status: DC

## 2024-06-22 MED ORDER — PROPOFOL 10 MG/ML IV BOLUS
INTRAVENOUS | Status: DC | PRN
  Administered 2024-06-22: 100 mg via INTRAVENOUS

## 2024-06-22 MED ORDER — LACTATED RINGERS IV SOLN: INTRAVENOUS | Status: DC | PRN

## 2024-06-22 MED ORDER — PHENYLEPHRINE 80 MCG/ML (10ML) SYRINGE FOR IV PUSH (FOR BLOOD PRESSURE SUPPORT)
PREFILLED_SYRINGE | INTRAVENOUS | Status: AC
  Filled 2024-06-22: qty 10

## 2024-06-22 MED ORDER — MIDAZOLAM HCL 5 MG/5ML IJ SOLN
INTRAMUSCULAR | Status: DC | PRN
  Administered 2024-06-22: 2 mg via INTRAVENOUS

## 2024-06-22 MED ORDER — MIDAZOLAM HCL 2 MG/2ML IJ SOLN
INTRAMUSCULAR | Status: AC
  Filled 2024-06-22: qty 2

## 2024-06-22 MED ORDER — POTASSIUM CHLORIDE CRYS ER 20 MEQ PO TBCR
40.0000 meq | EXTENDED_RELEASE_TABLET | Freq: Once | ORAL | Status: AC
  Administered 2024-06-22: 40 meq via ORAL
  Filled 2024-06-22: qty 2

## 2024-06-22 MED ORDER — ACETAMINOPHEN 325 MG PO TABS: 325.0000 mg | ORAL_TABLET | Freq: Four times a day (QID) | ORAL | Status: DC | PRN

## 2024-06-22 MED ORDER — OXYCODONE HCL 5 MG PO TABS
5.0000 mg | ORAL_TABLET | ORAL | Status: DC | PRN
  Administered 2024-06-23 – 2024-06-24 (×2): 10 mg via ORAL
  Filled 2024-06-22 (×2): qty 2

## 2024-06-22 MED ORDER — HYDROMORPHONE HCL 2 MG PO TABS
2.0000 mg | ORAL_TABLET | ORAL | Status: DC | PRN
  Administered 2024-06-22 – 2024-06-24 (×2): 2 mg via ORAL
  Filled 2024-06-22 (×2): qty 1

## 2024-06-22 MED ORDER — 0.9 % SODIUM CHLORIDE (POUR BTL) OPTIME
TOPICAL | Status: DC | PRN
  Administered 2024-06-22: 500 mL

## 2024-06-22 MED ORDER — PHENYLEPHRINE 80 MCG/ML (10ML) SYRINGE FOR IV PUSH (FOR BLOOD PRESSURE SUPPORT)
PREFILLED_SYRINGE | INTRAVENOUS | Status: DC | PRN
Start: 2024-06-22 — End: 2024-06-22
  Administered 2024-06-22: 320 ug via INTRAVENOUS

## 2024-06-22 MED ORDER — DEXAMETHASONE SODIUM PHOSPHATE 10 MG/ML IJ SOLN
INTRAMUSCULAR | Status: DC | PRN
  Administered 2024-06-22: 5 mg via INTRAVENOUS

## 2024-06-22 MED ORDER — SENNOSIDES-DOCUSATE SODIUM 8.6-50 MG PO TABS
1.0000 | ORAL_TABLET | Freq: Every day | ORAL | Status: DC
  Administered 2024-06-22 – 2024-06-23 (×2): 1 via ORAL
  Filled 2024-06-22 (×2): qty 1

## 2024-06-22 MED ORDER — BUPIVACAINE HCL (PF) 0.5 % IJ SOLN
INTRAMUSCULAR | Status: DC | PRN
  Administered 2024-06-22: 10 mL

## 2024-06-22 MED ORDER — FENTANYL CITRATE (PF) 100 MCG/2ML IJ SOLN
INTRAMUSCULAR | Status: AC
  Filled 2024-06-22: qty 2

## 2024-06-22 MED ORDER — ORAL CARE MOUTH RINSE: 15.0000 mL | Freq: Once | OROMUCOSAL | Status: DC

## 2024-06-22 MED ORDER — ONDANSETRON HCL 4 MG/2ML IJ SOLN
INTRAMUSCULAR | Status: DC | PRN
  Administered 2024-06-22: 4 mg via INTRAVENOUS

## 2024-06-22 MED ORDER — CHLORHEXIDINE GLUCONATE 4 % EX SOLN: 60.0000 mL | Freq: Once | CUTANEOUS | Status: DC

## 2024-06-22 MED ORDER — CHLORHEXIDINE GLUCONATE 0.12 % MT SOLN: 15.0000 mL | Freq: Once | OROMUCOSAL | Status: DC

## 2024-06-22 MED ORDER — ONDANSETRON HCL 4 MG/2ML IJ SOLN
INTRAMUSCULAR | Status: AC
  Filled 2024-06-22: qty 4

## 2024-06-22 MED ORDER — HYDROMORPHONE HCL 1 MG/ML IJ SOLN
0.5000 mg | INTRAMUSCULAR | Status: DC | PRN
  Administered 2024-06-22: 1 mg via INTRAVENOUS
  Filled 2024-06-22: qty 1

## 2024-06-22 MED ORDER — EPHEDRINE SULFATE-NACL 50-0.9 MG/10ML-% IV SOSY
PREFILLED_SYRINGE | INTRAVENOUS | Status: DC | PRN
Start: 2024-06-22 — End: 2024-06-22
  Administered 2024-06-22: 10 mg via INTRAVENOUS

## 2024-06-22 MED ORDER — BUPIVACAINE HCL (PF) 0.5 % IJ SOLN
INTRAMUSCULAR | Status: AC
  Filled 2024-06-22: qty 30

## 2024-06-22 SURGICAL SUPPLY — 29 items
BANDAGE ESMARK 4X12 BL STRL LF (DISPOSABLE) IMPLANT
CLOTH BEACON ORANGE TIMEOUT ST (SAFETY) IMPLANT
COVER LIGHT HANDLE STERIS (MISCELLANEOUS) IMPLANT
DRAPE HALF SHEET 40X57 (DRAPES) IMPLANT
ELECTRODE REM PT RTRN 9FT ADLT (ELECTROSURGICAL) IMPLANT
GAUZE SPONGE 4X4 12PLY STRL (GAUZE/BANDAGES/DRESSINGS) IMPLANT
GAUZE XEROFORM 1X8 LF (GAUZE/BANDAGES/DRESSINGS) IMPLANT
GLOVE BIO SURGEON STRL SZ7 (GLOVE) IMPLANT
GLOVE BIOGEL PI IND STRL 7.0 (GLOVE) IMPLANT
GLOVE BIOGEL PI IND STRL 8.5 (GLOVE) IMPLANT
GLOVE SKINSENSE STRL SZ8.0 LF (GLOVE) IMPLANT
GOWN STRL REUS W/TWL LRG LVL3 (GOWN DISPOSABLE) IMPLANT
GOWN STRL REUS W/TWL XL LVL3 (GOWN DISPOSABLE) IMPLANT
IV CATH 18G SAFETY (IV SOLUTION) IMPLANT
IV CATH PLACEMENT 20 GA (IV SOLUTION) IMPLANT
KIT TURNOVER KIT A (KITS) IMPLANT
MANIFOLD NEPTUNE II (INSTRUMENTS) IMPLANT
NDL HYPO 21X1.5 SAFETY (NEEDLE) IMPLANT
NEEDLE HYPO 21X1.5 SAFETY (NEEDLE) ×1 IMPLANT
NS IRRIG 1000ML POUR BTL (IV SOLUTION) IMPLANT
PACK BASIC LIMB (CUSTOM PROCEDURE TRAY) IMPLANT
PAD ARMBOARD POSITIONER FOAM (MISCELLANEOUS) IMPLANT
POSITIONER HAND ALUMI XLG (MISCELLANEOUS) IMPLANT
POSITIONER HEAD 8X9X4 ADT (SOFTGOODS) IMPLANT
SET BASIN LINEN APH (SET/KITS/TRAYS/PACK) IMPLANT
SUT 3-0 BLK 1X30 PSL (SUTURE) IMPLANT
SWAB CULTURE ESWAB REG 1ML (MISCELLANEOUS) IMPLANT
SYR 10ML LL (SYRINGE) IMPLANT
SYR CONTROL 10ML LL (SYRINGE) IMPLANT

## 2024-06-22 NOTE — Anesthesia Postprocedure Evaluation (Signed)
 Anesthesia Post Note  Patient: MONTAGUE CORELLA  Procedure(s) Performed: INCISION, TENDON SHEATH (Left: Hand)  Patient location during evaluation: PACU Anesthesia Type: General Level of consciousness: awake and alert Pain management: pain level controlled Vital Signs Assessment: post-procedure vital signs reviewed and stable Respiratory status: spontaneous breathing, nonlabored ventilation, respiratory function stable and patient connected to nasal cannula oxygen  Cardiovascular status: blood pressure returned to baseline and stable Postop Assessment: no apparent nausea or vomiting Anesthetic complications: no   There were no known notable events for this encounter.   Last Vitals:  Vitals:   06/22/24 1330 06/22/24 1613  BP: (!) 162/68 (!) 145/72  Pulse: (!) 57 65  Resp: 18 (!) 6  Temp:  (!) 36.4 C  SpO2: 100% 100%    Last Pain:  Vitals:   06/22/24 1325  TempSrc:   PainSc: 0-No pain                 Jaselle Pryer L Davone Shinault

## 2024-06-22 NOTE — Consult Note (Addendum)
 Reason for Consult: laceration left index finger and infection left long finger  Referring Physician: Dr Cleotilde / ER   Jason Walsh is an 63 y.o. male.  HPI: Jason Walsh was seen in the ER last week secondary to laceration over his left index finger from a saw injury.  Wound closure was performed to protect the tendon integrity however this area seems to have necrosis to a degree.  However the long finger or middle finger over the last weekend has a puncture wound near the DIP joint and the entire volar aspect of the finger is erythematous especially from the PIP joint distally but he also has some mild erythema over the proximal phalanx and into the palm.  He says the swelling came after the pustule came up over the long finger.  He has a potential flexor tenosynovitis and is brought in for IV antibiotics and possible incision and drainage long finger and debridement of skin flap index finger  Past Medical History:  Diagnosis Date   Cancer (HCC) 2010   skin   Lumbar disc disorder    Stroke (HCC) 2016    Past Surgical History:  Procedure Laterality Date   FINGER SURGERY     L index and pinky   IRRIGATION AND DEBRIDEMENT KNEE Right 07/10/2017   Procedure: RIGHT KNEE SCOPE IRRIGATION AND DEBRIDEMENT;  Surgeon: Gerome Charleston, MD;  Location: [facility];  Service: Orthopedics;  Laterality: Right;   MENISCUS REPAIR  09/09/2017   NOSE SURGERY     skin cancer   SKIN CANCER EXCISION Bilateral    basal cell    Family History  Problem Relation Age of Onset   Cancer Mother        lymphoma   Hypertension Mother    Parkinson's disease Father    Alzheimer's disease Father     Social History:  reports that he has been smoking cigarettes. He started smoking about 55 years ago. He has a 55.4 pack-year smoking history. He has never used smokeless tobacco. He reports that he does not currently use alcohol. He reports current drug use. Drugs: Marijuana and Cocaine.  Allergies:  Allergies  Allergen  Reactions   Gabapentin      Pain     Medications: I have reviewed the patient's current medications.  Results for orders placed or performed during the hospital encounter of 06/21/24 (from the past 48 hours)  CBC with Differential     Status: Abnormal   Collection Time: 06/21/24  3:09 PM  Result Value Ref Range   WBC 11.4 (H) 4.0 - 10.5 K/uL   RBC 4.05 (L) 4.22 - 5.81 MIL/uL   Hemoglobin 13.5 13.0 - 17.0 g/dL   HCT 59.2 60.9 - 47.9 %   MCV 100.5 (H) 80.0 - 100.0 fL   MCH 33.3 26.0 - 34.0 pg   MCHC 33.2 30.0 - 36.0 g/dL   RDW 87.5 88.4 - 84.4 %   Platelets 289 150 - 400 K/uL   nRBC 0.0 0.0 - 0.2 %   Neutrophils Relative % 81 %   Neutro Abs 9.2 (H) 1.7 - 7.7 K/uL   Lymphocytes Relative 11 %   Lymphs Abs 1.2 0.7 - 4.0 K/uL   Monocytes Relative 8 %   Monocytes Absolute 0.9 0.1 - 1.0 K/uL   Eosinophils Relative 0 %   Eosinophils Absolute 0.0 0.0 - 0.5 K/uL   Basophils Relative 0 %   Basophils Absolute 0.0 0.0 - 0.1 K/uL   Immature Granulocytes 0 %  Abs Immature Granulocytes 0.03 0.00 - 0.07 K/uL    Comment: Performed at [facility], [address], [place], [place] [address]  Basic metabolic panel     Status: Abnormal   Collection Time: 06/21/24  3:09 PM  Result Value Ref Range   Sodium 140 135 - 145 mmol/L   Potassium 3.3 (L) 3.5 - 5.1 mmol/L   Chloride 99 98 - 111 mmol/L   CO2 27 22 - 32 mmol/L   Glucose, Bld 85 70 - 99 mg/dL    Comment: Glucose reference range applies only to samples taken after fasting for at least 8 hours.   BUN 26 (H) 8 - 23 mg/dL   Creatinine, Ser 8.59 (H) 0.61 - 1.24 mg/dL   Calcium  8.6 (L) 8.9 - 10.3 mg/dL   GFR, Estimated 56 (L) >60 mL/min    Comment: (NOTE) Calculated using the CKD-EPI Creatinine Equation (2021)    Anion gap 14 5 - 15    Comment: Performed at [facility], [address], [place], [place] [address]  Culture, blood (Routine X 2) w Reflex to ID Panel     Status: None (Preliminary result)   Collection Time: 06/21/24  3:09 PM    Specimen: Right Antecubital; Blood  Result Value Ref Range   Specimen Description      RIGHT ANTECUBITAL BOTTLES DRAWN AEROBIC AND ANAEROBIC   Special Requests Blood Culture adequate volume    Culture  Setup Time      AEROBIC BOTTLE ONLY GRAM POSITIVE COCCI IN CLUSTERS Gram Stain Report Called to,Read Back By and Verified With: B GALLOWAY AT 0806 ON 07.15.25 BY ADGER J Performed at [facility], [address] [place], [place] [address]    Culture GRAM POSITIVE COCCI    Report Status PENDING   Magnesium     Status: Abnormal   Collection Time: 06/21/24  3:09 PM  Result Value Ref Range   Magnesium 2.5 (H) 1.7 - 2.4 mg/dL    Comment: Performed at [facility], [address], [place], [place] [address]  Phosphorus     Status: None   Collection Time: 06/21/24  3:09 PM  Result Value Ref Range   Phosphorus 3.2 2.5 - 4.6 mg/dL    Comment: Performed at [facility], [address] [place], [place] [address]  Culture, blood (Routine X 2) w Reflex to ID Panel     Status: None (Preliminary result)   Collection Time: 06/21/24  5:17 PM   Specimen: Right Antecubital; Blood  Result Value Ref Range   Specimen Description RIGHT ANTECUBITAL AEROBIC BOTTLE ONLY    Special Requests      Blood Culture results may not be optimal due to an inadequate volume of blood received in culture bottles   Culture      NO GROWTH < 24 HOURS Performed at [facility], [address], [place], [place] [address]    Report Status PENDING   Basic metabolic panel     Status: Abnormal   Collection Time: 06/22/24  4:27 AM  Result Value Ref Range   Sodium 138 135 - 145 mmol/L   Potassium 3.1 (L) 3.5 - 5.1 mmol/L   Chloride 102 98 - 111 mmol/L   CO2 26 22 - 32 mmol/L   Glucose, Bld 103 (H) 70 - 99 mg/dL    Comment: Glucose reference range applies only to samples taken after fasting for at least 8 hours.   BUN 17 8 - 23 mg/dL   Creatinine, Ser 8.98 0.61 -  1.24 mg/dL   Calcium  8.2 (L) 8.9 - 10.3 mg/dL   GFR, Estimated  >39 >39 mL/min    Comment: (NOTE) Calculated using the CKD-EPI Creatinine Equation (2021)    Anion gap 10 5 - 15    Comment: Performed at [facility], [address] [place], [place] [address]  CBC     Status: Abnormal   Collection Time: 06/22/24  4:27 AM  Result Value Ref Range   WBC 11.3 (H) 4.0 - 10.5 K/uL   RBC 3.73 (L) 4.22 - 5.81 MIL/uL   Hemoglobin 12.5 (L) 13.0 - 17.0 g/dL   HCT 62.5 (L) 60.9 - 47.9 %   MCV 100.3 (H) 80.0 - 100.0 fL   MCH 33.5 26.0 - 34.0 pg   MCHC 33.4 30.0 - 36.0 g/dL   RDW 87.6 88.4 - 84.4 %   Platelets 265 150 - 400 K/uL   nRBC 0.0 0.0 - 0.2 %    Comment: Performed at [facility], [address] [place], [place] [address]  Lipid panel     Status: Abnormal   Collection Time: 06/22/24  4:27 AM  Result Value Ref Range   Cholesterol 133 0 - 200 mg/dL   Triglycerides 93 <849 mg/dL   HDL 33 (L) >59 mg/dL   Total CHOL/HDL Ratio 4.0 RATIO   VLDL 19 0 - 40 mg/dL   LDL Cholesterol 81 0 - 99 mg/dL    Comment:        Total Cholesterol/HDL:CHD Risk Coronary Heart Disease Risk Table                     Men   Women  1/2 Average Risk   3.4   3.3  Average Risk       5.0   4.4  2 X Average Risk   9.6   7.1  3 X Average Risk  23.4   11.0        Use the calculated Patient Ratio above and the CHD Risk Table to determine the patient's CHD Risk.        ATP III CLASSIFICATION (LDL):  <100     mg/dL   Optimal  899-870  mg/dL   Near or Above                    Optimal  130-159  mg/dL   Borderline  839-810  mg/dL   High  >809     mg/dL   Very High Performed at [facility], [address] [place], [place] [address]   Glucose, capillary     Status: Abnormal   Collection Time: 06/22/24  5:21 AM  Result Value Ref Range   Glucose-Capillary 109 (H) 70 - 99 mg/dL    Comment: Glucose reference range applies only to samples taken after fasting for at least 8 hours.   I will read the left hand x-ray.  Soft tissue swelling along the left index finger is noted no  fracture or dislocation.  Debris is noted in the dorsal soft tissues of the left index finger as well.  DG Hand Complete Left Result Date: 06/21/2024 CLINICAL DATA:  Left hand redness and swelling after possible bite. EXAM: LEFT HAND - COMPLETE 3+ VIEW COMPARISON:  None Available. FINDINGS: There is no evidence of fracture or dislocation. There is no evidence of arthropathy or other focal bone abnormality. Diffuse soft tissue swelling of left index finger is noted with associated laceration. Multiple small rounded densities are  noted in the wound in dorsal soft tissues suggesting debris or foreign bodies. IMPRESSION: No fracture or dislocation is noted. Diffuse soft tissue swelling of left index finger is noted with associated laceration suggesting cellulitis. Multiple small rounded densities are noted in the wound in dorsal soft tissues of the left index finger suggesting debris or foreign bodies. Electronically Signed   By: Lynwood Landy Raddle M.D.   On: 06/21/2024 15:24    Review of Systems  Constitutional:  Negative for activity change, appetite change, chills, diaphoresis, fatigue, fever and unexpected weight change.  All other systems reviewed and are negative.  Blood pressure (!) 164/69, pulse 63, temperature 98.3 F (36.8 C), temperature source Oral, resp. rate 16, height 5' 9 (1.753 m), weight 69.6 kg, SpO2 98%. Physical Exam Vitals and nursing note reviewed.  Constitutional:      General: He is not in acute distress.    Appearance: Normal appearance. He is underweight. He is not ill-appearing, toxic-appearing or diaphoretic.  HENT:     Head: Normocephalic and atraumatic.     Right Ear: External ear normal.     Left Ear: External ear normal.     Nose: Nose normal. No congestion or rhinorrhea.     Mouth/Throat:     Pharynx: Oropharynx is clear. No oropharyngeal exudate.  Eyes:     General: No scleral icterus.       Right eye: No discharge.        Left eye: No discharge.      Extraocular Movements: Extraocular movements intact.     Conjunctiva/sclera: Conjunctivae normal.     Pupils: Pupils are equal, round, and reactive to light.  Cardiovascular:     Rate and Rhythm: Normal rate.     Pulses: Normal pulses.  Pulmonary:     Effort: Pulmonary effort is normal.     Breath sounds: No stridor. No wheezing or rhonchi.  Abdominal:     General: Abdomen is flat. There is no distension.  Musculoskeletal:     Cervical back: Neck supple.     Comments: Will start with left index finger dorsum of the wound over the middle phalanx and distal phalanx multiple sutures are noted the skin looks black.  He can barely move it.  Cannot determine the integrity of extensor tendons at this point flexor tendons intact  The left long/middle finger has a puncture wound with a pustule over the radial surface of the DIP joint area with erythema more extensive from the middle phalanx distally and then less extend extensive proximal phalanx into the palm with tenderness in this area and limited flexion.  Skin:    Capillary Refill: Capillary refill takes less than 2 seconds.  Neurological:     General: No focal deficit present.     Mental Status: He is alert and oriented to person, place, and time. Mental status is at baseline.     Cranial Nerves: No cranial nerve deficit.     Sensory: No sensory deficit.     Motor: No weakness.     Coordination: Coordination normal.     Gait: Gait normal.     Deep Tendon Reflexes: Reflexes normal.  Psychiatric:        Mood and Affect: Mood normal.        Behavior: Behavior normal.        Thought Content: Thought content normal.        Judgment: Judgment normal.     Assessment/Plan: #1 possible flexor tenosynovitis left long finger  #  2 wound necrosis left index finger/tablesaw laceration  Incision and drainage of flexor tenosynovitis left long finger debridement left index finger  The risk and benefits of surgical intervention have been  explained to the patient he has agreed to planned procedure  [name] 06/22/2024, 10:06 AM

## 2024-06-22 NOTE — Plan of Care (Signed)
  Problem: Acute Rehab OT Goals (only OT should resolve) Goal: Pt/Caregiver Will Perform Home Exercise Program Flowsheets (Taken 06/22/2024 1031) Pt/caregiver will Perform Home Exercise Program:  Increased ROM  Increased strength  Left upper extremity  Independently  Elmarie Devlin OT, MOT

## 2024-06-22 NOTE — H&P (View-Only) (Signed)
 Reason for Consult: laceration left index finger and infection left long finger  Referring Physician: Dr Cleotilde / ER   Jason Walsh is an 63 y.o. male.  HPI: Jason Walsh was seen in the ER last week secondary to laceration over his left index finger from a saw injury.  Wound closure was performed to protect the tendon integrity however this area seems to have necrosis to a degree.  However the long finger or middle finger over the last weekend has a puncture wound near the DIP joint and the entire volar aspect of the finger is erythematous especially from the PIP joint distally but he also has some mild erythema over the proximal phalanx and into the palm.  He says the swelling came after the pustule came up over the long finger.  He has a potential flexor tenosynovitis and is brought in for IV antibiotics and possible incision and drainage long finger and debridement of skin flap index finger  Past Medical History:  Diagnosis Date   Cancer (HCC) 2010   skin   Lumbar disc disorder    Stroke (HCC) 2016    Past Surgical History:  Procedure Laterality Date   FINGER SURGERY     L index and pinky   IRRIGATION AND DEBRIDEMENT KNEE Right 07/10/2017   Procedure: RIGHT KNEE SCOPE IRRIGATION AND DEBRIDEMENT;  Surgeon: Gerome Charleston, MD;  Location: [facility];  Service: Orthopedics;  Laterality: Right;   MENISCUS REPAIR  09/09/2017   NOSE SURGERY     skin cancer   SKIN CANCER EXCISION Bilateral    basal cell    Family History  Problem Relation Age of Onset   Cancer Mother        lymphoma   Hypertension Mother    Parkinson's disease Father    Alzheimer's disease Father     Social History:  reports that he has been smoking cigarettes. He started smoking about 55 years ago. He has a 55.4 pack-year smoking history. He has never used smokeless tobacco. He reports that he does not currently use alcohol. He reports current drug use. Drugs: Marijuana and Cocaine.  Allergies:  Allergies  Allergen  Reactions   Gabapentin      Pain     Medications: I have reviewed the patient's current medications.  Results for orders placed or performed during the hospital encounter of 06/21/24 (from the past 48 hours)  CBC with Differential     Status: Abnormal   Collection Time: 06/21/24  3:09 PM  Result Value Ref Range   WBC 11.4 (H) 4.0 - 10.5 K/uL   RBC 4.05 (L) 4.22 - 5.81 MIL/uL   Hemoglobin 13.5 13.0 - 17.0 g/dL   HCT 59.2 60.9 - 47.9 %   MCV 100.5 (H) 80.0 - 100.0 fL   MCH 33.3 26.0 - 34.0 pg   MCHC 33.2 30.0 - 36.0 g/dL   RDW 87.5 88.4 - 84.4 %   Platelets 289 150 - 400 K/uL   nRBC 0.0 0.0 - 0.2 %   Neutrophils Relative % 81 %   Neutro Abs 9.2 (H) 1.7 - 7.7 K/uL   Lymphocytes Relative 11 %   Lymphs Abs 1.2 0.7 - 4.0 K/uL   Monocytes Relative 8 %   Monocytes Absolute 0.9 0.1 - 1.0 K/uL   Eosinophils Relative 0 %   Eosinophils Absolute 0.0 0.0 - 0.5 K/uL   Basophils Relative 0 %   Basophils Absolute 0.0 0.0 - 0.1 K/uL   Immature Granulocytes 0 %  Abs Immature Granulocytes 0.03 0.00 - 0.07 K/uL    Comment: Performed at [facility], [address], [place], [place] [address]  Basic metabolic panel     Status: Abnormal   Collection Time: 06/21/24  3:09 PM  Result Value Ref Range   Sodium 140 135 - 145 mmol/L   Potassium 3.3 (L) 3.5 - 5.1 mmol/L   Chloride 99 98 - 111 mmol/L   CO2 27 22 - 32 mmol/L   Glucose, Bld 85 70 - 99 mg/dL    Comment: Glucose reference range applies only to samples taken after fasting for at least 8 hours.   BUN 26 (H) 8 - 23 mg/dL   Creatinine, Ser 8.59 (H) 0.61 - 1.24 mg/dL   Calcium  8.6 (L) 8.9 - 10.3 mg/dL   GFR, Estimated 56 (L) >60 mL/min    Comment: (NOTE) Calculated using the CKD-EPI Creatinine Equation (2021)    Anion gap 14 5 - 15    Comment: Performed at [facility], [address], [place], [place] [address]  Culture, blood (Routine X 2) w Reflex to ID Panel     Status: None (Preliminary result)   Collection Time: 06/21/24  3:09 PM    Specimen: Right Antecubital; Blood  Result Value Ref Range   Specimen Description      RIGHT ANTECUBITAL BOTTLES DRAWN AEROBIC AND ANAEROBIC   Special Requests Blood Culture adequate volume    Culture  Setup Time      AEROBIC BOTTLE ONLY GRAM POSITIVE COCCI IN CLUSTERS Gram Stain Report Called to,Read Back By and Verified With: B GALLOWAY AT 0806 ON 07.15.25 BY ADGER J Performed at [facility], [address] [place], [place] [address]    Culture GRAM POSITIVE COCCI    Report Status PENDING   Magnesium     Status: Abnormal   Collection Time: 06/21/24  3:09 PM  Result Value Ref Range   Magnesium 2.5 (H) 1.7 - 2.4 mg/dL    Comment: Performed at [facility], [address], [place], [place] [address]  Phosphorus     Status: None   Collection Time: 06/21/24  3:09 PM  Result Value Ref Range   Phosphorus 3.2 2.5 - 4.6 mg/dL    Comment: Performed at [facility], [address] [place], [place] [address]  Culture, blood (Routine X 2) w Reflex to ID Panel     Status: None (Preliminary result)   Collection Time: 06/21/24  5:17 PM   Specimen: Right Antecubital; Blood  Result Value Ref Range   Specimen Description RIGHT ANTECUBITAL AEROBIC BOTTLE ONLY    Special Requests      Blood Culture results may not be optimal due to an inadequate volume of blood received in culture bottles   Culture      NO GROWTH < 24 HOURS Performed at [facility], [address], [place], [place] [address]    Report Status PENDING   Basic metabolic panel     Status: Abnormal   Collection Time: 06/22/24  4:27 AM  Result Value Ref Range   Sodium 138 135 - 145 mmol/L   Potassium 3.1 (L) 3.5 - 5.1 mmol/L   Chloride 102 98 - 111 mmol/L   CO2 26 22 - 32 mmol/L   Glucose, Bld 103 (H) 70 - 99 mg/dL    Comment: Glucose reference range applies only to samples taken after fasting for at least 8 hours.   BUN 17 8 - 23 mg/dL   Creatinine, Ser 8.98 0.61 -  1.24 mg/dL   Calcium  8.2 (L) 8.9 - 10.3 mg/dL   GFR, Estimated  >39 >39 mL/min    Comment: (NOTE) Calculated using the CKD-EPI Creatinine Equation (2021)    Anion gap 10 5 - 15    Comment: Performed at [facility], [address] [place], [place] [address]  CBC     Status: Abnormal   Collection Time: 06/22/24  4:27 AM  Result Value Ref Range   WBC 11.3 (H) 4.0 - 10.5 K/uL   RBC 3.73 (L) 4.22 - 5.81 MIL/uL   Hemoglobin 12.5 (L) 13.0 - 17.0 g/dL   HCT 62.5 (L) 60.9 - 47.9 %   MCV 100.3 (H) 80.0 - 100.0 fL   MCH 33.5 26.0 - 34.0 pg   MCHC 33.4 30.0 - 36.0 g/dL   RDW 87.6 88.4 - 84.4 %   Platelets 265 150 - 400 K/uL   nRBC 0.0 0.0 - 0.2 %    Comment: Performed at [facility], [address] [place], [place] [address]  Lipid panel     Status: Abnormal   Collection Time: 06/22/24  4:27 AM  Result Value Ref Range   Cholesterol 133 0 - 200 mg/dL   Triglycerides 93 <849 mg/dL   HDL 33 (L) >59 mg/dL   Total CHOL/HDL Ratio 4.0 RATIO   VLDL 19 0 - 40 mg/dL   LDL Cholesterol 81 0 - 99 mg/dL    Comment:        Total Cholesterol/HDL:CHD Risk Coronary Heart Disease Risk Table                     Men   Women  1/2 Average Risk   3.4   3.3  Average Risk       5.0   4.4  2 X Average Risk   9.6   7.1  3 X Average Risk  23.4   11.0        Use the calculated Patient Ratio above and the CHD Risk Table to determine the patient's CHD Risk.        ATP III CLASSIFICATION (LDL):  <100     mg/dL   Optimal  899-870  mg/dL   Near or Above                    Optimal  130-159  mg/dL   Borderline  839-810  mg/dL   High  >809     mg/dL   Very High Performed at [facility], [address] [place], [place] [address]   Glucose, capillary     Status: Abnormal   Collection Time: 06/22/24  5:21 AM  Result Value Ref Range   Glucose-Capillary 109 (H) 70 - 99 mg/dL    Comment: Glucose reference range applies only to samples taken after fasting for at least 8 hours.   I will read the left hand x-ray.  Soft tissue swelling along the left index finger is noted no  fracture or dislocation.  Debris is noted in the dorsal soft tissues of the left index finger as well.  DG Hand Complete Left Result Date: 06/21/2024 CLINICAL DATA:  Left hand redness and swelling after possible bite. EXAM: LEFT HAND - COMPLETE 3+ VIEW COMPARISON:  None Available. FINDINGS: There is no evidence of fracture or dislocation. There is no evidence of arthropathy or other focal bone abnormality. Diffuse soft tissue swelling of left index finger is noted with associated laceration. Multiple small rounded densities are  noted in the wound in dorsal soft tissues suggesting debris or foreign bodies. IMPRESSION: No fracture or dislocation is noted. Diffuse soft tissue swelling of left index finger is noted with associated laceration suggesting cellulitis. Multiple small rounded densities are noted in the wound in dorsal soft tissues of the left index finger suggesting debris or foreign bodies. Electronically Signed   By: Lynwood Landy Raddle M.D.   On: 06/21/2024 15:24    Review of Systems  Constitutional:  Negative for activity change, appetite change, chills, diaphoresis, fatigue, fever and unexpected weight change.  All other systems reviewed and are negative.  Blood pressure (!) 164/69, pulse 63, temperature 98.3 F (36.8 C), temperature source Oral, resp. rate 16, height 5' 9 (1.753 m), weight 69.6 kg, SpO2 98%. Physical Exam Vitals and nursing note reviewed.  Constitutional:      General: He is not in acute distress.    Appearance: Normal appearance. He is underweight. He is not ill-appearing, toxic-appearing or diaphoretic.  HENT:     Head: Normocephalic and atraumatic.     Right Ear: External ear normal.     Left Ear: External ear normal.     Nose: Nose normal. No congestion or rhinorrhea.     Mouth/Throat:     Pharynx: Oropharynx is clear. No oropharyngeal exudate.  Eyes:     General: No scleral icterus.       Right eye: No discharge.        Left eye: No discharge.      Extraocular Movements: Extraocular movements intact.     Conjunctiva/sclera: Conjunctivae normal.     Pupils: Pupils are equal, round, and reactive to light.  Cardiovascular:     Rate and Rhythm: Normal rate.     Pulses: Normal pulses.  Pulmonary:     Effort: Pulmonary effort is normal.     Breath sounds: No stridor. No wheezing or rhonchi.  Abdominal:     General: Abdomen is flat. There is no distension.  Musculoskeletal:     Cervical back: Neck supple.     Comments: Will start with left index finger dorsum of the wound over the middle phalanx and distal phalanx multiple sutures are noted the skin looks black.  He can barely move it.  Cannot determine the integrity of extensor tendons at this point flexor tendons intact  The left long/middle finger has a puncture wound with a pustule over the radial surface of the DIP joint area with erythema more extensive from the middle phalanx distally and then less extend extensive proximal phalanx into the palm with tenderness in this area and limited flexion.  Skin:    Capillary Refill: Capillary refill takes less than 2 seconds.  Neurological:     General: No focal deficit present.     Mental Status: He is alert and oriented to person, place, and time. Mental status is at baseline.     Cranial Nerves: No cranial nerve deficit.     Sensory: No sensory deficit.     Motor: No weakness.     Coordination: Coordination normal.     Gait: Gait normal.     Deep Tendon Reflexes: Reflexes normal.  Psychiatric:        Mood and Affect: Mood normal.        Behavior: Behavior normal.        Thought Content: Thought content normal.        Judgment: Judgment normal.     Assessment/Plan: #1 possible flexor tenosynovitis left long finger  #  2 wound necrosis left index finger/tablesaw laceration  Incision and drainage of flexor tenosynovitis left long finger debridement left index finger  The risk and benefits of surgical intervention have been  explained to the patient he has agreed to planned procedure  [name] 06/22/2024, 10:06 AM

## 2024-06-22 NOTE — Progress Notes (Signed)
 PROGRESS NOTE    Patient: Jason Walsh                            PCP: Cook, [name], DO                    DOB: 21-Jan-1961            DOA: 06/21/2024 FMW:[id number]             DOS: 06/22/2024, 10:54 AM   LOS: 0 days   Date of Service: The patient was seen and examined on 06/22/2024  Subjective:   The patient was seen and examined this morning. Hypertensive, hemodynamically stable. No issues overnight .  Brief Narrative:   Jason Walsh is a 66 with no significant past medical history with exception of skin cancer chronic on left ear presented to the ED with a chief complaint of erythema edema to his left index finger.  About 9 days ago on July 5 he had a accidental laceration with tablesaw to the dorsal aspect of the finger.  At the time laceration was sutured dressed..  Now is progressively worsening with edema erythema was extending to his 2nd and 3rd digit onto his palm and hand area.  Third index pending there is edematous unable to flex or extend due to severe pain.  Second digit the wound is opened up and draining. Denies having any fever.   ED course: Blood pressure (!) 150/76, pulse 64, temperature 98 F (36.7 C), resp. rate 18, height 5' 9 (1.753 m), weight 71.7 kg, SpO2 100%.  Labs reviewed-within normal limits with exception of WBC of 11.4, potassium 3.3, BUN 26, creatinine 1.4, calcium  8.6  Patient was given IV antibiotics Unasyn  EDP Dr. Cleotilde has discussed the case with Dr. Margrette orthopedic with plan to evaluate for possible OR I&D.        Assessment & Plan:   Principal Problem:   Cellulitis Active Problems:   History of stroke   Essential hypertension   Tobacco abuse   Hypokalemia   AKI (acute kidney injury) (HCC)     Assessment and Plan: * Cellulitis -No significant changes, - Left hand erythema edema 2nd and 3rd digit, third digit severely edematous, unable to flex, second digit with laceration wound is open draining purulent and necrotic  appearing - Patient was given IV antibiotics of Unasyn , according to cellulitis protocol patient qualifies for IV antibiotics of Zosyn -Zyvox  was added -Will obtain blood cultures -Wound cultures per surgery I&D -As needed IV and p.o. analgesics -Will follow with Dr. Margrette -anticipating I&D today 7/15 -  Essential hypertension - Blood pressure elevated 1 - Looks like patient supposed to be on Norvasc  at home He has not been taking his meds - Restarting Norvasc  at 10 mg p.o. daily, continue as needed hydralazine   History of stroke - No residual deficit -Patient states he does not take any thing including aspirin  or statin at home - Continue aspirin  - Will check a lipid panel  AKI (acute kidney injury) (HCC) - BUN 26/creatinine 1.40>>> 1.01  - Improved with hydration -Continue gentle IV fluid hydration - Avoid nephrotoxic   Hypokalemia - Serum potassium low at 3.3 >> 3.1  - Monitoring and replacing -Replacing with 40 mEq of KCl   --------------------------------------------------------------------------------------------------------------------------------------- Nutritional status:  The patient's BMI is: Body mass index is 22.66 kg/m. I agree with the assessment and plan as outlined  Cultures; Left hand finger wound cultures pending   ------------------------------------------------------------------------------------------------------------------------------------------------  DVT prophylaxis:  heparin  injection 5,000 Units Start: 06/21/24 2200 TED hose Start: 06/21/24 1633 SCDs Start: 06/21/24 1633   Code Status:   Code Status: Full Code  Family Communication: No family member present at bedside-  -Advance care planning has been discussed.   Admission status:   Status is: Observation The patient remains OBS appropriate and will d/c before 2 midnights.   Disposition: From  - home             Planning for discharge in 1-2 days   Procedures:    No admission procedures for hospital encounter.   Antimicrobials:  Anti-infectives (From admission, onward)    Start     Dose/Rate Route Frequency Ordered Stop   06/22/24 0400  piperacillin -tazobactam (ZOSYN ) IVPB 3.375 g        3.375 g 12.5 mL/hr over 240 Minutes Intravenous Every 8 hours 06/21/24 1644     06/21/24 1800  linezolid  (ZYVOX ) IVPB 600 mg        600 mg 300 mL/hr over 60 Minutes Intravenous Every 12 hours 06/21/24 1631 07/01/24 2159   06/21/24 1800  piperacillin -tazobactam (ZOSYN ) IVPB 3.375 g       Placed in And Linked Group   3.375 g 100 mL/hr over 30 Minutes Intravenous  Once 06/21/24 1639 06/21/24 2240   06/21/24 1645  piperacillin -tazobactam (ZOSYN ) IVPB 3.375 g  Status:  Discontinued       Placed in And Linked Group   3.375 g 100 mL/hr over 30 Minutes Intravenous  Once 06/21/24 1631 06/21/24 1639   06/21/24 1500  Ampicillin -Sulbactam (UNASYN ) 3 g in sodium chloride  0.9 % 100 mL IVPB        3 g 200 mL/hr over 30 Minutes Intravenous  Once 06/21/24 1456 06/21/24 1631        Medication:   amLODipine   10 mg Oral Daily   aspirin  EC  81 mg Oral Daily   heparin   5,000 Units Subcutaneous Q8H   senna-docusate  1 tablet Oral QHS   sodium chloride  flush  3 mL Intravenous Q12H   sodium chloride  flush  3 mL Intravenous Q12H    acetaminophen  **OR** acetaminophen , bisacodyl , hydrALAZINE , HYDROmorphone  (DILAUDID ) injection, ipratropium, levalbuterol , ondansetron  **OR** ondansetron  (ZOFRAN ) IV, oxyCODONE , zolpidem    Objective:   Vitals:   06/21/24 1737 06/21/24 2123 06/22/24 0116 06/22/24 0339  BP: (!) 177/73 (!) 160/81 (!) 154/82 (!) 164/69  Pulse: 66 75 66 63  Resp: 20 18 16 16   Temp:  99.2 F (37.3 C) 98.1 F (36.7 C) 98.3 F (36.8 C)  TempSrc:  Oral Oral Oral  SpO2: 100% 97% 99% 98%  Weight:    69.6 kg  Height:        Intake/Output Summary (Last 24 hours) at 06/22/2024 1054 Last data filed at 06/22/2024 9372 Gross per 24 hour  Intake 983.34 ml   Output --  Net 983.34 ml   Filed Weights   06/21/24 1421 06/21/24 1724 06/22/24 0339  Weight: 71.7 kg 69.3 kg 69.6 kg     Physical examination:   General:  AAO x 3,  cooperative, no distress;   HEENT:  Normocephalic, PERRL, otherwise with in Normal limits   Neuro:  CNII-XII intact. , normal motor and sensation, reflexes intact   Lungs:   Clear to auscultation BL, Respirations unlabored,  No wheezes / crackles  Cardio:    S1/S2, RRR, No murmure, No Rubs or Gallops   Abdomen:  Soft, non-tender, bowel sounds active all four quadrants, no guarding or peritoneal signs.  Muscular  skeletal:  Left hand erythema edema 2nd and 3rd digit.  Left index finger laceration with necrotic wound -Left hand cannot make a fist due to edema erythema of the fingers and hand Limited exam -global generalized weaknesses - in bed, able to move all 4 extremities,   2+ pulses,  symmetric, No pitting edema  Skin:  Dry, warm to touch, negative for any Rashes,  Wounds: Please see nursing documentation       ------------------------------------------------------------------------------------------------------------------------------------------    LABs:     Latest Ref Rng & Units 06/22/2024    4:27 AM 06/21/2024    3:09 PM 04/22/2024   10:19 AM  CBC  WBC 4.0 - 10.5 K/uL 11.3  11.4  8.5   Hemoglobin 13.0 - 17.0 g/dL 87.4  86.4  85.9   Hematocrit 39.0 - 52.0 % 37.4  40.7  40.4   Platelets 150 - 400 K/uL 265  289  304       Latest Ref Rng & Units 06/22/2024    4:27 AM 06/21/2024    3:09 PM 04/22/2024   10:19 AM  CMP  Glucose 70 - 99 mg/dL 896  85  881   BUN 8 - 23 mg/dL 17  26  22    Creatinine 0.61 - 1.24 mg/dL 8.98  8.59  8.80   Sodium 135 - 145 mmol/L 138  140  144   Potassium 3.5 - 5.1 mmol/L 3.1  3.3  3.6   Chloride 98 - 111 mmol/L 102  99  107   CO2 22 - 32 mmol/L 26  27  24    Calcium  8.9 - 10.3 mg/dL 8.2  8.6  8.5   Total Protein 6.0 - 8.5 g/dL   5.6   Total Bilirubin 0.0 - 1.2 mg/dL    0.3   Alkaline Phos 44 - 121 IU/L   88   AST 0 - 40 IU/L   17   ALT 0 - 44 IU/L   13        Micro Results Recent Results (from the past 240 hours)  Culture, blood (Routine X 2) w Reflex to ID Panel     Status: None (Preliminary result)   Collection Time: 06/21/24  3:09 PM   Specimen: Right Antecubital; Blood  Result Value Ref Range Status   Specimen Description   Final    RIGHT ANTECUBITAL BOTTLES DRAWN AEROBIC AND ANAEROBIC   Special Requests Blood Culture adequate volume  Final   Culture  Setup Time   Final    AEROBIC BOTTLE ONLY GRAM POSITIVE COCCI IN CLUSTERS Gram Stain Report Called to,Read Back By and Verified With: B GALLOWAY AT 0806 ON 07.15.25 BY ADGER J Performed at [facility], [address], [place], [place] [address]    Culture [facility] POSITIVE COCCI  Final   Report Status PENDING  Incomplete  Culture, blood (Routine X 2) w Reflex to ID Panel     Status: None (Preliminary result)   Collection Time: 06/21/24  5:17 PM   Specimen: Right Antecubital; Blood  Result Value Ref Range Status   Specimen Description RIGHT ANTECUBITAL AEROBIC BOTTLE ONLY  Final   Special Requests   Final    Blood Culture results may not be optimal due to an inadequate volume of blood received in culture bottles   Culture   Final    NO GROWTH < 24 HOURS Performed at [facility], [address], [place], [place] [address]    Report Status PENDING  Incomplete    Radiology Reports DG Hand Complete Left Result Date: 06/21/2024 CLINICAL DATA:  Left hand redness and swelling after possible bite. EXAM: LEFT HAND - COMPLETE 3+ VIEW COMPARISON:  None Available. FINDINGS: There is no evidence of fracture or dislocation. There is no evidence of arthropathy or other focal bone abnormality. Diffuse soft tissue swelling of left index finger is noted with associated laceration. Multiple small rounded densities are noted in the wound in dorsal soft tissues suggesting debris or foreign bodies. IMPRESSION: No  fracture or dislocation is noted. Diffuse soft tissue swelling of left index finger is noted with associated laceration suggesting cellulitis. Multiple small rounded densities are noted in the wound in dorsal soft tissues of the left index finger suggesting debris or foreign bodies. Electronically Signed   By: Lynwood Landy Raddle M.D.   On: 06/21/2024 15:24    SIGNED: Adriana DELENA Grams, MD, FHM. FAAFP. Jolynn Pack - [facility] Time spent - 55 min.  In seeing, evaluating and examining the patient. Reviewing medical records, labs, drawn plan of care. [facility],  Pager (please use [email or web address] to page/ text) Please use Epic Secure Chat for non-urgent communication (7AM-7PM)  If 7PM-7AM, please contact night-coverage [email or web address], 06/22/2024, 10:54 AM

## 2024-06-22 NOTE — Brief Op Note (Signed)
 06/22/2024  4:02 PM  PATIENT:  Jason Walsh  63 y.o. male  PRE-OPERATIVE DIAGNOSIS:  tenosynovitis left long finger, necrosis left index finger  POST-OPERATIVE DIAGNOSIS:  tenosynovitis left long finger, necrosis left index finger  PROCEDURE:  Procedure(s) with comments: INCISION, TENDON SHEATH (Left) - incision and drainage left middle/long finger and debridement left index finger  SURGEON:  Surgeons and Role:    DEWAINE Margrette Taft FORBES, MD - Primary  PHYSICIAN ASSISTANT:   ASSISTANTS: none   ANESTHESIA:   general  EBL:  minimal   BLOOD ADMINISTERED:none  DRAINS: none   LOCAL MEDICATIONS USED:  MARCAINE      SPECIMEN:  Source of Specimen:  left long finger   DISPOSITION OF SPECIMEN:  N/A  COUNTS:  YES  TOURNIQUET:   Total Tourniquet Time Documented: Upper Arm (Left) - 36 minutes Total: Upper Arm (Left) - 36 minutes   DICTATION: .Nechama Dictation  PLAN OF CARE: Admit to inpatient   PATIENT DISPOSITION:  PACU - hemodynamically stable.   Delay start of Pharmacological VTE agent (>24hrs) due to surgical blood loss or risk of bleeding: not applicable

## 2024-06-22 NOTE — Anesthesia Preprocedure Evaluation (Addendum)
 Anesthesia Evaluation  Patient identified by MRN, date of birth, ID band Patient awake    Reviewed: Allergy & Precautions, NPO status , Patient's Chart, lab work & pertinent test results  Airway Mallampati: II  TM Distance: >3 FB Neck ROM: Full    Dental  (+) Edentulous Upper, Edentulous Lower   Pulmonary Current Smoker   Pulmonary exam normal breath sounds clear to auscultation       Cardiovascular hypertension, Normal cardiovascular exam Rhythm:Regular Rate:Normal  Echo 01/10/17: Study Conclusions  - Left ventricle: The cavity size was normal. Systolic function was normal. The estimated ejection fraction was 50%. Wall motion was normal; there were no regional wall motion abnormalities. Left ventricular diastolic function parameters were normal. - Aortic valve: Trileaflet; mildly thickened, mildly calcified   leaflets. - Mitral valve: Valve area by pressure half-time: 2.32 cm^2. - Pulmonary arteries: Systolic pressure could not be accurately estimated.   Neuro/Psych  Neuromuscular disease CVA, No Residual Symptoms  negative psych ROS   GI/Hepatic negative GI ROS,,,(+)     substance abuse  cocaine use  Endo/Other  negative endocrine ROS    Renal/GU negative Renal ROS     Musculoskeletal  Right knee septic arthritis   Abdominal   Peds  Hematology negative hematology ROS (+)   Anesthesia Other Findings   Cancer  Reproductive/Obstetrics                              Anesthesia Physical Anesthesia Plan  ASA: 3  Anesthesia Plan: General   Post-op Pain Management:    Induction: Intravenous  PONV Risk Score and Plan: 2 and Ondansetron , Dexamethasone  and Midazolam   Airway Management Planned: LMA  Additional Equipment: None  Intra-op Plan:   Post-operative Plan:   Informed Consent: I have reviewed the patients History and Physical, chart, labs and discussed the procedure  including the risks, benefits and alternatives for the proposed anesthesia with the patient or authorized representative who has indicated his/her understanding and acceptance.     Dental advisory given  Plan Discussed with: CRNA  Anesthesia Plan Comments: (Risks/benefits of general anesthesia discussed with patient including risk of damage to teeth, lips, gum, and tongue, nausea/vomiting, allergic reactions to medications, and the possibility of heart attack, stroke and death.  All patient questions answered.  Patient wishes to proceed.)         Anesthesia Quick Evaluation

## 2024-06-22 NOTE — Progress Notes (Signed)
 OT recommends outpatient OT and pt will likely need outpatient wound care. LCSW discussed with pt but he states he wants to wait and see what is needed when he is ready for d/c. TOC will follow up.    06/22/24 1128  TOC Brief Assessment  Insurance and Status Reviewed  Patient has primary care physician Yes  Home environment has been reviewed Lives with sister.  Prior level of function: Independent.  Prior/Current Home Services No current home services  Social Drivers of Health Review SDOH reviewed no interventions necessary  Readmission risk has been reviewed Yes  Transition of care needs transition of care needs identified, TOC will continue to follow

## 2024-06-22 NOTE — Anesthesia Procedure Notes (Signed)
 Procedure Name: Intubation Date/Time: 06/22/2024 3:05 PM  Performed by: Dartha Meckel, CRNAPre-anesthesia Checklist: Patient identified, Emergency Drugs available, Suction available and Patient being monitored Patient Re-evaluated:Patient Re-evaluated prior to induction Oxygen  Delivery Method: Circle system utilized Preoxygenation: Pre-oxygenation with 100% oxygen  Induction Type: IV induction Ventilation: Mask ventilation without difficulty LMA: LMA inserted LMA Size: 4.0 Tube type: Oral Number of attempts: 1 Airway Equipment and Method: Stylet and Oral airway Placement Confirmation: positive ETCO2 and breath sounds checked- equal and bilateral Tube secured with: Tape Dental Injury: Teeth and Oropharynx as per pre-operative assessment

## 2024-06-22 NOTE — Transfer of Care (Signed)
 Immediate Anesthesia Transfer of Care Note  Patient: TAGG EUSTICE  Procedure(s) Performed: INCISION, TENDON SHEATH (Left: Hand)  Patient Location: PACU  Anesthesia Type:General  Level of Consciousness: awake and alert   Airway & Oxygen  Therapy: Patient Spontanous Breathing and Patient connected to nasal cannula oxygen   Post-op Assessment: Report given to RN and Post -op Vital signs reviewed and stable  Post vital signs: Reviewed and stable  Last Vitals:  Vitals Value Taken Time  BP    Temp    Pulse 69 06/22/24 16:14  Resp 9 06/22/24 16:14  SpO2 100 % 06/22/24 16:14  Vitals shown include unfiled device data.  Last Pain:  Vitals:   06/22/24 1325  TempSrc:   PainSc: 0-No pain         Complications: No notable events documented.

## 2024-06-22 NOTE — Interval H&P Note (Signed)
 History and Physical Interval Note:  06/22/2024 2:36 PM  Jason Walsh  has presented today for surgery, with the diagnosis of tenosynovitis left long finger, necrosis left index finger.  The various methods of treatment have been discussed with the patient and family. After consideration of risks, benefits and other options for treatment, the patient has consented to  Procedure(s) with comments: INCISION, TENDON SHEATH (Left) - incision and drainage left middle/long finger and debridement left index finger as a surgical intervention.  The patient's history has been reviewed, patient examined, no change in status, stable for surgery.  I have reviewed the patient's chart and labs.  Questions were answered to the patient's satisfaction.     [name]

## 2024-06-22 NOTE — Plan of Care (Signed)
   Problem: Activity: Goal: Risk for activity intolerance will decrease Outcome: Progressing   Problem: Coping: Goal: Level of anxiety will decrease Outcome: Progressing

## 2024-06-22 NOTE — Op Note (Addendum)
 06/22/2024  4:02 PM  PATIENT:  Jason Walsh  63 y.o. male  PRE-OPERATIVE DIAGNOSIS:  tenosynovitis left long finger, necrosis left index finger  POST-OPERATIVE DIAGNOSIS:  tenosynovitis left long finger, necrosis left index finger  PROCEDURE:  Procedure(s) with comments: INCISION, TENDON SHEATH (Left) - incision and drainage left middle/long finger and debridement left index finger  #1  Left long finger flexor tenosynovitis infectious pus under the sheath  #2 necrotic edges dorsal aspect left index finger.  Skin debridement was performed 2 sutures were removed.  The scabbed portion was left in place to protect the integrity of the tendon  Postop plan Continue culture specific antibiotics when available Broad-spectrum antibiotics until cultures definitive Start active range of motion of the fingers and digits Post discharge we will seek hand surgery and/or plastic surgery consult for coverage of the index finger  Details of procedure  Jason Walsh was brought back to surgery for general anesthesia.  His left index and long finger hand wrist elbow and forearm were prepped and draped sterilely  Timeout was completed  The limb was exsanguinated with the Fornage Esmarch and tourniquet was elevated to 250 mmHg  I began with the long finger.  A V-shaped incision was made centered over the apex of the radial aspect of the PIP joint this was lifted up as a full-thickness flap.  Purulent material was noticed in the subcutaneous tissue.  The fluid was cultured.  The distal flexor sheath was opened and the hand was milked from proximal to distal and purulent material was in the flexor sheath.  I then made an incision over the A1 pulley proximally I protected the neurovascular bundle and opened the A1 pulley.  I inserted an Angiocath with a an 18-gauge catheter and then injected the area distally until clear fluid was noted.  The wound was irrigated and closed with 3-0 nylon suture in  multiple interrupted fashion  I then turned my attention to the index finger.  I took out all the sutures I debrided the edges.  There was a large scab portion that was covering the tendon and was left intact  Digital block was performed for the long finger  Dressings were applied tourniquet was released color and capillary refill were excellent on return  Dressing was applied  The patient was extubated and taken to recovery room in stable condition SURGEON:  Surgeons and Role:    * Margrette Taft BRAVO, MD - Primary  PHYSICIAN ASSISTANT:   ASSISTANTS: none   ANESTHESIA:   general  EBL:  minimal   BLOOD ADMINISTERED:none  DRAINS: none   LOCAL MEDICATIONS USED:  MARCAINE      SPECIMEN:  Source of Specimen:  left long finger   DISPOSITION OF SPECIMEN:  N/A  COUNTS:  YES  TOURNIQUET:   Total Tourniquet Time Documented: Upper Arm (Left) - 36 minutes Total: Upper Arm (Left) - 36 minutes   DICTATION: .Nechama Dictation  PLAN OF CARE: Admit to inpatient   PATIENT DISPOSITION:  PACU - hemodynamically stable.   Delay start of Pharmacological VTE agent (>24hrs) due to surgical blood loss or risk of bleeding: not applicable  The end

## 2024-06-22 NOTE — Evaluation (Signed)
 Physical Therapy Evaluation Patient Details Name: Jason Walsh MRN: [id number] DOB: 30-Aug-1961 Today's Date: 06/22/2024  History of Present Illness  Jason Walsh is a 69 with no significant past medical history with exception of skin cancer chronic on left ear presented to the ED with a chief complaint of erythema edema to his left index finger.  About 9 days ago on July 5 he had a accidental laceration with tablesaw to the dorsal aspect of the finger.  At the time laceration was sutured dressed..  Now is progressively worsening with edema erythema was extending to his 2nd and 3rd digit onto his palm and hand area.  Third index pending there is edematous unable to flex or extend due to severe pain.  Second digit the wound is opened up and draining.  Denies having any fever.   Clinical Impression  Patient agreeable to and tolerated OT/PT evaluation well. On this date, patient was independent with all bed mobility, functional transfers, and ambulation. During ambulation, pt demonstrated slight imbalance mainly due to footwear catching floor. Otherwise, patient demonstrated good velocity throughout and reports as his baseline. Patient biggest concern and limitation at this time being finger wounds in L hand but this does not limit his functional mobility. Patient does not present with urgent need for skilled physical therapy at this time. Patient discharged to care of nursing for ambulation daily as tolerated for length of stay.      If plan is discharge home, recommend the following: A little help with bathing/dressing/bathroom;Assistance with cooking/housework   Can travel by private vehicle        Equipment Recommendations None recommended by PT  Recommendations for Other Services       Functional Status Assessment Patient has had a recent decline in their functional status and demonstrates the ability to make significant improvements in function in a reasonable and predictable amount of  time.     Precautions / Restrictions Precautions Precautions: None Recall of Precautions/Restrictions: Intact Restrictions Weight Bearing Restrictions Per Provider Order: No      Mobility  Bed Mobility Overal bed mobility: Independent      Transfers Overall transfer level: Independent        Ambulation/Gait Ambulation/Gait assistance: Independent Gait Distance (Feet): 150 Feet Assistive device: None Gait Pattern/deviations: Step-through pattern, Decreased dorsiflexion - left, Decreased dorsiflexion - right Gait velocity: WFL     General Gait Details: Ambulates w/o AD, dec DF at times and footwear (slides) causing mild imbalance throughout gait trial  Stairs        Wheelchair Mobility     Tilt Bed    Modified Rankin (Stroke Patients Only)       Balance Overall balance assessment: Mild deficits observed, not formally tested           Pertinent Vitals/Pain Pain Assessment Pain Assessment: 0-10 Pain Score: 3  Pain Location: L second and third digits Pain Descriptors / Indicators: Throbbing Pain Intervention(s): Monitored during session, Repositioned    Home Living Family/patient expects to be discharged to:: Private residence Living Arrangements: Other relatives Available Help at Discharge: Other (Comment) (Reports assist most of the time) Type of Home: House Home Access: Ramped entrance       Home Layout: One level Home Equipment: Grab bars - tub/shower      Prior Function Prior Level of Function : Independent/Modified Independent     Mobility Comments: Independent ADLs Comments: Independent     Extremity/Trunk Assessment   Upper Extremity Assessment Upper Extremity Assessment: Defer  to OT evaluation LUE Deficits / Details: Swelling L hand. WFL wrist mobility but only slight movement of 3rd and 4th digit when attempting to make a fist. Wound noted on L index finger. Limited fine motor and grasp based on observations today. LUE  Coordination: decreased fine motor    Lower Extremity Assessment Lower Extremity Assessment: Overall WFL for tasks assessed (LE MMT grossly 4 to 4+/5)    Cervical / Trunk Assessment Cervical / Trunk Assessment: Normal  Communication   Communication Communication: No apparent difficulties    Cognition Arousal: Alert Behavior During Therapy: WFL for tasks assessed/performed   PT - Cognitive impairments: No apparent impairments       Following commands: Intact       Cueing Cueing Techniques: Verbal cues     General Comments      Exercises     Assessment/Plan    PT Assessment Patient does not need any further PT services  PT Problem List         PT Treatment Interventions      PT Goals (Current goals can be found in the Care Plan section)  Acute Rehab PT Goals Patient Stated Goal: Return home with appropriate support following surgery of hand PT Goal Formulation: With patient Time For Goal Achievement: 06/25/24 Potential to Achieve Goals: Good    Frequency       Co-evaluation PT/OT/SLP Co-Evaluation/Treatment: Yes Reason for Co-Treatment: To address functional/ADL transfers PT goals addressed during session: Mobility/safety with mobility OT goals addressed during session: ADL's and self-care       AM-PAC PT 6 Clicks Mobility  Outcome Measure Help needed turning from your back to your side while in a flat bed without using bedrails?: None Help needed moving from lying on your back to sitting on the side of a flat bed without using bedrails?: None Help needed moving to and from a bed to a chair (including a wheelchair)?: None Help needed standing up from a chair using your arms (e.g., wheelchair or bedside chair)?: None Help needed to walk in hospital room?: None Help needed climbing 3-5 steps with a railing? : A Little 6 Click Score: 23    End of Session   Activity Tolerance: Patient tolerated treatment well Patient left: in bed;with call  bell/phone within reach   PT Visit Diagnosis: Unsteadiness on feet (R26.81)    Time: 9152-9144 PT Time Calculation (min) (ACUTE ONLY): 8 min   Charges:   PT Evaluation $PT Eval Low Complexity: 1 Low PT Treatments $Therapeutic Activity: 8-22 mins PT General Charges $$ ACUTE PT VISIT: 1 Visit       11:55 AM, 06/22/24 Rosaria Settler, PT, DPT [facility] [place]

## 2024-06-22 NOTE — Progress Notes (Signed)
 PHARMACY - PHYSICIAN COMMUNICATION CRITICAL VALUE ALERT - BLOOD CULTURE IDENTIFICATION (BCID)  Jason Walsh is an 63 y.o. male who presented to [facility] on 06/21/2024   Assessment:  BCID staph epi in 1/4 bottles- probable contaminant   Current antibiotics: Zyvox /Zosyn   Changes to prescribed antibiotics recommended:  Patient is on recommended antibiotics - No changes needed  Results for orders placed or performed during the hospital encounter of 06/21/24  Blood Culture ID Panel (Reflexed) (Collected: 06/21/2024  3:09 PM)  Result Value Ref Range   Enterococcus faecalis NOT DETECTED NOT DETECTED   Enterococcus Faecium NOT DETECTED NOT DETECTED   Listeria monocytogenes NOT DETECTED NOT DETECTED   Staphylococcus species DETECTED (A) NOT DETECTED   Staphylococcus aureus (BCID) NOT DETECTED NOT DETECTED   Staphylococcus epidermidis DETECTED (A) NOT DETECTED   Staphylococcus lugdunensis NOT DETECTED NOT DETECTED   Streptococcus species NOT DETECTED NOT DETECTED   Streptococcus agalactiae NOT DETECTED NOT DETECTED   Streptococcus pneumoniae NOT DETECTED NOT DETECTED   Streptococcus pyogenes NOT DETECTED NOT DETECTED   A.calcoaceticus-baumannii NOT DETECTED NOT DETECTED   Bacteroides fragilis NOT DETECTED NOT DETECTED   Enterobacterales NOT DETECTED NOT DETECTED   Enterobacter cloacae complex NOT DETECTED NOT DETECTED   Escherichia coli NOT DETECTED NOT DETECTED   Klebsiella aerogenes NOT DETECTED NOT DETECTED   Klebsiella oxytoca NOT DETECTED NOT DETECTED   Klebsiella pneumoniae NOT DETECTED NOT DETECTED   Proteus species NOT DETECTED NOT DETECTED   Salmonella species NOT DETECTED NOT DETECTED   Serratia marcescens NOT DETECTED NOT DETECTED   Haemophilus influenzae NOT DETECTED NOT DETECTED   Neisseria meningitidis NOT DETECTED NOT DETECTED   Pseudomonas aeruginosa NOT DETECTED NOT DETECTED   Stenotrophomonas maltophilia NOT DETECTED NOT DETECTED   Candida albicans NOT DETECTED NOT  DETECTED   Candida auris NOT DETECTED NOT DETECTED   Candida glabrata NOT DETECTED NOT DETECTED   Candida krusei NOT DETECTED NOT DETECTED   Candida parapsilosis NOT DETECTED NOT DETECTED   Candida tropicalis NOT DETECTED NOT DETECTED   Cryptococcus neoformans/gattii NOT DETECTED NOT DETECTED   Methicillin resistance mecA/C DETECTED (A) NOT DETECTED    [name] 06/22/2024  1:50 PM

## 2024-06-22 NOTE — Assessment & Plan Note (Signed)
-   No residual deficit -Patient states he does not take any thing including aspirin  or statin at home

## 2024-06-22 NOTE — Evaluation (Signed)
**Note Jason-Identified via Obfuscation**  Occupational Therapy Evaluation Patient Details Name: Jason Walsh MRN: [id number] DOB: 1961/04/30 Today's Date: 06/22/2024   History of Present Illness   IZIK BINGMAN is a 39 with no significant past medical history with exception of skin cancer chronic on left ear presented to the ED with a chief complaint of erythema edema to his left index finger.  About 9 days ago on July 5 he had a accidental laceration with tablesaw to the dorsal aspect of the finger.  At the time laceration was sutured dressed..  Now is progressively worsening with edema erythema was extending to his 2nd and 3rd digit onto his palm and hand area.  Third index pending there is edematous unable to flex or extend due to severe pain.  Second digit the wound is opened up and draining.  Denies having any fever. (per MD)     Clinical Impressions Pt agreeable to OT and PT co-evaluation. Pt is independent at baseline. Today pt is limited by L hand swelling and poor fine motor skills. Pt has a wound on his L index finger that will likely require additional outpatient wound care. Pt would also benefit from outpatient OT to regain function in hand. Limited fine motor skills and strength at this time. Pt will benefit from continued OT in the hospital and recommended venue below to increase strength, balance, and endurance for safe ADL's.        If plan is discharge home, recommend the following:   Assistance with cooking/housework     Functional Status Assessment   Patient has had a recent decline in their functional status and demonstrates the ability to make significant improvements in function in a reasonable and predictable amount of time.     Equipment Recommendations   None recommended by OT             Precautions/Restrictions   Precautions Precautions: None Recall of Precautions/Restrictions: Intact Restrictions Weight Bearing Restrictions Per Provider Order: No     Mobility Bed  Mobility Overal bed mobility: Independent                  Transfers Overall transfer level: Independent                        Balance Overall balance assessment: Mild deficits observed, not formally tested                                         ADL either performed or assessed with clinical judgement   ADL Overall ADL's : Modified independent                                       General ADL Comments: Pt may have to use adapted strategies for opening containers and things that typically require B UE use. Limited FM skills in L UE.     Vision Baseline Vision/History: 1 Wears glasses Ability to See in Adequate Light: 1 Impaired Patient Visual Report: No change from baseline Vision Assessment?: No apparent visual deficits     Perception Perception: Not tested       Praxis Praxis: Not tested       Pertinent Vitals/Pain Pain Assessment Pain Assessment: 0-10 Pain Score: 3  Pain Location: L second and third digits Pain Descriptors /  Indicators: Throbbing Pain Intervention(s): Monitored during session, Repositioned     Extremity/Trunk Assessment Upper Extremity Assessment Upper Extremity Assessment: LUE deficits/detail LUE Deficits / Details: Swelling L hand. WFL wrist mobility but only slight movement of 3rd and 4th digit when attempting to make a fist. Wound noted on L index finger. Limited fine motor and grasp based on observations today. LUE Coordination: decreased fine motor   Lower Extremity Assessment Lower Extremity Assessment: Defer to PT evaluation   Cervical / Trunk Assessment Cervical / Trunk Assessment: Normal   Communication Communication Communication: No apparent difficulties   Cognition Arousal: Alert Behavior During Therapy: WFL for tasks assessed/performed Cognition: No apparent impairments                               Following commands: Intact       Cueing  General  Comments   Cueing Techniques: Verbal cues                 Home Living Family/patient expects to be discharged to:: Private residence Living Arrangements: Other relatives Available Help at Discharge: Other (Comment) (most the time) Type of Home: House Home Access: Ramped entrance     Home Layout: One level     Bathroom Shower/Tub: Producer, television/film/video: Standard Bathroom Accessibility: Yes How Accessible: Accessible via wheelchair;Accessible via walker Home Equipment: Grab bars - tub/shower          Prior Functioning/Environment Prior Level of Function : Independent/Modified Independent             Mobility Comments: Independent ADLs Comments: Independent    OT Problem List: Decreased strength;Decreased range of motion;Pain;Increased edema   OT Treatment/Interventions: Self-care/ADL training;Therapeutic exercise;Manual therapy;Therapeutic activities;Patient/family education;DME and/or AE instruction      OT Goals(Current goals can be found in the care plan section)   Acute Rehab OT Goals Patient Stated Goal: improve function OT Goal Formulation: With patient Time For Goal Achievement: 07/06/24 Potential to Achieve Goals: Good   OT Frequency:  Min 1X/week    Co-evaluation PT/OT/SLP Co-Evaluation/Treatment: Yes Reason for Co-Treatment: To address functional/ADL transfers   OT goals addressed during session: ADL's and self-care      AM-PAC OT 6 Clicks Daily Activity     Outcome Measure Help from another person eating meals?: None Help from another person taking care of personal grooming?: None Help from another person toileting, which includes using toliet, bedpan, or urinal?: None Help from another person bathing (including washing, rinsing, drying)?: None Help from another person to put on and taking off regular upper body clothing?: None Help from another person to put on and taking off regular lower body clothing?: None 6 Click  Score: 24   End of Session    Activity Tolerance: Patient tolerated treatment well Patient left: in bed;with call bell/phone within reach  OT Visit Diagnosis: Muscle weakness (generalized) (M62.81)                Time: 9158-9144 OT Time Calculation (min): 14 min Charges:  OT General Charges $OT Visit: 1 Visit OT Evaluation $OT Eval Low Complexity: 1 Low  Genice Kimberlin OT, MOT   Jayson Person 06/22/2024, 10:29 AM

## 2024-06-23 ENCOUNTER — Encounter (HOSPITAL_COMMUNITY): Payer: Self-pay | Admitting: Orthopedic Surgery

## 2024-06-23 DIAGNOSIS — L03012 Cellulitis of left finger: Secondary | ICD-10-CM | POA: Diagnosis not present

## 2024-06-23 LAB — CBC
HCT: 36.1 % — ABNORMAL LOW (ref 39.0–52.0)
Hemoglobin: 12.4 g/dL — ABNORMAL LOW (ref 13.0–17.0)
MCH: 34.1 pg — ABNORMAL HIGH (ref 26.0–34.0)
MCHC: 34.3 g/dL (ref 30.0–36.0)
MCV: 99.2 fL (ref 80.0–100.0)
Platelets: 326 K/uL (ref 150–400)
RBC: 3.64 MIL/uL — ABNORMAL LOW (ref 4.22–5.81)
RDW: 12 % (ref 11.5–15.5)
WBC: 12.1 K/uL — ABNORMAL HIGH (ref 4.0–10.5)
nRBC: 0 % (ref 0.0–0.2)

## 2024-06-23 LAB — BASIC METABOLIC PANEL WITH GFR
Anion gap: 8 (ref 5–15)
BUN: 23 mg/dL (ref 8–23)
CO2: 27 mmol/L (ref 22–32)
Calcium: 8.3 mg/dL — ABNORMAL LOW (ref 8.9–10.3)
Chloride: 101 mmol/L (ref 98–111)
Creatinine, Ser: 1.18 mg/dL (ref 0.61–1.24)
GFR, Estimated: >60 mL/min (ref >60)
Glucose, Bld: 195 mg/dL — ABNORMAL HIGH (ref 70–99)
Potassium: 3.8 mmol/L (ref 3.5–5.1)
Sodium: 136 mmol/L (ref 135–145)

## 2024-06-23 LAB — GLUCOSE, CAPILLARY: Glucose-Capillary: 110 mg/dL — ABNORMAL HIGH (ref 70–99)

## 2024-06-23 MED ORDER — ENSURE PLUS HIGH PROTEIN PO LIQD
237.0000 mL | Freq: Two times a day (BID) | ORAL | Status: DC
  Administered 2024-06-23 – 2024-06-24 (×3): 237 mL via ORAL

## 2024-06-23 NOTE — Progress Notes (Signed)
 Pt has been sleeping/resting most of shift. Nad. Ambulates to bathroom with steady gait.

## 2024-06-23 NOTE — Plan of Care (Signed)
   Problem: Clinical Measurements: Goal: Ability to maintain clinical measurements within normal limits will improve Outcome: Progressing

## 2024-06-23 NOTE — Plan of Care (Signed)

## 2024-06-23 NOTE — Progress Notes (Signed)
 PROGRESS NOTE    Patient: Jason Walsh                            PCP: Cook, [name], DO                    DOB: Aug 04, 1961            DOA: 06/21/2024 FMW:[id number]             DOS: 06/23/2024, 10:25 AM   LOS: 1 day   Date of Service: The patient was seen and examined on 06/23/2024  Subjective:   The patient was seen and examined this morning, hemodynamically stable, no acute distress - Status post I&D 1st and 2nd digit on the left hand by Dr. Margrette on 06/22/2024-tolerated procedure well   Brief Narrative:   Jason Walsh is a 74 with no significant past medical history with exception of skin cancer chronic on left ear presented to the ED with a chief complaint of erythema edema to his left index finger.  About 9 days ago on July 5 he had a accidental laceration with tablesaw to the dorsal aspect of the finger.  At the time laceration was sutured dressed..  Now is progressively worsening with edema erythema was extending to his 2nd and 3rd digit onto his palm and hand area.  Third index pending there is edematous unable to flex or extend due to severe pain.  Second digit the wound is opened up and draining. Denies having any fever.   ED course: Blood pressure (!) 150/76, pulse 64, temperature 98 F (36.7 C), resp. rate 18, height 5' 9 (1.753 m), weight 71.7 kg, SpO2 100%.  Labs reviewed-within normal limits with exception of WBC of 11.4, potassium 3.3, BUN 26, creatinine 1.4, calcium  8.6  Patient was given IV antibiotics Unasyn  EDP Dr. Cleotilde has discussed the case with Dr. Margrette orthopedic with plan to evaluate for possible OR I&D.        Assessment & Plan:   Principal Problem:   Cellulitis Active Problems:   History of stroke   Essential hypertension   Tobacco abuse   Hypokalemia   AKI (acute kidney injury) (HCC)   Cellulitis of finger of left hand   Laceration of left index finger with foreign body and damage to nail     Assessment and Plan: *  Cellulitis -S/p I&D and debridement of left 2nd and 3rd phalanx Tenosynovitis of the left long finger, with necrotic left index finger  --Will follow-up with intraoperative wound cultures -Blood cultures (Staph epidermidis, MRSA_, repeating blood culture 06/23/2024 --Continue with current IV antibiotic Zyvox , Zosyn  7/14 >>  -As needed IV and p.o. analgesics -Will follow with Dr. Margrette -anticipating I&D today 7/15 -  Essential hypertension - Not taking any medication at home - Restarting Norvasc  at 10 mg p.o. daily, continue as needed hydralazine   History of stroke - No residual deficit -Patient states he does not take any thing including aspirin  or statin at home - Continue aspirin  - LDL 81  AKI (acute kidney injury) (HCC) - BUN 26/creatinine 1.40>>> 1.01, 1.18  - Improved with hydration -Continue gentle IV fluid hydration - Avoid nephrotoxic   Hypokalemia - Serum potassium low at 3.3 >> 3.1 >>3.8 - Monitoring and replacing -Replacing with 40 mEq of KCl   --------------------------------------------------------------------------------------------------------------------------------------- Nutritional status:  The patient's BMI is: Body mass index is 22.63 kg/m. I agree with the assessment and  plan as outlined        Cultures; Left hand finger wound cultures pending   ------------------------------------------------------------------------------------------------------------------------------------------------  DVT prophylaxis:  Place TED hose Start: 06/22/24 1720 heparin  injection 5,000 Units Start: 06/21/24 2200 TED hose Start: 06/21/24 1633 SCDs Start: 06/21/24 1633   Code Status:   Code Status: Full Code  Family Communication: No family member present at bedside-  -Advance care planning has been discussed.   Admission status:   Status is: Observation The patient remains OBS appropriate and will d/c before 2 midnights.   Disposition: From  - home  -will return home in 1-2 days             Planning for discharge -waiting for final wound cultures to determine antibiotic course  Procedures:   No admission procedures for hospital encounter.   Antimicrobials:  Anti-infectives (From admission, onward)    Start     Dose/Rate Route Frequency Ordered Stop   06/22/24 0400  piperacillin -tazobactam (ZOSYN ) IVPB 3.375 g        3.375 g 12.5 mL/hr over 240 Minutes Intravenous Every 8 hours 06/21/24 1644     06/21/24 1800  linezolid  (ZYVOX ) IVPB 600 mg        600 mg 300 mL/hr over 60 Minutes Intravenous Every 12 hours 06/21/24 1631 07/01/24 2159   06/21/24 1800  piperacillin -tazobactam (ZOSYN ) IVPB 3.375 g       Placed in And Linked Group   3.375 g 100 mL/hr over 30 Minutes Intravenous  Once 06/21/24 1639 06/21/24 2240   06/21/24 1645  piperacillin -tazobactam (ZOSYN ) IVPB 3.375 g  Status:  Discontinued       Placed in And Linked Group   3.375 g 100 mL/hr over 30 Minutes Intravenous  Once 06/21/24 1631 06/21/24 1639   06/21/24 1500  Ampicillin -Sulbactam (UNASYN ) 3 g in sodium chloride  0.9 % 100 mL IVPB        3 g 200 mL/hr over 30 Minutes Intravenous  Once 06/21/24 1456 06/21/24 1631        Medication:   acetaminophen   1,000 mg Oral Q6H   amLODipine   10 mg Oral Daily   aspirin  EC  81 mg Oral Daily   feeding supplement  237 mL Oral BID BM   heparin   5,000 Units Subcutaneous Q8H   senna-docusate  1 tablet Oral QHS   sodium chloride  flush  3 mL Intravenous Q12H   sodium chloride  flush  3 mL Intravenous Q12H    acetaminophen  **OR** acetaminophen , acetaminophen , bisacodyl , hydrALAZINE , HYDROmorphone  (DILAUDID ) injection, HYDROmorphone , ipratropium, levalbuterol , ondansetron  **OR** ondansetron  (ZOFRAN ) IV, oxyCODONE , zolpidem    Objective:   Vitals:   06/22/24 1725 06/22/24 2051 06/23/24 0500 06/23/24 0634  BP: (!) 188/87 (!) 145/65  (!) 140/66  Pulse: 73 63  (!) 55  Resp: 18 18  18   Temp: (!) 97.4 F (36.3 C) 97.6 F  (36.4 C)  (!) 97.5 F (36.4 C)  TempSrc: Oral Oral  Oral  SpO2: 100% 98%  100%  Weight:   69.5 kg   Height:        Intake/Output Summary (Last 24 hours) at 06/23/2024 1025 Last data filed at 06/23/2024 0847 Gross per 24 hour  Intake 1681.67 ml  Output --  Net 1681.67 ml   Filed Weights   06/21/24 1724 06/22/24 0339 06/23/24 0500  Weight: 69.3 kg 69.6 kg 69.5 kg     Physical examination:        General:  AAO x 3,  cooperative, no distress;   HEENT:  Normocephalic, PERRL, otherwise with in Normal limits   Neuro:  CNII-XII intact. , normal motor and sensation, reflexes intact   Lungs:   Clear to auscultation BL, Respirations unlabored,  No wheezes / crackles  Cardio:    S1/S2, RRR, No murmure, No Rubs or Gallops   Abdomen:  Soft, non-tender, bowel sounds active all four quadrants, no guarding or peritoneal signs.  Muscular  skeletal:  Left hand erythema edema-1st and 2nd digit involvement,  status post I&D, surgical wounds present, dressing in place Positive pulses Limited exam -global generalized weaknesses - in bed, able to move all 4 extremities,   2+ pulses,  symmetric, No pitting edema  Skin:  Dry, warm to touch, negative for any Rashes,  Wounds: Please see nursing documentation           ------------------------------------------------------------------------------------------------------------------------------------------    LABs:     Latest Ref Rng & Units 06/23/2024    4:31 AM 06/22/2024    4:27 AM 06/21/2024    3:09 PM  CBC  WBC 4.0 - 10.5 K/uL 12.1  11.3  11.4   Hemoglobin 13.0 - 17.0 g/dL 87.5  87.4  86.4   Hematocrit 39.0 - 52.0 % 36.1  37.4  40.7   Platelets 150 - 400 K/uL 326  265  289       Latest Ref Rng & Units 06/23/2024    4:31 AM 06/22/2024    4:27 AM 06/21/2024    3:09 PM  CMP  Glucose 70 - 99 mg/dL 804  896  85   BUN 8 - 23 mg/dL 23  17  26    Creatinine 0.61 - 1.24 mg/dL 8.81  8.98  8.59   Sodium 135 - 145 mmol/L 136  138  140    Potassium 3.5 - 5.1 mmol/L 3.8  3.1  3.3   Chloride 98 - 111 mmol/L 101  102  99   CO2 22 - 32 mmol/L 27  26  27    Calcium  8.9 - 10.3 mg/dL 8.3  8.2  8.6        Micro Results Recent Results (from the past 240 hours)  Culture, blood (Routine X 2) w Reflex to ID Panel     Status: None (Preliminary result)   Collection Time: 06/21/24  3:09 PM   Specimen: Right Antecubital; Blood  Result Value Ref Range Status   Specimen Description   Final    RIGHT ANTECUBITAL BOTTLES DRAWN AEROBIC AND ANAEROBIC Performed at [facility], [address] [place], [place] [address]    Special Requests   Final    Blood Culture adequate volume Performed at [facility], [address] [place], [place] [address]    Culture  Setup Time   Final    IN BOTH AEROBIC AND ANAEROBIC BOTTLES GRAM POSITIVE COCCI IN CLUSTERS Gram Stain Report Called to,Read Back By and Verified With: B GALLOWAY AT 0806 ON 07.15.25 BY ADGER J GRAM STAIN REVIEWED-AGREE WITH RESULT DRT CRITICAL RESULT CALLED TO, READ BACK BY AND VERIFIED WITH: PHARMD STEVEN HURTH ON 06/22/24 @ 1331 BY DRT Performed at [facility], [address], [place], [place] [address]    Culture [facility] POSITIVE COCCI  Final   Report Status PENDING  Incomplete  Blood Culture ID Panel (Reflexed)     Status: Abnormal   Collection Time: 06/21/24  3:09 PM  Result Value Ref Range Status   Enterococcus faecalis NOT DETECTED NOT DETECTED Final   Enterococcus Faecium NOT DETECTED NOT DETECTED Final  Listeria monocytogenes NOT DETECTED NOT DETECTED Final   Staphylococcus species DETECTED (A) NOT DETECTED Final    Comment: CRITICAL RESULT CALLED TO, READ BACK BY AND VERIFIED WITH: PHARMD STEVEN HURTH ON 06/22/24 @ 1331 BY DRT    Staphylococcus aureus (BCID) NOT DETECTED NOT DETECTED Final   Staphylococcus epidermidis DETECTED (A) NOT DETECTED Final    Comment: Methicillin (oxacillin) resistant coagulase negative staphylococcus. Possible blood culture  contaminant (unless isolated from more than one blood culture draw or clinical case suggests pathogenicity). No antibiotic treatment is indicated for blood  culture contaminants. CRITICAL RESULT CALLED TO, READ BACK BY AND VERIFIED WITH: PHARMD STEVEN HURTH ON 06/22/24 @ 1331 BY DRT    Staphylococcus lugdunensis NOT DETECTED NOT DETECTED Final   Streptococcus species NOT DETECTED NOT DETECTED Final   Streptococcus agalactiae NOT DETECTED NOT DETECTED Final   Streptococcus pneumoniae NOT DETECTED NOT DETECTED Final   Streptococcus pyogenes NOT DETECTED NOT DETECTED Final   A.calcoaceticus-baumannii NOT DETECTED NOT DETECTED Final   Bacteroides fragilis NOT DETECTED NOT DETECTED Final   Enterobacterales NOT DETECTED NOT DETECTED Final   Enterobacter cloacae complex NOT DETECTED NOT DETECTED Final   Escherichia coli NOT DETECTED NOT DETECTED Final   Klebsiella aerogenes NOT DETECTED NOT DETECTED Final   Klebsiella oxytoca NOT DETECTED NOT DETECTED Final   Klebsiella pneumoniae NOT DETECTED NOT DETECTED Final   Proteus species NOT DETECTED NOT DETECTED Final   Salmonella species NOT DETECTED NOT DETECTED Final   Serratia marcescens NOT DETECTED NOT DETECTED Final   Haemophilus influenzae NOT DETECTED NOT DETECTED Final   Neisseria meningitidis NOT DETECTED NOT DETECTED Final   Pseudomonas aeruginosa NOT DETECTED NOT DETECTED Final   Stenotrophomonas maltophilia NOT DETECTED NOT DETECTED Final   Candida albicans NOT DETECTED NOT DETECTED Final   Candida auris NOT DETECTED NOT DETECTED Final   Candida glabrata NOT DETECTED NOT DETECTED Final   Candida krusei NOT DETECTED NOT DETECTED Final   Candida parapsilosis NOT DETECTED NOT DETECTED Final   Candida tropicalis NOT DETECTED NOT DETECTED Final   Cryptococcus neoformans/gattii NOT DETECTED NOT DETECTED Final   Methicillin resistance mecA/C DETECTED (A) NOT DETECTED Final    Comment: CRITICAL RESULT CALLED TO, READ BACK BY AND VERIFIED  WITH: PHARMD STEVEN HURTH ON 06/22/24 @ 1331 BY DRT Performed at [facility], [address], [place], [place] [address]   Culture, blood (Routine X 2) w Reflex to ID Panel     Status: None (Preliminary result)   Collection Time: 06/21/24  5:17 PM   Specimen: Right Antecubital; Blood  Result Value Ref Range Status   Specimen Description RIGHT ANTECUBITAL AEROBIC BOTTLE ONLY  Final   Special Requests   Final    Blood Culture results may not be optimal due to an inadequate volume of blood received in culture bottles   Culture   Final    NO GROWTH 2 DAYS Performed at [facility], [address] [place], [place] [address]    Report Status PENDING  Incomplete  Aerobic/Anaerobic Culture w Gram Stain (surgical/deep wound)     Status: None (Preliminary result)   Collection Time: 06/22/24  3:41 PM   Specimen: Finger, Left; Wound  Result Value Ref Range Status   Specimen Description   Final    WOUND Performed at [facility], [address], [place], [place] [address]    Special Requests LEFT LING FINGER [facility] A  Final   Gram Stain   Final    RARE WBC  PRESENT, PREDOMINANTLY PMN RARE GRAM POSITIVE COCCI Performed at [facility], [address], [place], [place] [address]    Culture PENDING  Incomplete   Report Status PENDING  Incomplete    Radiology Reports No results found.   SIGNED: Adriana DELENA Grams, MD, FHM. FAAFP. Jolynn Pack - [facility] Time spent - 55 min.  In seeing, evaluating and examining the patient. Reviewing medical records, labs, drawn plan of care. [facility],  Pager (please use [email or web address] to page/ text) Please use Epic Secure Chat for non-urgent communication (7AM-7PM)  If 7PM-7AM, please contact night-coverage [email or web address], 06/23/2024, 10:25 AM

## 2024-06-24 DIAGNOSIS — E7800 Pure hypercholesterolemia, unspecified: Secondary | ICD-10-CM | POA: Diagnosis not present

## 2024-06-24 DIAGNOSIS — L03012 Cellulitis of left finger: Secondary | ICD-10-CM | POA: Diagnosis not present

## 2024-06-24 DIAGNOSIS — I10 Essential (primary) hypertension: Secondary | ICD-10-CM

## 2024-06-24 LAB — CULTURE, BLOOD (ROUTINE X 2): Special Requests: ADEQUATE

## 2024-06-24 LAB — CBC
HCT: 39.5 % (ref 39.0–52.0)
Hemoglobin: 13.2 g/dL (ref 13.0–17.0)
MCH: 34 pg (ref 26.0–34.0)
MCHC: 33.4 g/dL (ref 30.0–36.0)
MCV: 101.8 fL — ABNORMAL HIGH (ref 80.0–100.0)
Platelets: 283 K/uL (ref 150–400)
RBC: 3.88 MIL/uL — ABNORMAL LOW (ref 4.22–5.81)
RDW: 12.3 % (ref 11.5–15.5)
WBC: 7.8 K/uL (ref 4.0–10.5)
nRBC: 0 % (ref 0.0–0.2)

## 2024-06-24 LAB — GLUCOSE, CAPILLARY: Glucose-Capillary: 93 mg/dL (ref 70–99)

## 2024-06-24 LAB — BASIC METABOLIC PANEL WITH GFR
Anion gap: 8 (ref 5–15)
BUN: 20 mg/dL (ref 8–23)
CO2: 27 mmol/L (ref 22–32)
Calcium: 8.4 mg/dL — ABNORMAL LOW (ref 8.9–10.3)
Chloride: 104 mmol/L (ref 98–111)
Creatinine, Ser: 1.19 mg/dL (ref 0.61–1.24)
GFR, Estimated: >60 mL/min (ref >60)
Glucose, Bld: 102 mg/dL — ABNORMAL HIGH (ref 70–99)
Potassium: 3.6 mmol/L (ref 3.5–5.1)
Sodium: 139 mmol/L (ref 135–145)

## 2024-06-24 MED ORDER — ATORVASTATIN CALCIUM 40 MG PO TABS: 40.0000 mg | ORAL_TABLET | Freq: Every day | ORAL | 11 refills | Status: DC

## 2024-06-24 MED ORDER — CLINDAMYCIN HCL 300 MG PO CAPS: 300.0000 mg | ORAL_CAPSULE | Freq: Three times a day (TID) | ORAL | 0 refills | Status: DC

## 2024-06-24 MED ORDER — OXYCODONE HCL 5 MG PO TABS: 5.0000 mg | ORAL_TABLET | ORAL | 0 refills | Status: AC | PRN

## 2024-06-24 MED ORDER — ASPIRIN 81 MG PO TBEC: 81.0000 mg | DELAYED_RELEASE_TABLET | Freq: Every day | ORAL | 12 refills | Status: DC

## 2024-06-24 MED ORDER — SULFAMETHOXAZOLE-TRIMETHOPRIM 800-160 MG PO TABS
1.0000 | ORAL_TABLET | Freq: Two times a day (BID) | ORAL | 0 refills | Status: AC
Start: 2024-06-24 — End: 2024-07-01

## 2024-06-24 MED ORDER — AMLODIPINE BESYLATE 10 MG PO TABS: 10.0000 mg | ORAL_TABLET | Freq: Every day | ORAL | 3 refills | Status: DC

## 2024-06-24 MED ORDER — LACTINEX PO CHEW: 1.0000 | CHEWABLE_TABLET | Freq: Three times a day (TID) | ORAL | 0 refills | Status: AC

## 2024-06-24 MED ORDER — ACETAMINOPHEN 325 MG PO TABS: 650.0000 mg | ORAL_TABLET | Freq: Four times a day (QID) | ORAL | 0 refills | Status: AC | PRN

## 2024-06-24 NOTE — Discharge Summary (Addendum)
 Physician Discharge Summary   Patient: Jason Walsh MRN: [id number] DOB: 05-08-1961  Admit date:     06/21/2024  Discharge date: 06/24/24  Discharge Physician: Adriana DELENA Grams   PCP: Cook, [name], DO   Recommendations at discharge:    Follow-up with PCP in 1 week -Follow-up with Dr. Margrette in 1-2 weeks -Continue current prescribed antibiotics Bactrim   Discharge Diagnoses: Principal Problem:   Cellulitis Active Problems:   History of stroke   Essential hypertension   Tobacco abuse   Hypokalemia   AKI (acute kidney injury) (HCC)   Cellulitis of finger of left hand   Laceration of left index finger with foreign body and damage to nail  Resolved Problems:   * No resolved hospital problems. *  Hospital Course: Jason Walsh is a 20 with no significant past medical history with exception of skin cancer chronic on left ear presented to the ED with a chief complaint of erythema edema to his left index finger.  About 9 days ago on July 5 he had a accidental laceration with tablesaw to the dorsal aspect of the finger.  At the time laceration was sutured dressed..  Now is progressively worsening with edema erythema was extending to his 2nd and 3rd digit onto his palm and hand area.  Third index pending there is edematous unable to flex or extend due to severe pain.  Second digit the wound is opened up and draining. Denies having any fever.   ED course: Blood pressure (!) 150/76, pulse 64, temperature 98 F (36.7 C), resp. rate 18, height 5' 9 (1.753 m), weight 71.7 kg, SpO2 100%.  Labs reviewed-within normal limits with exception of WBC of 11.4, potassium 3.3, BUN 26, creatinine 1.4, calcium  8.6  Patient was given IV antibiotics Unasyn  EDP Dr. Cleotilde has discussed the case with Dr. Margrette orthopedic with plan to evaluate for possible OR I&D.     Cellulitis -S/p I&D and debridement of left 2nd and 3rd phalanx Tenosynovitis of the left long finger, with necrotic left index  finger   --Will follow-up with intraoperative wound cultures -Blood cultures (Staph epidermidis, MRSA_, repeating blood culture 06/23/2024 --Continue with current IV antibiotic Zyvox , Zosyn  7/14 >> 06/24/2024 -Wound cultures are growing gram-positive cocci Per Dr. Margrette recommendation started on oral antibiotics Bactrim  -As needed IV and p.o. analgesics -S/p I&D and debridement of index finger 06/22/2024 by Dr. Margrette  -   Essential hypertension - Not taking any medication at home - Restarting Norvasc  at 10 mg p.o. daily, prescription given   History of stroke - No residual deficit -Patient states he does not take any thing including aspirin  or statin at home - Continue aspirin  + statins - LDL 81   Hyperlipidemia - With LDL goal less than 70 - Started Lipitor at 40 mg daily  AKI (acute kidney injury) (HCC) - BUN 26/creatinine 1.40>>> 1.01, 1.18  - Improved with hydration      Hypokalemia - Serum potassium low at 3.3 >> 3.1 >>3.8 - Was replaced accordingly   Pain control - [place]  Controlled Substance Reporting System database was reviewed. and patient was instructed, not to drive, operate heavy machinery, perform activities at heights, swimming or participation in water activities or provide baby-sitting services while on Pain, Sleep and Anxiety Medications; until their outpatient Physician has advised to do so again. Also recommended to not to take more than prescribed Pain, Sleep and Anxiety Medications.  Consultants: Orthopedic Dr. Margrette Procedures performed: S/p I&D index and third finger  left hand Disposition: Home Diet recommendation:  Discharge Diet Orders (From admission, onward)     Start     Ordered   06/24/24 0000  Diet - low sodium heart healthy        06/24/24 1130           Cardiac diet DISCHARGE MEDICATION: Allergies as of 06/24/2024       Reactions   Gabapentin     Pain         Medication List     TAKE these medications     acetaminophen  325 MG tablet Commonly known as: TYLENOL  Take 2 tablets (650 mg total) by mouth every 6 (six) hours as needed for up to 10 days for mild pain (pain score 1-3) or fever (or temp > 100.5).   amLODipine  10 MG tablet Commonly known as: NORVASC  Take 1 tablet (10 mg total) by mouth daily.   aspirin  EC 81 MG tablet Take 1 tablet (81 mg total) by mouth daily. Swallow whole. Start taking on: June 25, 2024   atorvastatin  40 MG tablet Commonly known as: Lipitor Take 1 tablet (40 mg total) by mouth daily.   lactobacillus acidophilus & bulgar chewable tablet Chew 1 tablet by mouth 3 (three) times daily with meals for 10 days.   oxyCODONE  5 MG immediate release tablet Commonly known as: Oxy IR/ROXICODONE  Take 1-2 tablets (5-10 mg total) by mouth every 4 (four) hours as needed for up to 3 days for moderate pain (pain score 4-6).   sulfamethoxazole -trimethoprim  800-160 MG tablet Commonly known as: BACTRIM  DS Take 1 tablet by mouth 2 (two) times daily for 7 days.               Discharge Care Instructions  (From admission, onward)           Start     Ordered   06/24/24 0000  Discharge wound care:       Comments: Keep the wound as a sterile and clean as possible -Dressing change per wound care nursing staff and Dr. Margrette   06/24/24 1130            Follow-up Information     [facility] Follow up.   Specialty: Rehabilitation Why: Please follow up regarding wound care needs with outpatient. Contact information: [address] [place]  [address] [phone]               Discharge Exam: Jason Walsh   06/22/24 0339 06/23/24 0500 06/24/24 0559  Weight: 69.6 kg 69.5 kg 70.7 kg        General:  AAO x 3,  cooperative, no distress;   HEENT:  Normocephalic, PERRL, otherwise with in Normal limits   Neuro:  CNII-XII intact. , normal motor and sensation, reflexes intact   Lungs:    Clear to auscultation BL, Respirations unlabored,  No wheezes / crackles  Cardio:    S1/S2, RRR, No murmure, No Rubs or Gallops   Abdomen:  Soft, non-tender, bowel sounds active all four quadrants, no guarding or peritoneal signs.  Muscular  skeletal:  Improved left hand erythema edema, left 2nd and 3rd digit surgical wound, and dressing Limited exam -global generalized weaknesses - in bed, able to move all 4 extremities,   2+ pulses,  symmetric, No pitting edema  Skin:  Dry, warm to touch, negative for any Rashes,  Wounds: Please see nursing documentation          Condition at discharge:  good  The results of significant diagnostics from this hospitalization (including imaging, microbiology, ancillary and laboratory) are listed below for reference.   Imaging Studies: DG Hand Complete Left Result Date: 06/21/2024 CLINICAL DATA:  Left hand redness and swelling after possible bite. EXAM: LEFT HAND - COMPLETE 3+ VIEW COMPARISON:  None Available. FINDINGS: There is no evidence of fracture or dislocation. There is no evidence of arthropathy or other focal bone abnormality. Diffuse soft tissue swelling of left index finger is noted with associated laceration. Multiple small rounded densities are noted in the wound in dorsal soft tissues suggesting debris or foreign bodies. IMPRESSION: No fracture or dislocation is noted. Diffuse soft tissue swelling of left index finger is noted with associated laceration suggesting cellulitis. Multiple small rounded densities are noted in the wound in dorsal soft tissues of the left index finger suggesting debris or foreign bodies. Electronically Signed   By: Lynwood Landy Raddle M.D.   On: 06/21/2024 15:24   DG Finger Index Right Result Date: 06/12/2024 CLINICAL DATA:  Index finger laceration EXAM: RIGHT INDEX FINGER 2+V COMPARISON:  None Available. FINDINGS: Soft tissue laceration noted in the right index finger near the PIP joint. Small radiopaque densities are  noted in the soft tissues posterior to the PIP joint, likely small radiopaque foreign bodies. No fracture, subluxation or dislocation. IMPRESSION: Small radiopaque foreign bodies posterior to the right index finger PIP joint. No acute bony abnormality. Electronically Signed   By: Franky Crease M.D.   On: 06/12/2024 21:28    Microbiology: Results for orders placed or performed during the hospital encounter of 06/21/24  Culture, blood (Routine X 2) w Reflex to ID Panel     Status: Abnormal   Collection Time: 06/21/24  3:09 PM   Specimen: Right Antecubital; Blood  Result Value Ref Range Status   Specimen Description   Final    RIGHT ANTECUBITAL BOTTLES DRAWN AEROBIC AND ANAEROBIC Performed at [facility], [address], [place], [place] [address]    Special Requests   Final    Blood Culture adequate volume Performed at [facility], [address] [place], [place] [address]    Culture  Setup Time   Final    IN BOTH AEROBIC AND ANAEROBIC BOTTLES GRAM POSITIVE COCCI IN CLUSTERS Gram Stain Report Called to,Read Back By and Verified With: B GALLOWAY AT 0806 ON 07.15.25 BY ADGER J GRAM STAIN REVIEWED-AGREE WITH RESULT DRT CRITICAL RESULT CALLED TO, READ BACK BY AND VERIFIED WITH: PHARMD STEVEN HURTH ON 06/22/24 @ 1331 BY DRT    Culture (A)  Final    STAPHYLOCOCCUS HOMINIS STAPHYLOCOCCUS EPIDERMIDIS THE SIGNIFICANCE OF ISOLATING THIS ORGANISM FROM A SINGLE SET OF BLOOD CULTURES WHEN MULTIPLE SETS ARE DRAWN IS UNCERTAIN. PLEASE NOTIFY THE MICROBIOLOGY DEPARTMENT WITHIN ONE WEEK IF SPECIATION AND SENSITIVITIES ARE REQUIRED. Performed at [facility], [address] [place], [place] [address]    Report Status 06/24/2024 FINAL  Final  Blood Culture ID Panel (Reflexed)     Status: Abnormal   Collection Time: 06/21/24  3:09 PM  Result Value Ref Range Status   Enterococcus faecalis NOT DETECTED NOT DETECTED Final   Enterococcus Faecium NOT DETECTED NOT DETECTED Final   Listeria  monocytogenes NOT DETECTED NOT DETECTED Final   Staphylococcus species DETECTED (A) NOT DETECTED Final    Comment: CRITICAL RESULT CALLED TO, READ BACK BY AND VERIFIED WITH: PHARMD STEVEN HURTH ON 06/22/24 @ 1331 BY DRT    Staphylococcus aureus (BCID) NOT DETECTED NOT DETECTED  Final   Staphylococcus epidermidis DETECTED (A) NOT DETECTED Final    Comment: Methicillin (oxacillin) resistant coagulase negative staphylococcus. Possible blood culture contaminant (unless isolated from more than one blood culture draw or clinical case suggests pathogenicity). No antibiotic treatment is indicated for blood  culture contaminants. CRITICAL RESULT CALLED TO, READ BACK BY AND VERIFIED WITH: PHARMD STEVEN HURTH ON 06/22/24 @ 1331 BY DRT    Staphylococcus lugdunensis NOT DETECTED NOT DETECTED Final   Streptococcus species NOT DETECTED NOT DETECTED Final   Streptococcus agalactiae NOT DETECTED NOT DETECTED Final   Streptococcus pneumoniae NOT DETECTED NOT DETECTED Final   Streptococcus pyogenes NOT DETECTED NOT DETECTED Final   A.calcoaceticus-baumannii NOT DETECTED NOT DETECTED Final   Bacteroides fragilis NOT DETECTED NOT DETECTED Final   Enterobacterales NOT DETECTED NOT DETECTED Final   Enterobacter cloacae complex NOT DETECTED NOT DETECTED Final   Escherichia coli NOT DETECTED NOT DETECTED Final   Klebsiella aerogenes NOT DETECTED NOT DETECTED Final   Klebsiella oxytoca NOT DETECTED NOT DETECTED Final   Klebsiella pneumoniae NOT DETECTED NOT DETECTED Final   Proteus species NOT DETECTED NOT DETECTED Final   Salmonella species NOT DETECTED NOT DETECTED Final   Serratia marcescens NOT DETECTED NOT DETECTED Final   Haemophilus influenzae NOT DETECTED NOT DETECTED Final   Neisseria meningitidis NOT DETECTED NOT DETECTED Final   Pseudomonas aeruginosa NOT DETECTED NOT DETECTED Final   Stenotrophomonas maltophilia NOT DETECTED NOT DETECTED Final   Candida albicans NOT DETECTED NOT DETECTED Final    Candida auris NOT DETECTED NOT DETECTED Final   Candida glabrata NOT DETECTED NOT DETECTED Final   Candida krusei NOT DETECTED NOT DETECTED Final   Candida parapsilosis NOT DETECTED NOT DETECTED Final   Candida tropicalis NOT DETECTED NOT DETECTED Final   Cryptococcus neoformans/gattii NOT DETECTED NOT DETECTED Final   Methicillin resistance mecA/C DETECTED (A) NOT DETECTED Final    Comment: CRITICAL RESULT CALLED TO, READ BACK BY AND VERIFIED WITH: PHARMD STEVEN HURTH ON 06/22/24 @ 1331 BY DRT Performed at [facility], [address] [place], [place] [address]   Culture, blood (Routine X 2) w Reflex to ID Panel     Status: None (Preliminary result)   Collection Time: 06/21/24  5:17 PM   Specimen: Right Antecubital; Blood  Result Value Ref Range Status   Specimen Description RIGHT ANTECUBITAL AEROBIC BOTTLE ONLY  Final   Special Requests   Final    Blood Culture results may not be optimal due to an inadequate volume of blood received in culture bottles   Culture   Final    NO GROWTH 3 DAYS Performed at [facility], [address], [place], [place] [address]    Report Status PENDING  Incomplete  Aerobic/Anaerobic Culture w Gram Stain (surgical/deep wound)     Status: None (Preliminary result)   Collection Time: 06/22/24  3:41 PM   Specimen: Finger, Left; Wound  Result Value Ref Range Status   Specimen Description   Final    WOUND Performed at [facility], [address], [place], [place] [address]    Special Requests LEFT LING FINGER SPEC A  Final   Gram Stain   Final    RARE WBC PRESENT, PREDOMINANTLY PMN RARE GRAM POSITIVE COCCI    Culture   Final    RARE STAPHYLOCOCCUS AUREUS SUSCEPTIBILITIES TO FOLLOW Performed at [facility], [address], [place], [place] [address]    Report Status PENDING  Incomplete  Culture, blood (Routine X 2)  w Reflex to ID Panel     Status: None (Preliminary result)   Collection Time: 06/23/24  8:53 AM   Specimen: BLOOD  Result  Value Ref Range Status   Specimen Description BLOOD LEFT ASSIST CONTROL  Final   Special Requests   Final    BOTTLES DRAWN AEROBIC AND ANAEROBIC Blood Culture adequate volume   Culture   Final    NO GROWTH < 24 HOURS Performed at [facility], [address], [place], [place] [address]    Report Status PENDING  Incomplete  Culture, blood (Routine X 2) w Reflex to ID Panel     Status: None (Preliminary result)   Collection Time: 06/23/24 10:59 AM   Specimen: BLOOD  Result Value Ref Range Status   Specimen Description BLOOD BLOOD RIGHT ARM  Final   Special Requests   Final    BOTTLES DRAWN AEROBIC AND ANAEROBIC Blood Culture adequate volume   Culture   Final    NO GROWTH < 24 HOURS Performed at [facility], [address], [place], [place] [address]    Report Status PENDING  Incomplete    Labs: CBC: Recent Labs  Lab 06/21/24 1509 06/22/24 0427 06/23/24 0431 06/24/24 0430  WBC 11.4* 11.3* 12.1* 7.8  NEUTROABS 9.2*  --   --   --   HGB 13.5 12.5* 12.4* 13.2  HCT 40.7 37.4* 36.1* 39.5  MCV 100.5* 100.3* 99.2 101.8*  PLT 289 265 326 283   Basic Metabolic Panel: Recent Labs  Lab 06/21/24 1509 06/22/24 0427 06/23/24 0431 06/24/24 0430  NA 140 138 136 139  K 3.3* 3.1* 3.8 3.6  CL 99 102 101 104  CO2 27 26 27 27   GLUCOSE 85 103* 195* 102*  BUN 26* 17 23 20   CREATININE 1.40* 1.01 1.18 1.19  CALCIUM  8.6* 8.2* 8.3* 8.4*  MG 2.5*  --   --   --   PHOS 3.2  --   --   --    Liver Function Tests: No results for input(s): AST, ALT, ALKPHOS, BILITOT, PROT, ALBUMIN in the last 168 hours. CBG: Recent Labs  Lab 06/22/24 0521 06/23/24 0726 06/24/24 0750  GLUCAP 109* 110* 93    Discharge time spent: greater than 30 minutes.  Signed: Adriana DELENA Grams, MD [facility] 06/24/2024

## 2024-06-24 NOTE — TOC Transition Note (Signed)
 Transition of Care [facility]) - Discharge Note   Patient Details  Name: Jason Walsh MRN: [id number] Date of Birth: 13-Jul-1961  Transition of Care [facility]) CM/SW Contact:  Jason Walsh Phone Number: 06/24/2024, 12:32 PM   Clinical Narrative:     Patient is discharging home today. CSW spoke with daughter twice and explained that no HH agency would be able to accept pt due to his insurance and he will need to do OP Wound care. Daughter understood and relieved that pt antibiotic will be oral. Transportation resources were added to assist with getting pt to appointment if family cannot assist. CSW asked MD to co-sign orders for OP Wound care. TOC signing off.    Final next level of care: OP Rehab Barriers to Discharge: Barriers Resolved   Patient Goals and CMS Choice Patient states their goals for this hospitalization and ongoing recovery are:: return home CMS Medicare.gov Compare Post Acute Care list provided to:: Patient Represenative (must comment) ([name])        Discharge Placement                Patient to be transferred to facility by: POV Name of family member notified: Jason Walsh- daughter Patient and family notified of of transfer: 06/24/24  Discharge Plan and Services Additional resources added to the After Visit Summary for                                       Social Drivers of Health (SDOH) Interventions SDOH Screenings   Food Insecurity: No Food Insecurity (06/21/2024)  Housing: Low Risk  (06/21/2024)  Transportation Needs: No Transportation Needs (06/21/2024)  Utilities: Not At Risk (06/21/2024)  Depression (PHQ2-9): Low Risk  (04/21/2024)  Financial Resource Strain: Low Risk  (01/15/2019)  Stress: No Stress Concern Present (01/15/2019)  Tobacco Use: High Risk (06/21/2024)     Readmission Risk Interventions    06/24/2024   12:30 PM  Readmission Risk Prevention Plan  Medication Screening Complete  Transportation Screening Complete

## 2024-06-24 NOTE — Discharge Instructions (Signed)
 [employer] transportation system.  [phone] please call and dial ex [phone] ( in county ) [phone]  Agency Name: [employer] Address: [address] [place], [place] [address] Phone: [phone] Website: [email or web address] Services Offered: Transportation for a fee.  [employer] [place] [phone]

## 2024-06-24 NOTE — Progress Notes (Signed)
 Patient ID: Jason Walsh, male   DOB: [birth year], 63 y.o.   MRN: [id number]  BP (!) 159/72 (BP Location: Right Arm)   Pulse 61   Temp 97.8 F (36.6 C) (Oral)   Resp 18   Ht 5' 9 (1.753 m)   Wt 70.7 kg   SpO2 100%   BMI 23.02 kg/m    2 days postop incision drainage left long finger/middle finger for infectious tenosynovitis  Debridement index finger status post laceration  Cultures showed no growth but gram-positive cocci noted  No other orthopedic reasons for continued admission.  Discharge planning left to hospitalist  Recommend either doxycycline or Bactrim  to cover the gram-positive cocci  Follow-up Dr. Margrette 1 week to check the wound and get the sutures removed

## 2024-06-24 NOTE — Progress Notes (Signed)
 Patient is being discharged. All discharge instructions reviewed with patient including new medications and follow up appointments. Patient verbalized a full understanding. Pt sister is his ride home.

## 2024-06-25 ENCOUNTER — Telehealth: Payer: Self-pay

## 2024-06-25 NOTE — Transitions of Care (Post Inpatient/ED Visit) (Signed)
   06/25/2024  Name: Jason Walsh MRN: [id number] DOB: July 26, 1961  Today's TOC FU Call Status: Today's TOC FU Call Status:: Unsuccessful Call (1st Attempt) Unsuccessful Call (1st Attempt) Date: 06/25/24  Attempted to reach the patient regarding the most recent Inpatient/ED visit.  Follow Up Plan: Additional outreach attempts will be made to reach the patient to complete the Transitions of Care (Post Inpatient/ED visit) call.   Signature Julian Lemmings, LPN [facility] Nurse Health Advisor Direct Dial [phone]

## 2024-06-26 LAB — CULTURE, BLOOD (ROUTINE X 2): Culture: NO GROWTH

## 2024-06-27 LAB — AEROBIC/ANAEROBIC CULTURE W GRAM STAIN (SURGICAL/DEEP WOUND)

## 2024-06-28 ENCOUNTER — Telehealth: Payer: Self-pay | Admitting: Orthopedic Surgery

## 2024-06-28 LAB — CULTURE, BLOOD (ROUTINE X 2)
Culture: NO GROWTH
Culture: NO GROWTH
Special Requests: ADEQUATE
Special Requests: ADEQUATE

## 2024-06-28 NOTE — Telephone Encounter (Signed)
 This pt had surgery 7/15, he has to use [facility] to get here and arrange it 3 days in advance.  Scheduled him for Friday, is that good as far as when he needed to return?  [phone]

## 2024-06-28 NOTE — Transitions of Care (Post Inpatient/ED Visit) (Unsigned)
   06/28/2024  Name: Jason Walsh MRN: [id number] DOB: 24-Oct-1961  Today's TOC FU Call Status: Today's TOC FU Call Status:: Unsuccessful Call (2nd Attempt) Unsuccessful Call (1st Attempt) Date: 06/25/24 Unsuccessful Call (2nd Attempt) Date: 06/28/24  Attempted to reach the patient regarding the most recent Inpatient/ED visit.  Follow Up Plan: Additional outreach attempts will be made to reach the patient to complete the Transitions of Care (Post Inpatient/ED visit) call.   Signature Julian Lemmings, LPN [facility] Nurse Health Advisor Direct Dial [phone]

## 2024-06-29 ENCOUNTER — Other Ambulatory Visit: Payer: Self-pay

## 2024-06-29 ENCOUNTER — Ambulatory Visit (HOSPITAL_COMMUNITY): Attending: Family Medicine | Admitting: Physical Therapy

## 2024-06-29 DIAGNOSIS — M25642 Stiffness of left hand, not elsewhere classified: Secondary | ICD-10-CM | POA: Diagnosis present

## 2024-06-29 DIAGNOSIS — L03012 Cellulitis of left finger: Secondary | ICD-10-CM | POA: Insufficient documentation

## 2024-06-29 DIAGNOSIS — M79642 Pain in left hand: Secondary | ICD-10-CM | POA: Insufficient documentation

## 2024-06-29 DIAGNOSIS — S61321A Laceration with foreign body of left index finger with damage to nail, initial encounter: Secondary | ICD-10-CM | POA: Insufficient documentation

## 2024-06-29 DIAGNOSIS — S61201A Unspecified open wound of left index finger without damage to nail, initial encounter: Secondary | ICD-10-CM | POA: Insufficient documentation

## 2024-06-29 NOTE — Transitions of Care (Post Inpatient/ED Visit) (Signed)
   06/29/2024  Name: Jason Walsh MRN: [id number] DOB: Jan 01, 1961  Today's TOC FU Call Status: Today's TOC FU Call Status:: Successful TOC FU Call Completed Unsuccessful Call (1st Attempt) Date: 06/25/24 Unsuccessful Call (2nd Attempt) Date: 06/28/24 [facility] FU Call Complete Date: 06/29/24 Patient's Name and Date of Birth confirmed.  Transition Care Management Follow-up Telephone Call Date of Discharge: 06/24/24 [facility] Type of Discharge: Inpatient Admission Primary Inpatient Discharge Diagnosis:: cellulitis How have you been since you were released from the hospital?: Better Any questions or concerns?: No  Items Reviewed: Did you receive and understand the discharge instructions provided?: Yes Medications obtained,verified, and reconciled?: Yes (Medications Reviewed) Any new allergies since your discharge?: No Dietary orders reviewed?: Yes Do you have support at home?: Yes People in Home [RPT]: child(ren), adult  Medications Reviewed Today: Medications Reviewed Today   Medications were not reviewed in this encounter     Home Care and Equipment/Supplies: Were Home Health Services Ordered?: NA Any new equipment or medical supplies ordered?: NA  Functional Questionnaire: Do you need assistance with bathing/showering or dressing?: Yes Do you need assistance with meal preparation?: No Do you need assistance with eating?: No Do you have difficulty maintaining continence: No Do you need assistance with getting out of bed/getting out of a chair/moving?: No Do you have difficulty managing or taking your medications?: No  Follow up appointments reviewed: PCP Follow-up appointment confirmed?: No (declined, will call back to schedule) MD Provider Line Number:[phone] Given: No Specialist Hospital Follow-up appointment confirmed?: NA Do you need transportation to your follow-up appointment?: No Do you understand care options if your condition(s)  worsen?: Yes-patient verbalized understanding    SIGNATURE Julian Lemmings, LPN [facility] Nurse Health Advisor Direct Dial [phone]

## 2024-06-29 NOTE — Therapy (Signed)
 OUTPATIENT PHYSICAL THERAPY NEURO EVALUATION   Patient Name: Jason Walsh MRN: [id number] DOB:[birth year], 63 y.o., male Today's Date: 06/29/2024   PCP: Maxwell Ahle REFERRING PROVIDER: Willette Adriana LABOR, MD  END OF SESSION:  PT End of Session - 06/29/24 1018     Visit Number 1    Number of Visits 12    Date for PT Re-Evaluation 08/10/24    Authorization Type [employer] medicaid auth put in    Progress Note Due on Visit 10    PT Start Time 0910    PT Stop Time 1000    PT Time Calculation (min) 50 min    Activity Tolerance Patient tolerated treatment well    Behavior During Therapy [facility] for tasks assessed/performed          Past Medical History:  Diagnosis Date   Cancer (HCC) 2010   skin   Lumbar disc disorder    Stroke (HCC) 2016   Past Surgical History:  Procedure Laterality Date   FINGER SURGERY     L index and pinky   IRRIGATION AND DEBRIDEMENT KNEE Right 07/10/2017   Procedure: RIGHT KNEE SCOPE IRRIGATION AND DEBRIDEMENT;  Surgeon: Gerome Charleston, MD;  Location: [facility];  Service: Orthopedics;  Laterality: Right;   MENISCUS REPAIR  09/09/2017   NOSE SURGERY     skin cancer   SKIN CANCER EXCISION Bilateral    basal cell   TENOLYSIS Left 06/22/2024   Procedure: INCISION, TENDON SHEATH;  Surgeon: Margrette Taft BRAVO, MD;  Location: [facility];  Service: Orthopedics;  Laterality: Left;  incision and drainage left middle/long finger and debridement left index finger   Patient Active Problem List   Diagnosis Date Noted   Cellulitis of finger of left hand 06/22/2024   Laceration of left index finger with foreign body and damage to nail 06/22/2024   Cellulitis 06/21/2024   Hypokalemia 06/21/2024   AKI (acute kidney injury) (HCC) 06/21/2024   History of stroke 09/17/2022   History of substance use 09/17/2022   Cancer of skin of face 09/17/2022   Chronic right-sided low back pain with right-sided sciatica 09/17/2022   Essential hypertension 09/17/2022   Hyperlipidemia  01/04/2015   Tobacco abuse 01/03/2015    ONSET DATE: 06/12/24  REFERRING DIAG:  Diagnosis  L03.012 (ICD-10-CM) - Cellulitis of finger of left hand  S61.321A (ICD-10-CM) - Laceration of left index finger with foreign body and damage to nail, initial encounter    THERAPY DIAG:  Diagnosis  L03.012 (ICD-10-CM) - Cellulitis of finger of left hand  S61.321A (ICD-10-CM) - Laceration of left index finger with foreign body and damage to nail, initial encounter   Lt hand pain Decreased ROM of left hand   Rationale for Evaluation and Treatment: Rehabilitation 06/12/24:  per ER:  Jason Walsh is a 63 y.o. male with PMH as listed below who presents with laceration to L index finger d/t table saw.  PT wounds sutured Pt returned to ER on 7/14:  Per ER  Jason Walsh is a 52 with no significant past medical history with exception of skin cancer chronic on left ear presented to the ED with a chief complaint of erythema edema to his left index finger. About 9 days ago on July 5 he had a accidental laceration with tablesaw to the dorsal aspect of the finger. At the time laceration was sutured dressed.. Now is progressively worsening with edema erythema was extending to his 2nd and 3rd digit onto his palm and hand area. Third  index pending there is edematous unable to flex or extend due to severe pain. Second digit the wound is opened up and draining.    Wound Therapy - 06/29/24 0001     Subjective Pt states that he injured his hand using a table saw on 06/12/24    Patient and Family Stated Goals full use of hand no pain    Date of Onset 06/12/24    Prior Treatments ER visits, IP, I&D    Pain Scale 0-10    Pain Score 4     Pain Type Acute pain    Pain Location Finger (Comment which one)    Pain Descriptors / Indicators Jabbing    Pain Onset On-going    Patients Stated Pain Goal 0    Evaluation and Treatment Procedures Explained to Patient/Family Yes    Evaluation and Treatment Procedures agreed to     Wound Properties Date First Assessed: 06/22/24 Time First Assessed: 1533 Primary Wound Type: Surgical Secondary Wound Type - Surgical: Closed Surgical Incision Location: Hand Location Orientation: Left Wound Description (Comments): Left index/middle finger as well as palm.  Seen in PT for the first time on 7/22   Wound Image Images linked: 2    Wound Length (cm) 4 cm   width is 2 cm;  stitches on middle finger and palm   Closure None    Drainage Description Serosanguineous    Drainage Amount Small    Dressing Type Impregnated gauze (bismuth);Gauze (Comment)    Dressing Changed Changed    Dressing Status Old drainage    Margins Attached edges (approximated)    Treatment Cleansed;Other (Comment)   debridement and exercise   Wound Therapy - Clinical Statement see below    Wound Therapy - Functional Problem List see below    Factors Delaying/Impairing Wound Healing Infection - systemic/local    Hydrotherapy Plan Debridement;Dressing change;Patient/family education;Other (comment)   exercise.   Wound Therapy - Frequency 2X / week   foir 6 weeks   Wound Therapy - Current Recommendations PT    Wound Therapy - Follow Up Recommendations Other (comment)    Wound Plan exercise, manual and debridement with dressing change to regain functional use    Dressing  xeroform, 4x4, finger bandages for middle and fourth finger, 2 kling for hand followed by netting         ROM of DIP and PIP is decreased 90%; MCP decreased 60% Pt admitted to hospital had an I&D on 7/15 and was discharged on 06/24/24.     PATIENT EDUCATION: Education details: HEP for ROM Person educated: Patient Education method: Explanation, Verbal cues, and Handouts Education comprehension: returned demonstration   HOME EXERCISE PROGRAM: Access Code: [email or web address] URL: [email or web address] Date: 06/29/2024 Prepared by: Montie Metro  Exercises - Seated Full Fist AROM  - 3 x daily - 7 x weekly - 1 sets - 10 reps - 3  hold - Finger MP Flexion AROM  - 3 x daily - 7 x weekly - 1 sets - 10 reps - 3 hold - Finger Spreading  - 3 x daily - 7 x weekly - 1 sets - 10 reps - 3 hold - Thumb Opposition  - 3 x daily - 7 x weekly - 1 sets - 10 reps - 3 hold - Wrist AROM Flexion Extension  - 3 x daily - 7 x weekly - 1 sets - 10 reps - 3 hold - Seated Wrist Flexor Hook Fist Tendon Gliding  - 3 x daily - 7  x weekly - 1 sets - 10 reps - 3 hold   GOALS: Goals reviewed with patient? No  SHORT TERM GOALS: Target date: 07/20/24  Pt to be completing HEP to have regained 70% of full ROM Baseline: Goal status: INITIAL  2.  Pt wounds to be 100% granulated Baseline:  Goal status: INITIAL  3.  Pt pain to be decreased to no greater than a 2/10 Baseline:  Goal status: INITIAL   LONG TERM GOALS: Target date: 01/11/24  PT wounds on Lt hand to be healed  Baseline:  Goal status: INITIAL  2.  Pt to have no pain in his Lt hand Baseline:  Goal status: INITIAL  3.  Pt to state that his Lt hand is fully functioning.  Baseline:  Goal status: INITIAL  ASSESSMENT:  CLINICAL IMPRESSION: Patient is a 63 y.o. male  who was seen today for physical therapy evaluation and treatment for Lt hand/finger laceration with cellulitis. PT has an open wound on the dorsal aspect of his left fourth finger.  His middle finger and palm have multiple stiches.  There is significant edema in his third and fourth finger with significant decreased ROM and strength.  Mr. Yeatts will benefit from skilled PT to progress ROM and functional use of his Lt hand as well as wound care to create a healing environment for his wound.    OBJECTIVE IMPAIRMENTS: decreased activity tolerance, decreased ROM, decreased strength, increased edema, increased fascial restrictions, impaired flexibility, pain, and decreased skin integrity.   ACTIVITY LIMITATIONS: carrying, lifting, bathing, dressing, and hygiene/grooming  PARTICIPATION LIMITATIONS: meal prep, cleaning,  laundry, driving, shopping, community activity, and yard work   [facility] POTENTIAL: Good  CLINICAL DECISION MAKING: Stable/uncomplicated  EVALUATION COMPLEXITY: Moderate  PLAN: PT FREQUENCY: 2x/week  PT DURATION: 6 weeks  PLANNED INTERVENTIONS: 97110-Therapeutic exercises, 97535- Self Care, 02859- Manual therapy, and 97597- Wound care (first 20 sq cm)  PLAN FOR NEXT SESSION: continue with irrigation, debridement, exercise including gentle PROM and dressing change.   Montie Metro, PT CLT [phone]  07-12-24, 10:19 AM    Managed Medicaid Authorization Request Treatment Start Date: 07/12/24  Visit Dx Codes:  Diagnosis  L03.012 (ICD-10-CM) - Cellulitis of finger of left hand  S61.321A (ICD-10-CM) - Laceration of left index finger with foreign body and damage to nail, initial encounter  M79.642 M25.642  Functional Tool Score: PT unable to use his LT hand at all at this point due to decreased ROM and pain.   For all possible CPT codes, reference the Planned Interventions line above.     Check all conditions that are expected to impact treatment: {Conditions expected to impact treatment:None of these apply   If treatment provided at initial evaluation, no treatment charged due to lack of authorization.

## 2024-07-01 ENCOUNTER — Ambulatory Visit (HOSPITAL_COMMUNITY): Admitting: Physical Therapy

## 2024-07-01 DIAGNOSIS — M79642 Pain in left hand: Secondary | ICD-10-CM

## 2024-07-01 DIAGNOSIS — M25642 Stiffness of left hand, not elsewhere classified: Secondary | ICD-10-CM

## 2024-07-01 DIAGNOSIS — S61201A Unspecified open wound of left index finger without damage to nail, initial encounter: Secondary | ICD-10-CM | POA: Diagnosis not present

## 2024-07-01 NOTE — Therapy (Signed)
 OUTPATIENT PHYSICAL THERAPY wound Treatment    Patient Name: Jason Walsh MRN: [id number] DOB:[birth year], 63 y.o., male Today's Date: 07/01/2024   PCP: Maxwell Ahle REFERRING PROVIDER: Willette Adriana LABOR, MD  END OF SESSION:  PT End of Session - 07/01/24 0946     Visit Number 2    Number of Visits 12    Date for PT Re-Evaluation 08/10/24    Authorization Type NO auth required.    Progress Note Due on Visit 10    PT Start Time 0915    PT Stop Time 0944    PT Time Calculation (min) 29 min    Activity Tolerance Patient tolerated treatment well    Behavior During Therapy [facility] for tasks assessed/performed          Past Medical History:  Diagnosis Date   Cancer (HCC) 2010   skin   Lumbar disc disorder    Stroke (HCC) 2016   Past Surgical History:  Procedure Laterality Date   FINGER SURGERY     L index and pinky   IRRIGATION AND DEBRIDEMENT KNEE Right 07/10/2017   Procedure: RIGHT KNEE SCOPE IRRIGATION AND DEBRIDEMENT;  Surgeon: Gerome Charleston, MD;  Location: [facility];  Service: Orthopedics;  Laterality: Right;   MENISCUS REPAIR  09/09/2017   NOSE SURGERY     skin cancer   SKIN CANCER EXCISION Bilateral    basal cell   TENOLYSIS Left 06/22/2024   Procedure: INCISION, TENDON SHEATH;  Surgeon: Margrette Taft BRAVO, MD;  Location: [facility];  Service: Orthopedics;  Laterality: Left;  incision and drainage left middle/long finger and debridement left index finger   Patient Active Problem List   Diagnosis Date Noted   Cellulitis of finger of left hand 06/22/2024   Laceration of left index finger with foreign body and damage to nail 06/22/2024   Cellulitis 06/21/2024   Hypokalemia 06/21/2024   AKI (acute kidney injury) (HCC) 06/21/2024   History of stroke 09/17/2022   History of substance use 09/17/2022   Cancer of skin of face 09/17/2022   Chronic right-sided low back pain with right-sided sciatica 09/17/2022   Essential hypertension 09/17/2022   Hyperlipidemia 01/04/2015    Tobacco abuse 01/03/2015    ONSET DATE: 06/12/24  REFERRING DIAG:  Diagnosis  L03.012 (ICD-10-CM) - Cellulitis of finger of left hand  S61.321A (ICD-10-CM) - Laceration of left index finger with foreign body and damage to nail, initial encounter    THERAPY DIAG:  Diagnosis  L03.012 (ICD-10-CM) - Cellulitis of finger of left hand  S61.321A (ICD-10-CM) - Laceration of left index finger with foreign body and damage to nail, initial encounter   Lt hand pain Decreased ROM of left hand   Rationale for Evaluation and Treatment: Rehabilitation 06/12/24:  per ER:  Jason Walsh is a 63 y.o. male with PMH as listed below who presents with laceration to L index finger d/t table saw.  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 About 9 days ago on July 5 he had a accidental laceration with tablesaw to the dorsal aspect of the finger. At the time laceration was sutured dressed.. Now is progressively worsening with edema erythema was extending to his 2nd and 3rd digit onto his palm and hand area. Third index  pending there is edematous unable to flex or extend due to severe pain. Second digit the wound is opened up and draining.    Wound Therapy - 07/01/24 0947     Subjective Pt states that he has been doing his exercises.  Feels better about his hand on the whole but is having difficulty bending his fingers still    Patient and Family Stated Goals full use of hand no pain    Date of Onset 06/12/24    Prior Treatments ER visits, IP, I&D    Pain Scale 0-10    Pain Score 3     Pain Type Acute pain    Evaluation and Treatment Procedures Explained to Patient/Family Yes    Evaluation and Treatment Procedures agreed to    Wound Properties Date First Assessed: 06/22/24 Time First Assessed: 1533  Primary Wound Type: Surgical Secondary Wound Type - Surgical: Closed Surgical Incision Location: Hand Location Orientation: Left Wound Description (Comments): Left index/middle finger as well as palm.  Seen in PT for the first time on 7/22   Closure None    Drainage Description Serosanguineous    Drainage Amount Small    Dressing Type Impregnated gauze (bismuth);Gauze (Comment)    Dressing Changed Changed    Dressing Status Old drainage    Margins Attached edges (approximated)    Treatment Cleansed    Wound Therapy - Clinical Statement see below    Wound Therapy - Functional Problem List see below    Factors Delaying/Impairing Wound Healing Infection - systemic/local    Hydrotherapy Plan Debridement;Dressing change;Patient/family education;Other (comment)   exercise.   Wound Therapy - Frequency 2X / week   foir 6 weeks   Wound Therapy - Current Recommendations PT    Wound Therapy - Follow Up Recommendations Other (comment)    Wound Plan exercise, manual and debridement with dressing change to regain functional use    Dressing  vaseline around wounds 2x2 to stitch area on palm as well as on middle finger,silver hydrofiber to index finger followed by, 4x4, finger bandages for middle and fourth finger, 2 kling for hand followed by netting        07/01/24:  Gentle PROM to DIP, PIP and MCP ; therapist instructed pt to begin gentle PROM to MCP.  Will begin self PROM to PIP and DIP following removal of stitches.   ROM of DIP and PIP is decreased 90%; MCP decreased 60% Pt admitted to hospital had an I&D on 7/15 and was discharged on 06/24/24.     PATIENT EDUCATION: Education details: HEP for ROM Person educated: Patient Education method: Explanation, Verbal cues, and Handouts Education comprehension: returned demonstration   HOME EXERCISE PROGRAM: Access Code: [email or web address] URL: [email or web address] Date: 06/29/2024 Prepared by: Montie Metro  Exercises - Seated Full Fist  AROM  - 3 x daily - 7 x weekly - 1 sets - 10 reps - 3 hold - Finger MP Flexion AROM  - 3 x daily - 7 x weekly - 1 sets - 10 reps - 3 hold - Finger Spreading  - 3 x daily - 7 x weekly - 1 sets - 10 reps - 3 hold - Thumb Opposition  - 3 x daily - 7 x weekly - 1 sets - 10 reps - 3 hold - Wrist AROM Flexion Extension  - 3 x daily - 7 x weekly - 1 sets - 10 reps - 3 hold - Seated Wrist Flexor Hook Fist Tendon Gliding  - 3 x daily -  7 x weekly - 1 sets - 10 reps - 3 hold   GOALS: Goals reviewed with patient? Yes  SHORT TERM GOALS: Target date: 07/20/24  Pt to be completing HEP to have regained 70% of full ROM Baseline: Goal status: IN PROGRESS  2.  Pt wounds to be 100% granulated Baseline:  Goal status: IN PROGRESS  3.  Pt pain to be decreased to no greater than a 2/10 Baseline:  Goal status: IN PROGRESS   LONG TERM GOALS: Target date: 01/11/24  PT wounds on Lt hand to be healed  Baseline:  Goal status: IN PROGRESS  2.  Pt to have no pain in his Lt hand Baseline:  Goal status: IN PROGRESS  3.  Pt to state that his Lt hand is fully functioning.  Baseline:  Goal status: IN PROGRESS  ASSESSMENT:  CLINICAL IMPRESSION: Pt with noted decreased edema in his third and fourth finger with finger compression bandages.  PT has improved granulation in wound bed with slightly improved ROM.   Mr. Chittum will continue to  benefit from skilled PT to progress ROM and functional use of his Lt hand as well as wound care to create a healing environment for his wound.    OBJECTIVE IMPAIRMENTS: decreased activity tolerance, decreased ROM, decreased strength, increased edema, increased fascial restrictions, impaired flexibility, pain, and decreased skin integrity.   ACTIVITY LIMITATIONS: carrying, lifting, bathing, dressing, and hygiene/grooming  PARTICIPATION LIMITATIONS: meal prep, cleaning, laundry, driving, shopping, community activity, and yard work   [facility] POTENTIAL: Good  CLINICAL  DECISION MAKING: Stable/uncomplicated  EVALUATION COMPLEXITY: Moderate  PLAN: PT FREQUENCY: 2x/week  PT DURATION: 6 weeks  PLANNED INTERVENTIONS: 97110-Therapeutic exercises, 97535- Self Care, 02859- Manual therapy, and 97597- Wound care (first 20 sq cm)  PLAN FOR NEXT SESSION: continue with irrigation, debridement, exercise including gentle PROM and dressing change.   Montie Metro, PT CLT [phone]  07/01/2024, 9:52 AM

## 2024-07-02 ENCOUNTER — Ambulatory Visit: Admitting: Orthopedic Surgery

## 2024-07-02 DIAGNOSIS — S61321D Laceration with foreign body of left index finger with damage to nail, subsequent encounter: Secondary | ICD-10-CM

## 2024-07-02 DIAGNOSIS — M6510 Other infective (teno)synovitis, unspecified site: Secondary | ICD-10-CM

## 2024-07-02 NOTE — Progress Notes (Signed)
 Encounter Diagnoses  Name Primary?   Laceration of left index finger with foreign body and damage to nail, subsequent encounter Yes   Infectious tenosynovitis LEFT LONG FINGER     POST OP [facility]   DOS 06/22/24 PRE-OPERATIVE DIAGNOSIS:  tenosynovitis left long finger, necrosis left index finger   POST-OPERATIVE DIAGNOSIS:  tenosynovitis left long finger, necrosis left index finger   PROCEDURE:  Procedure(s) with comments: INCISION, TENDON SHEATH (Left) - incision and drainage left middle/long finger and debridement left index finger   #1  Left long finger flexor tenosynovitis infectious pus under the sheath   #2 necrotic edges dorsal aspect left index finger.  Skin debridement was performed 2 sutures were removed.   The scabbed portion was left in place to protect the integrity of the tendon   Postop plan Continue culture specific antibiotics when available Broad-spectrum antibiotics until cultures definitive Start active range of motion of the fingers and digits Post discharge we will seek hand surgery and/or plastic surgery consult for coverage of the index finger  First postop visit patient is doing well the index finger scab is coming off it actually looks pretty good both fingers are very stiff the long finger shows no signs of infection all sutures were removed he is just got a lot of stiffness and cannot really bend his finger.  Hopefully with continued wound care the dorsal skin on the index finger will continue to heal and granulate in  He can start reactive range of motion exercises now  Follow-up in 2 weeks

## 2024-07-06 ENCOUNTER — Ambulatory Visit (HOSPITAL_COMMUNITY): Admitting: Physical Therapy

## 2024-07-06 DIAGNOSIS — M25642 Stiffness of left hand, not elsewhere classified: Secondary | ICD-10-CM

## 2024-07-06 DIAGNOSIS — S61201A Unspecified open wound of left index finger without damage to nail, initial encounter: Secondary | ICD-10-CM | POA: Diagnosis not present

## 2024-07-06 NOTE — Therapy (Signed)
 OUTPATIENT PHYSICAL THERAPY wound Treatment    Patient Name: Jason Walsh MRN: [id number] DOB:05/13/1961, 63 y.o., male Today's Date: 07/06/2024   PCP: Maxwell Ahle REFERRING PROVIDER: Willette Adriana LABOR, MD  END OF SESSION:  PT End of Session - 07/06/24 0958     Visit Number 3    Number of Visits 12    Date for PT Re-Evaluation 08/10/24    Authorization Type NO auth required.    Progress Note Due on Visit 10    PT Start Time 0908    PT Stop Time [phone]    PT Time Calculation (min) 35 min    Activity Tolerance Patient tolerated treatment well    Behavior During Therapy [facility] for tasks assessed/performed          Past Medical History:  Diagnosis Date   Cancer (HCC) 2010   skin   Lumbar disc disorder    Stroke (HCC) 2016   Past Surgical History:  Procedure Laterality Date   FINGER SURGERY     L index and pinky   IRRIGATION AND DEBRIDEMENT KNEE Right 07/10/2017   Procedure: RIGHT KNEE SCOPE IRRIGATION AND DEBRIDEMENT;  Surgeon: Gerome Charleston, MD;  Location: [facility];  Service: Orthopedics;  Laterality: Right;   MENISCUS REPAIR  09/09/2017   NOSE SURGERY     skin cancer   SKIN CANCER EXCISION Bilateral    basal cell   TENOLYSIS Left 06/22/2024   Procedure: INCISION, TENDON SHEATH;  Surgeon: Margrette Taft BRAVO, MD;  Location: [facility];  Service: Orthopedics;  Laterality: Left;  incision and drainage left middle/long finger and debridement left index finger   Patient Active Problem List   Diagnosis Date Noted   Cellulitis of finger of left hand 06/22/2024   Laceration of left index finger with foreign body and damage to nail 06/22/2024   Cellulitis 06/21/2024   Hypokalemia 06/21/2024   AKI (acute kidney injury) (HCC) 06/21/2024   History of stroke 09/17/2022   History of substance use 09/17/2022   Cancer of skin of face 09/17/2022   Chronic right-sided low back pain with right-sided sciatica 09/17/2022   Essential hypertension 09/17/2022   Hyperlipidemia 01/04/2015    Tobacco abuse 01/03/2015    ONSET DATE: 06/12/24  REFERRING DIAG:  Diagnosis  L03.012 (ICD-10-CM) - Cellulitis of finger of left hand  S61.321A (ICD-10-CM) - Laceration of left index finger with foreign body and damage to nail, initial encounter    THERAPY DIAG:  Diagnosis  L03.012 (ICD-10-CM) - Cellulitis of finger of left hand  S61.321A (ICD-10-CM) - Laceration of left index finger with foreign body and damage to nail, initial encounter   Lt hand pain Decreased ROM of left hand   Rationale for Evaluation and Treatment: Rehabilitation Pt admitted to hospital had an I&D on 7/15 and was discharged on 06/24/24.    06/12/24:  per ER:  Jason Walsh is a 63 y.o. male with PMH as listed below who presents with laceration to L index finger d/t table saw.  PT wounds sutured Pt returned to ER on 7/14:  Per ER  Jason Walsh is a 49 with no significant past medical history with exception of skin cancer chronic on left ear presented to the ED with a chief complaint of erythema edema to his left index finger. About 9 days ago on July 5 he had a accidental laceration with tablesaw to the dorsal aspect of the finger. At the time laceration was sutured dressed.. Now is progressively worsening with edema  erythema was extending to his 2nd and 3rd digit onto his palm and hand area. Third index pending there is edematous unable to flex or extend due to severe pain. Second digit the wound is opened up and draining.    Wound Therapy - 07/06/24 0001     Subjective Pt had his stitches taken out yesterday    Patient and Family Stated Goals full use of hand no pain    Date of Onset 06/12/24    Prior Treatments ER visits, IP, I&D    Pain Scale 0-10    Pain Score 6     Pain Type Acute pain    Pain Location Finger (Comment which one)   middle with Passive and AA ROM   Evaluation and Treatment Procedures Explained to Patient/Family Yes    Evaluation and Treatment Procedures agreed to    Wound Properties Date  First Assessed: 06/22/24 Time First Assessed: 1533 Primary Wound Type: Surgical Secondary Wound Type - Surgical: Closed Surgical Incision Location: Hand Location Orientation: Left Wound Description (Comments): Left index/middle finger as well as palm.  Seen in PT for the first time on 7/22   Closure None    Drainage Description Serous    Drainage Amount Small    Dressing Type Gauze (Comment);Silver dressings   therapist noted slight maceration therefore changed from xeroform to silver hydrofiber for Lt index finger.   Dressing Changed Changed    Dressing Status Old drainage    Margins Attached edges (approximated)    Treatment Cleansed    Wound Therapy - Clinical Statement see below    Wound Therapy - Functional Problem List see below    Factors Delaying/Impairing Wound Healing Infection - systemic/local    Hydrotherapy Plan Debridement;Dressing change;Patient/family education;Other (comment)   exercise.   Wound Therapy - Frequency 2X / week   foir 6 weeks   Wound Therapy - Current Recommendations PT    Wound Therapy - Follow Up Recommendations Other (comment)    Wound Plan exercise, manual and debridement with dressing change to regain functional use    Dressing  Lt index finger vaseline around wound followed by silver hydrofiber, 2x2  and finger wrap.  Finger bandage covered with netting.        07/06/24:   PROM/AA and AROM  to DIP, PIP and MCP ; therapist instructed pt to begin more robust ROM to all finger joints to regain functional use of left hand.  Therapist instructed pt on scar massage.   PATIENT EDUCATION: Education details: HEP for ROM Person educated: Patient Education method: Explanation, Verbal cues, and Handouts Education comprehension: returned demonstration   HOME EXERCISE PROGRAM: Access Code: [email or web address] URL: [email or web address] Date: 06/29/2024 Prepared by: Montie Metro  Exercises - Seated Full Fist AROM  - 3 x daily - 7 x weekly - 1 sets - 10  reps - 3 hold - Finger MP Flexion AROM  - 3 x daily - 7 x weekly - 1 sets - 10 reps - 3 hold - Finger Spreading  - 3 x daily - 7 x weekly - 1 sets - 10 reps - 3 hold - Thumb Opposition  - 3 x daily - 7 x weekly - 1 sets - 10 reps - 3 hold - Wrist AROM Flexion Extension  - 3 x daily - 7 x weekly - 1 sets - 10 reps - 3 hold - Seated Wrist Flexor Hook Fist Tendon Gliding  - 3 x daily - 7 x weekly - 1 sets - 10  reps - 3 hold   GOALS: Goals reviewed with patient? Yes  SHORT TERM GOALS: Target date: 07/20/24  Pt to be completing HEP to have regained 70% of full ROM Baseline: Goal status: IN PROGRESS  2.  Pt wounds to be 100% granulated Baseline:  Goal status: IN PROGRESS  3.  Pt pain to be decreased to no greater than a 2/10 Baseline:  Goal status: IN PROGRESS   LONG TERM GOALS: Target date: 01/11/24  PT wounds on Lt hand to be healed  Baseline:  Goal status: IN PROGRESS  2.  Pt to have no pain in his Lt hand Baseline:  Goal status: IN PROGRESS  3.  Pt to state that his Lt hand is fully functioning.  Baseline:  Goal status: IN PROGRESS  ASSESSMENT:  CLINICAL IMPRESSION: Pt stitches taken out with no open area of middle finger or palm.  Middle finger has significant scar tissue therapist completed and instructed pt in self scar massage. Pt initally states that he can not bend fingers any farther than a 90% limitation, however, with PROM, AAROM and AROM pt is able to demonstrate a 75% limitation at the end of session.  Therapist instructed pt in desensitization program and pt third finger appears to be hypersensitive.  Therapist debrided index finger using forceps and scissors.    OBJECTIVE IMPAIRMENTS: decreased activity tolerance, decreased ROM, decreased strength, increased edema, increased fascial restrictions, impaired flexibility, pain, and decreased skin integrity.   ACTIVITY LIMITATIONS: carrying, lifting, bathing, dressing, and hygiene/grooming  PARTICIPATION  LIMITATIONS: meal prep, cleaning, laundry, driving, shopping, community activity, and yard work   [facility] POTENTIAL: Good  CLINICAL DECISION MAKING: Stable/uncomplicated  EVALUATION COMPLEXITY: Moderate  PLAN: PT FREQUENCY: 2x/week  PT DURATION: 6 weeks  PLANNED INTERVENTIONS: 97110-Therapeutic exercises, 97535- Self Care, 02859- Manual therapy, and 97597- Wound care (first 20 sq cm)  PLAN FOR NEXT SESSION: continue with irrigation, debridement, exercise including gentle PROM and dressing change.   Montie Metro, PT CLT [phone]  07/06/2024, 10:03 AM

## 2024-07-07 ENCOUNTER — Other Ambulatory Visit: Payer: Self-pay

## 2024-07-07 ENCOUNTER — Encounter (HOSPITAL_COMMUNITY): Payer: Self-pay

## 2024-07-07 ENCOUNTER — Emergency Department (HOSPITAL_COMMUNITY)
Admission: EM | Admit: 2024-07-07 | Discharge: 2024-07-08 | Disposition: A | Discharge status: Home / Self Care | Attending: Emergency Medicine | Admitting: Emergency Medicine

## 2024-07-07 DIAGNOSIS — Z7982 Long term (current) use of aspirin: Secondary | ICD-10-CM | POA: Diagnosis not present

## 2024-07-07 DIAGNOSIS — L089 Local infection of the skin and subcutaneous tissue, unspecified: Secondary | ICD-10-CM

## 2024-07-07 DIAGNOSIS — Z4800 Encounter for change or removal of nonsurgical wound dressing: Secondary | ICD-10-CM | POA: Insufficient documentation

## 2024-07-07 DIAGNOSIS — I10 Essential (primary) hypertension: Secondary | ICD-10-CM | POA: Insufficient documentation

## 2024-07-07 NOTE — ED Triage Notes (Signed)
 Pov from home cc of wound to his left wrist that he noticed this evening. Says it is getting worse. Denies bug bite. Says it stems from infection in his finger.

## 2024-07-08 ENCOUNTER — Ambulatory Visit (HOSPITAL_COMMUNITY): Admitting: Physical Therapy

## 2024-07-08 DIAGNOSIS — M25642 Stiffness of left hand, not elsewhere classified: Secondary | ICD-10-CM

## 2024-07-08 DIAGNOSIS — S61201A Unspecified open wound of left index finger without damage to nail, initial encounter: Secondary | ICD-10-CM | POA: Diagnosis not present

## 2024-07-08 DIAGNOSIS — M79642 Pain in left hand: Secondary | ICD-10-CM

## 2024-07-08 MED ORDER — CLINDAMYCIN HCL 300 MG PO CAPS: 300.0000 mg | ORAL_CAPSULE | Freq: Four times a day (QID) | ORAL | 0 refills | Status: DC

## 2024-07-08 MED ORDER — CLINDAMYCIN HCL 150 MG PO CAPS
300.0000 mg | ORAL_CAPSULE | Freq: Once | ORAL | Status: AC
  Administered 2024-07-08: 300 mg via ORAL
  Filled 2024-07-08: qty 2

## 2024-07-08 NOTE — ED Provider Notes (Signed)
  [place] EMERGENCY DEPARTMENT AT [facility] Provider Note   CSN: [id number] Arrival date & time: 07/07/24  2101     Patient presents with: Wound Check   Jason Walsh is a 63 y.o. male.   Patient is a 63 year old male with history of hypertension.  He was recently hospitalized after injuring his 2nd and 3rd fingers of his left hand on a table saw.  He developed a wound infection and just completed Bactrim .  Patient presents today after noticing a small pustule to his forearm.  He is concerned about the infection spreading from his finger to his arm.  No fevers or chills.       Prior to Admission medications   Medication Sig Start Date End Date Taking? Authorizing Provider  amLODipine  (NORVASC ) 10 MG tablet Take 1 tablet (10 mg total) by mouth daily. 06/24/24 09/22/24  Willette Adriana LABOR, MD  aspirin  EC 81 MG tablet Take 1 tablet (81 mg total) by mouth daily. Swallow whole. 06/25/24   Shahmehdi, Adriana LABOR, MD  atorvastatin  (LIPITOR) 40 MG tablet Take 1 tablet (40 mg total) by mouth daily. 06/24/24 06/24/25  Willette Adriana LABOR, MD    Allergies: Gabapentin     Review of Systems  All other systems reviewed and are negative.   Updated Vital Signs BP 132/60   Pulse 67   Temp 98.2 F (36.8 C) (Oral)   Resp 18   Ht 5' 9 (1.753 m)   Wt 70.7 kg   SpO2 94%   BMI 23.02 kg/m   Physical Exam Vitals and nursing note reviewed.  Constitutional:      Appearance: Normal appearance.  Pulmonary:     Effort: Pulmonary effort is normal.  Musculoskeletal:     Comments: To the distal third of the right forearm, there is a small pustule noted.  There is minimal surrounding erythema.  Skin:    General: Skin is warm and dry.  Neurological:     Mental Status: He is alert and oriented to person, place, and time.     (all labs ordered are listed, but only abnormal results are displayed) Labs Reviewed - No data to display  EKG: None  Radiology: No results  found.   Procedures   Medications Ordered in the ED  clindamycin  (CLEOCIN ) capsule 300 mg (has no administration in time range)                                    Medical Decision Making Risk Prescription drug management.   In case this is developing infection, I will prescribe clindamycin .  I have reviewed his chart and the culture done of his wound grew MRSA that is susceptible to clindamycin .  Patient to be discharged.  He has an appointment with wound care tomorrow.     Final diagnoses:  None    ED Discharge Orders     None          Geroldine Berg, MD 07/08/24 0030

## 2024-07-08 NOTE — Therapy (Signed)
 OUTPATIENT PHYSICAL THERAPY wound Treatment    Patient Name: Jason Walsh MRN: [id number] DOB:Oct 25, 1961, 63 y.o., male Today's Date: 07/08/2024   PCP: Maxwell Ahle REFERRING PROVIDER: Willette Adriana LABOR, MD  END OF SESSION:  PT End of Session - 07/08/24 1002     Visit Number 4    Number of Visits 12    Date for PT Re-Evaluation 08/10/24    Authorization Type NO auth required.    Progress Note Due on Visit 10    PT Start Time 0915    PT Stop Time 0958    PT Time Calculation (min) 43 min    Activity Tolerance Patient tolerated treatment well    Behavior During Therapy [facility] for tasks assessed/performed          Past Medical History:  Diagnosis Date   Cancer (HCC) 2010   skin   Lumbar disc disorder    Stroke (HCC) 2016   Past Surgical History:  Procedure Laterality Date   FINGER SURGERY     L index and pinky   IRRIGATION AND DEBRIDEMENT KNEE Right 07/10/2017   Procedure: RIGHT KNEE SCOPE IRRIGATION AND DEBRIDEMENT;  Surgeon: Gerome Charleston, MD;  Location: [facility];  Service: Orthopedics;  Laterality: Right;   MENISCUS REPAIR  09/09/2017   NOSE SURGERY     skin cancer   SKIN CANCER EXCISION Bilateral    basal cell   TENOLYSIS Left 06/22/2024   Procedure: INCISION, TENDON SHEATH;  Surgeon: Margrette Taft BRAVO, MD;  Location: [facility];  Service: Orthopedics;  Laterality: Left;  incision and drainage left middle/long finger and debridement left index finger   Patient Active Problem List   Diagnosis Date Noted   Cellulitis of finger of left hand 06/22/2024   Laceration of left index finger with foreign body and damage to nail 06/22/2024   Cellulitis 06/21/2024   Hypokalemia 06/21/2024   AKI (acute kidney injury) (HCC) 06/21/2024   History of stroke 09/17/2022   History of substance use 09/17/2022   Cancer of skin of face 09/17/2022   Chronic right-sided low back pain with right-sided sciatica 09/17/2022   Essential hypertension 09/17/2022   Hyperlipidemia 01/04/2015    Tobacco abuse 01/03/2015    ONSET DATE: 06/12/24  REFERRING DIAG:  Diagnosis  L03.012 (ICD-10-CM) - Cellulitis of finger of left hand  S61.321A (ICD-10-CM) - Laceration of left index finger with foreign body and damage to nail, initial encounter    THERAPY DIAG:  Diagnosis  L03.012 (ICD-10-CM) - Cellulitis of finger of left hand  S61.321A (ICD-10-CM) - Laceration of left index finger with foreign body and damage to nail, initial encounter   Lt hand pain Decreased ROM of left hand   Rationale for Evaluation and Treatment: Rehabilitation Pt admitted to hospital had an I&D on 7/15 and was discharged on 06/24/24.    06/12/24:  per ER:  Jason Walsh is a 63 y.o. male with PMH as listed below who presents with laceration to L index finger d/t table saw.  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 About 9 days ago on July 5 he had a accidental laceration with tablesaw to the dorsal aspect of the finger. At the time laceration was sutured dressed.. Now is progressively worsening with edema  erythema was extending to his 2nd and 3rd digit onto his palm and hand area. Third index pending there is edematous unable to flex or extend due to severe pain. Second digit the wound is opened up and draining.    Wound Therapy - 07/08/24 1003     Subjective Pt states that a small pustule showed up on his forearm yesterday.  This concerned him so he went to the the ER and he was given another round of antibiotics. Patient  states he feels like there is still a stitch in his palm.  PT feels like the scar tissue on his middle finger is keeping him from moving his finger.    Patient and Family Stated Goals full use of hand no pain    Date of Onset 06/12/24    Prior Treatments ER visits, IP, I&D    Pain  Scale 0-10    Pain Score 6    with PROM   Pain Type Acute pain    Evaluation and Treatment Procedures Explained to Patient/Family Yes    Evaluation and Treatment Procedures agreed to    Wound Properties Date First Assessed: 06/22/24 Time First Assessed: 1533 Primary Wound Type: Surgical Secondary Wound Type - Surgical: Closed Surgical Incision Location: Hand Location Orientation: Left Wound Description (Comments): Left index/middle finger as well as palm.  Seen in PT for the first time on 7/22   Closure None    Drainage Description Serous    Drainage Amount Small    Dressing Type Gauze (Comment);Silver dressings    Dressing Changed Changed    Dressing Status Old drainage    Margins Attached edges (approximated)    Treatment Cleansed;Other (Comment)   debridement, exercise and dressing change.   Wound Therapy - Clinical Statement see below    Wound Therapy - Functional Problem List see below    Factors Delaying/Impairing Wound Healing Infection - systemic/local    Hydrotherapy Plan Debridement;Dressing change;Patient/family education;Other (comment)   exercise.   Wound Therapy - Frequency 2X / week   foir 6 weeks   Wound Therapy - Current Recommendations PT    Wound Therapy - Follow Up Recommendations Other (comment)    Wound Plan exercise, manual and debridement with dressing change to regain functional use    Dressing  Lt index finger vaseline around wound followed by silver hydrofiber, 2x2  and finger wrap.  Finger bandage covered with netting.    Dressing Lt middle finger bandaid and netting to assist keeping in place.        07/08/24:   PROM/AA and AROM  to DIP, PIP and MCP ; therapist continues to urge pt to complete more robust ROM to all finger joints to regain functional use of left hand.  Therapist instructed pt on scar massage.   PATIENT EDUCATION: Education details: HEP for ROM Person educated: Patient Education method: Explanation, Verbal cues, and Handouts Education  comprehension: returned demonstration   HOME EXERCISE PROGRAM: Access Code: [email or web address] URL: [email or web address] Date: 06/29/2024 Prepared by: Montie Metro  Exercises - Seated Full Fist AROM  - 3 x daily - 7 x weekly - 1 sets - 10 reps - 3 hold - Finger MP Flexion AROM  - 3 x daily - 7 x weekly - 1 sets - 10 reps - 3 hold - Finger Spreading  - 3 x daily - 7 x weekly - 1 sets - 10 reps - 3 hold - Thumb Opposition  - 3 x daily - 7 x weekly - 1 sets -  10 reps - 3 hold - Wrist AROM Flexion Extension  - 3 x daily - 7 x weekly - 1 sets - 10 reps - 3 hold - Seated Wrist Flexor Hook Fist Tendon Gliding  - 3 x daily - 7 x weekly - 1 sets - 10 reps - 3 hold   GOALS: Goals reviewed with patient? Yes  SHORT TERM GOALS: Target date: 07/20/24  Pt to be completing HEP to have regained 70% of full ROM Baseline: Goal status: IN PROGRESS  2.  Pt wounds to be 100% granulated Baseline:  Goal status: IN PROGRESS  3.  Pt pain to be decreased to no greater than a 2/10 Baseline:  Goal status: IN PROGRESS   LONG TERM GOALS: Target date: 01/11/24  PT wounds on Lt hand to be healed  Baseline:  Goal status: IN PROGRESS  2.  Pt to have no pain in his Lt hand Baseline:  Goal status: IN PROGRESS  3.  Pt to state that his Lt hand is fully functioning.  Baseline:  Goal status: IN PROGRESS  ASSESSMENT:  CLINICAL IMPRESSION: Significant debridement of scar tissue on middle finger.  PT ROM in his Lt middle and index finger remain significantly limited.  Pt instructed to work on ROM over the weekend.  We will begin putty exercises next week of wounds are almost healed.    OBJECTIVE IMPAIRMENTS: decreased activity tolerance, decreased ROM, decreased strength, increased edema, increased fascial restrictions, impaired flexibility, pain, and decreased skin integrity.   ACTIVITY LIMITATIONS: carrying, lifting, bathing, dressing, and hygiene/grooming  PARTICIPATION LIMITATIONS: meal  prep, cleaning, laundry, driving, shopping, community activity, and yard work   [facility] POTENTIAL: Good  CLINICAL DECISION MAKING: Stable/uncomplicated  EVALUATION COMPLEXITY: Moderate  PLAN: PT FREQUENCY: 2x/week  PT DURATION: 6 weeks  PLANNED INTERVENTIONS: 97110-Therapeutic exercises, 97535- Self Care, 02859- Manual therapy, and 97597- Wound care (first 20 sq cm)  PLAN FOR NEXT SESSION: continue with irrigation, debridement, exercise including gentle PROM and dressing change.   Montie Metro, PT CLT [phone]  07/08/2024, 10:07 AM

## 2024-07-08 NOTE — Discharge Instructions (Addendum)
 Begin taking clindamycin  as prescribed.  Continue wound care as previously arranged.  Return to the ER if symptoms significantly worsen or change.

## 2024-07-10 ENCOUNTER — Telehealth (HOSPITAL_COMMUNITY): Payer: Self-pay

## 2024-07-10 MED ORDER — ASPIRIN 81 MG PO TBEC: 81.0000 mg | DELAYED_RELEASE_TABLET | Freq: Every day | ORAL | 12 refills | Status: DC

## 2024-07-10 MED ORDER — AMLODIPINE BESYLATE 10 MG PO TABS: 10.0000 mg | ORAL_TABLET | Freq: Every day | ORAL | 3 refills | Status: DC

## 2024-07-10 MED ORDER — ATORVASTATIN CALCIUM 40 MG PO TABS: 40.0000 mg | ORAL_TABLET | Freq: Every day | ORAL | 11 refills | Status: DC

## 2024-07-10 MED ORDER — CLINDAMYCIN HCL 300 MG PO CAPS: 300.0000 mg | ORAL_CAPSULE | Freq: Four times a day (QID) | ORAL | 0 refills | Status: AC
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
## 2024-07-12 ENCOUNTER — Ambulatory Visit: Admitting: Family Medicine

## 2024-07-12 ENCOUNTER — Encounter: Payer: Self-pay | Admitting: Family Medicine

## 2024-07-12 VITALS — BP 144/82 | HR 70 | Temp 98.1°F | Ht 69.0 in | Wt 168.0 lb

## 2024-07-12 DIAGNOSIS — L03012 Cellulitis of left finger: Secondary | ICD-10-CM | POA: Diagnosis not present

## 2024-07-12 DIAGNOSIS — G608 Other hereditary and idiopathic neuropathies: Secondary | ICD-10-CM | POA: Insufficient documentation

## 2024-07-12 NOTE — Patient Instructions (Signed)
 Keep the area clean.  Finish antibiotics.  Take care  Dr. Bluford

## 2024-07-12 NOTE — Progress Notes (Signed)
 Subjective:  Patient ID: Jason Walsh, male    DOB: 1961/01/09  Age: 63 y.o. MRN: [id number]  CC:   Chief Complaint  Patient presents with   Follow-up    HPI:  63 year old male presents for follow-up.  Recently hospitalized from 7/14 to 7/17 for cellulitis.  Had I&D and debridement of the 2nd and 3rd phalanx of the left hand.  Cultures grew MRSA.  Patient was initially doing fairly well and then developed a pustule on his left forearm which was concerning.  Went to the ED on 7/30 and was placed on antibiotic therapy with clindamycin .  Patient states that he is doing well at this time.  No fevers.  Has follow-up with orthopedics on 8/7.  Patient Active Problem List   Diagnosis Date Noted   Cellulitis of finger of left hand 06/22/2024   Laceration of left index finger with foreign body and damage to nail 06/22/2024   History of stroke 09/17/2022   History of substance use 09/17/2022   Cancer of skin of face 09/17/2022   Chronic right-sided low back pain with right-sided sciatica 09/17/2022   Essential hypertension 09/17/2022   Hyperlipidemia 01/04/2015   Tobacco abuse 01/03/2015    Social Hx   Social History   Socioeconomic History   Marital status: Legally Separated    Spouse name: Not on file   Number of children: Not on file   Years of education: Not on file   Highest education level: Not on file  Occupational History   Not on file  Tobacco Use   Smoking status: Every Day    Current packs/day: 1.00    Average packs/day: 1 pack/day for 55.4 years (55.4 ttl pk-yrs)    Types: Cigarettes    Start date: 02/02/1969   Smokeless tobacco: Never  Vaping Use   Vaping status: Never Used  Substance and Sexual Activity   Alcohol use: Not Currently    Comment: occasional   Drug use: Yes    Types: Marijuana, Cocaine    Comment: occasional .  no cocaine sine 2016   Sexual activity: Yes    Partners: Female  Other Topics Concern   Not on file  Social History Narrative    Right Handed    Lives in one story home. Lives alone   Social Drivers of Health   Financial Resource Strain: Low Risk  (01/15/2019)   Overall Financial Resource Strain (CARDIA)    Difficulty of Paying Living Expenses: Not very hard  Food Insecurity: No Food Insecurity (06/21/2024)   Hunger Vital Sign    Worried About Running Out of Food in the Last Year: Never true    Ran Out of Food in the Last Year: Never true  Transportation Needs: No Transportation Needs (06/21/2024)   PRAPARE - Administrator, Civil Service (Medical): No    Lack of Transportation (Non-Medical): No  Physical Activity: Not on file  Stress: No Stress Concern Present (01/15/2019)   [employer] of Occupational Health - Occupational Stress Questionnaire    Feeling of Stress : Only a little  Social Connections: Not on file    Review of Systems Per HPI  Objective:  BP (!) 144/82   Pulse 70   Temp 98.1 F (36.7 C)   Ht 5' 9 (1.753 m)   Wt 168 lb (76.2 kg)   SpO2 98%   BMI 24.81 kg/m      07/12/2024    9:45 AM 07/07/2024   11:30 PM 07/07/2024  11:15 PM  BP/Weight  Systolic BP 144 132 125  Diastolic BP 82 60 55  Wt. (Lbs) 168    BMI 24.81 kg/m2      Physical Exam Constitutional:      General: He is not in acute distress.    Appearance: Normal appearance.  HENT:     Head: Normocephalic and atraumatic.  Pulmonary:     Effort: Pulmonary effort is normal. No respiratory distress.  Skin:    Comments: Left second digit with wound which appears to be healing fairly well.  No signs of infection at this time.  Pustule to the left forearm is resolving.  Neurological:     Mental Status: He is alert.     Lab Results  Component Value Date   WBC 7.8 06/24/2024   HGB 13.2 06/24/2024   HCT 39.5 06/24/2024   PLT 283 06/24/2024   GLUCOSE 102 (H) 06/24/2024   CHOL 133 06/22/2024   TRIG 93 06/22/2024   HDL 33 (L) 06/22/2024   LDLCALC 81 06/22/2024   ALT 13 04/22/2024   AST 17 04/22/2024    NA 139 06/24/2024   K 3.6 06/24/2024   CL 104 06/24/2024   CREATININE 1.19 06/24/2024   BUN 20 06/24/2024   CO2 27 06/24/2024   INR 0.92 01/14/2019   HGBA1C 5.5 09/17/2022     Assessment & Plan:  Cellulitis of finger of left hand Assessment & Plan: Finish clindamycin .  Has follow-up with orthopedics.     Follow-up:  Return if symptoms worsen or fail to improve.  Jacqulyn Ahle DO [facility]

## 2024-07-12 NOTE — Assessment & Plan Note (Signed)
 Finish clindamycin .  Has follow-up with orthopedics.

## 2024-07-13 ENCOUNTER — Ambulatory Visit (HOSPITAL_COMMUNITY): Attending: Family Medicine | Admitting: Physical Therapy

## 2024-07-13 DIAGNOSIS — S61201A Unspecified open wound of left index finger without damage to nail, initial encounter: Secondary | ICD-10-CM | POA: Insufficient documentation

## 2024-07-13 DIAGNOSIS — M79642 Pain in left hand: Secondary | ICD-10-CM | POA: Diagnosis present

## 2024-07-13 DIAGNOSIS — M25642 Stiffness of left hand, not elsewhere classified: Secondary | ICD-10-CM | POA: Diagnosis present

## 2024-07-13 DIAGNOSIS — S61203A Unspecified open wound of left middle finger without damage to nail, initial encounter: Secondary | ICD-10-CM | POA: Diagnosis present

## 2024-07-13 NOTE — Therapy (Signed)
 OUTPATIENT PHYSICAL THERAPY wound Treatment    Patient Name: Jason Walsh MRN: [id number] DOB:July 14, 1961, 63 y.o., male Today's Date: 07/13/2024   PCP: Maxwell Ahle REFERRING PROVIDER: Willette Adriana LABOR, MD  END OF SESSION:  PT End of Session - 07/13/24 1001     Visit Number 5    Number of Visits 12    Date for PT Re-Evaluation 08/10/24    Authorization Type NO auth required.    Progress Note Due on Visit 10    PT Start Time 0915    PT Stop Time 0958    PT Time Calculation (min) 43 min    Activity Tolerance Patient tolerated treatment well    Behavior During Therapy [facility] for tasks assessed/performed          Past Medical History:  Diagnosis Date   Cancer (HCC) 2010   skin   Lumbar disc disorder    Stroke (HCC) 2016   Past Surgical History:  Procedure Laterality Date   FINGER SURGERY     L index and pinky   IRRIGATION AND DEBRIDEMENT KNEE Right 07/10/2017   Procedure: RIGHT KNEE SCOPE IRRIGATION AND DEBRIDEMENT;  Surgeon: Gerome Charleston, MD;  Location: [facility];  Service: Orthopedics;  Laterality: Right;   MENISCUS REPAIR  09/09/2017   NOSE SURGERY     skin cancer   SKIN CANCER EXCISION Bilateral    basal cell   TENOLYSIS Left 06/22/2024   Procedure: INCISION, TENDON SHEATH;  Surgeon: Margrette Taft BRAVO, MD;  Location: [facility];  Service: Orthopedics;  Laterality: Left;  incision and drainage left middle/long finger and debridement left index finger   Patient Active Problem List   Diagnosis Date Noted   Mixed sensory-motor polyneuropathy 07/12/2024   Cellulitis of finger of left hand 06/22/2024   Laceration of left index finger with foreign body and damage to nail 06/22/2024   History of stroke 09/17/2022   History of substance use 09/17/2022   Cancer of skin of face 09/17/2022   Chronic right-sided low back pain with right-sided sciatica 09/17/2022   Essential hypertension 09/17/2022   Hyperlipidemia 01/04/2015   Tobacco abuse 01/03/2015   Personal history of  skin cancer 03/31/2012   Basal cell carcinoma of nose 01/23/2012    ONSET DATE: 06/12/24  REFERRING DIAG:  Diagnosis  L03.012 (ICD-10-CM) - Cellulitis of finger of left hand  S61.321A (ICD-10-CM) - Laceration of left index finger with foreign body and damage to nail, initial encounter    THERAPY DIAG:  Diagnosis  L03.012 (ICD-10-CM) - Cellulitis of finger of left hand  S61.321A (ICD-10-CM) - Laceration of left index finger with foreign body and damage to nail, initial encounter   Lt hand pain Decreased ROM of left hand   Rationale for Evaluation and Treatment: Rehabilitation Pt admitted to hospital had an I&D on 7/15 and was discharged on 06/24/24.    06/12/24:  per ER:  UGO THOMA is a 63 y.o. male with PMH as listed below who presents with laceration to L index finger d/t table saw.  PT wounds sutured Pt returned to ER on 7/14:  Per ER  DAVIDE RISDON is a 73 with no significant past medical history with exception of skin cancer chronic on left ear presented to the ED with a chief complaint of erythema edema to his left index finger. About 9 days ago on July 5 he had a accidental laceration with tablesaw to the dorsal aspect of the finger. At the time laceration was sutured dressed.SABRA  Now is progressively worsening with edema erythema was extending to his 2nd and 3rd digit onto his palm and hand area. Third index pending there is edematous unable to flex or extend due to severe pain. Second digit the wound is opened up and draining.    Wound Therapy - 07/08/24 1003     Subjective Pt states that a small pustule showed up on his forearm yesterday.  This concerned him so he went to the the ER and he was given another round of antibiotics. Patient  states he feels like there is still a stitch in his palm.  PT feels like the scar tissue on his middle finger is keeping him from moving his finger.    Patient and Family Stated Goals full use of hand no pain    Date of Onset 06/12/24    Prior  Treatments ER visits, IP, I&D    Pain Scale 0-10    Pain Score 6    with PROM   Pain Type Acute pain    Evaluation and Treatment Procedures Explained to Patient/Family Yes    Evaluation and Treatment Procedures agreed to    Wound Properties Date First Assessed: 06/22/24 Time First Assessed: 1533 Primary Wound Type: Surgical Secondary Wound Type - Surgical: Closed Surgical Incision Location: Hand Location Orientation: Left Wound Description (Comments): Left index/middle finger as well as palm.  Seen in PT for the first time on 7/22   Closure None    Drainage Description Serous    Drainage Amount Small    Dressing Type Gauze (Comment);Silver dressings    Dressing Changed Changed    Dressing Status Old drainage    Margins Attached edges (approximated)    Treatment Cleansed;Other (Comment)   debridement, exercise and dressing change.   Wound Therapy - Clinical Statement see below    Wound Therapy - Functional Problem List see below    Factors Delaying/Impairing Wound Healing Infection - systemic/local    Hydrotherapy Plan Debridement;Dressing change;Patient/family education;Other (comment)   exercise.   Wound Therapy - Frequency 2X / week   foir 6 weeks   Wound Therapy - Current Recommendations PT    Wound Therapy - Follow Up Recommendations Other (comment)    Wound Plan exercise, manual and debridement with dressing change to regain functional use    Dressing  Lt index finger vaseline around wound followed by silver hydrofiber, 2x2  and finger wrap.  Finger bandage covered with netting.    Dressing Lt middle finger bandaid and netting to assist keeping in place.        07/13/24: Completed PROM,AAROM, AROM and ressisted ROM in his Lt middle and index finger.  PT given yellow putty and instructed to use 10 x 3 x a day.   PATIENT EDUCATION: Education details: HEP for ROM Person educated: Patient Education method: Explanation, Verbal cues, and Handouts Education comprehension: returned  demonstration   HOME EXERCISE PROGRAM: Access Code: [email or web address] URL: [email or web address] Date: 06/29/2024 Prepared by: Montie Metro  Exercises - Seated Full Fist AROM  - 3 x daily - 7 x weekly - 1 sets - 10 reps - 3 hold - Finger MP Flexion AROM  - 3 x daily - 7 x weekly - 1 sets - 10 reps - 3 hold - Finger Spreading  - 3 x daily - 7 x weekly - 1 sets - 10 reps - 3 hold - Thumb Opposition  - 3 x daily - 7 x weekly - 1 sets - 10 reps - 3 hold -  Wrist AROM Flexion Extension  - 3 x daily - 7 x weekly - 1 sets - 10 reps - 3 hold - Seated Wrist Flexor Hook Fist Tendon Gliding  - 3 x daily - 7 x weekly - 1 sets - 10 reps - 3 hold   GOALS: Goals reviewed with patient? Yes  SHORT TERM GOALS: Target date: 07/20/24  Pt to be completing HEP to have regained 70% of full ROM Baseline: Goal status: IN PROGRESS  2.  Pt wounds to be 100% granulated Baseline:  Goal status: IN PROGRESS  3.  Pt pain to be decreased to no greater than a 2/10 Baseline:  Goal status: IN PROGRESS   LONG TERM GOALS: Target date: 01/11/24  PT wounds on Lt hand to be healed  Baseline:  Goal status: IN PROGRESS  2.  Pt to have no pain in his Lt hand Baseline:  Goal status: IN PROGRESS  3.  Pt to state that his Lt hand is fully functioning.  Baseline:  Goal status: IN PROGRESS  ASSESSMENT:  CLINICAL IMPRESSION:  Debridement of slough on ring finger.  Middle finger only has a very small open area.   AROM of index and middle finger only 25% of normal at this point.  Pt instructed to work with putty daily..    OBJECTIVE IMPAIRMENTS: decreased activity tolerance, decreased ROM, decreased strength, increased edema, increased fascial restrictions, impaired flexibility, pain, and decreased skin integrity.   ACTIVITY LIMITATIONS: carrying, lifting, bathing, dressing, and hygiene/grooming  PARTICIPATION LIMITATIONS: meal prep, cleaning, laundry, driving, shopping, community activity, and yard  work   [facility] POTENTIAL: Good  CLINICAL DECISION MAKING: Stable/uncomplicated  EVALUATION COMPLEXITY: Moderate  PLAN: PT FREQUENCY: 2x/week  PT DURATION: 6 weeks  PLANNED INTERVENTIONS: 97110-Therapeutic exercises, 97535- Self Care, 02859- Manual therapy, and 97597- Wound care (first 20 sq cm)  PLAN FOR NEXT SESSION: continue with irrigation, debridement, exercise including gentle PROM and dressing change.   Montie Metro, PT CLT [phone]  07/13/2024, 10:08 AM

## 2024-07-14 DIAGNOSIS — M6510 Other infective (teno)synovitis, unspecified site: Secondary | ICD-10-CM | POA: Insufficient documentation

## 2024-07-15 ENCOUNTER — Ambulatory Visit: Admitting: Orthopedic Surgery

## 2024-07-15 ENCOUNTER — Ambulatory Visit (HOSPITAL_COMMUNITY): Admitting: Physical Therapy

## 2024-07-15 ENCOUNTER — Encounter: Payer: Self-pay | Admitting: Orthopedic Surgery

## 2024-07-15 DIAGNOSIS — M25642 Stiffness of left hand, not elsewhere classified: Secondary | ICD-10-CM

## 2024-07-15 DIAGNOSIS — S61201A Unspecified open wound of left index finger without damage to nail, initial encounter: Secondary | ICD-10-CM | POA: Diagnosis not present

## 2024-07-15 DIAGNOSIS — M6510 Other infective (teno)synovitis, unspecified site: Secondary | ICD-10-CM

## 2024-07-15 DIAGNOSIS — M79642 Pain in left hand: Secondary | ICD-10-CM

## 2024-07-15 NOTE — Therapy (Signed)
 OUTPATIENT PHYSICAL THERAPY wound Treatment    Patient Name: Jason Walsh MRN: [id number] DOB:1961-09-19, 63 y.o., male Today's Date: 07/15/2024   PCP: Maxwell Ahle REFERRING PROVIDER: Willette Adriana LABOR, MD  END OF SESSION:  PT End of Session - 07/15/24 1005     Visit Number 6    Number of Visits 12    Date for PT Re-Evaluation 08/10/24    Authorization Type NO auth required.    Progress Note Due on Visit 10    PT Start Time 0920    PT Stop Time 1001    PT Time Calculation (min) 41 min    Activity Tolerance Patient tolerated treatment well    Behavior During Therapy WFL for tasks assessed/performed          Past Medical History:  Diagnosis Date   Cancer (HCC) 2010   skin   Lumbar disc disorder    Stroke (HCC) 2016   Past Surgical History:  Procedure Laterality Date   FINGER SURGERY     L index and pinky   IRRIGATION AND DEBRIDEMENT KNEE Right 07/10/2017   Procedure: RIGHT KNEE SCOPE IRRIGATION AND DEBRIDEMENT;  Surgeon: Gerome Charleston, MD;  Location: [facility];  Service: Orthopedics;  Laterality: Right;   MENISCUS REPAIR  09/09/2017   NOSE SURGERY     skin cancer   SKIN CANCER EXCISION Bilateral    basal cell   TENOLYSIS Left 06/22/2024   Procedure: INCISION, TENDON SHEATH;  Surgeon: Margrette Taft BRAVO, MD;  Location: [facility];  Service: Orthopedics;  Laterality: Left;  incision and drainage left middle/long finger and debridement left index finger   Patient Active Problem List   Diagnosis Date Noted   Infectious tenosynovitis LEFT LONG FINGER 06/22/24 07/14/2024   Mixed sensory-motor polyneuropathy 07/12/2024   Cellulitis of finger of left hand 06/22/2024   Laceration of left index finger with foreign body and damage to nail 06/22/2024   History of stroke 09/17/2022   History of substance use 09/17/2022   Cancer of skin of face 09/17/2022   Chronic right-sided low back pain with right-sided sciatica 09/17/2022   Essential hypertension 09/17/2022    Hyperlipidemia 01/04/2015   Tobacco abuse 01/03/2015   Personal history of skin cancer 03/31/2012   Basal cell carcinoma of nose 01/23/2012    ONSET DATE: 06/12/24  REFERRING DIAG:  Diagnosis  L03.012 (ICD-10-CM) - Cellulitis of finger of left hand  S61.321A (ICD-10-CM) - Laceration of left index finger with foreign body and damage to nail, initial encounter    THERAPY DIAG:  Diagnosis  L03.012 (ICD-10-CM) - Cellulitis of finger of left hand  S61.321A (ICD-10-CM) - Laceration of left index finger with foreign body and damage to nail, initial encounter   Lt hand pain Decreased ROM of left hand   Rationale for Evaluation and Treatment: Rehabilitation Pt admitted to hospital had an I&D on 7/15 and was discharged on 06/24/24.    06/12/24:  per ER:  Jason Walsh is a 63 y.o. male with PMH as listed below who presents with laceration to L index finger d/t table saw.  PT wounds sutured Pt returned to ER on 7/14:  Per ER  Jason Walsh is a 53 with no significant past medical history with exception of skin cancer chronic on left ear presented to the ED with a chief complaint of erythema edema to his left index finger. About 9 days ago on July 5 he had a accidental laceration with tablesaw to the dorsal aspect of  the finger. At the time laceration was sutured dressed.. Now is progressively worsening with edema erythema was extending to his 2nd and 3rd digit onto his palm and hand area. Third index pending there is edematous unable to flex or extend due to severe pain. Second digit the wound is opened up and draining.   07/15/24 0001  Subjective Assessment  Subjective Pt states that he has been working with the exercises at home.  Patient and Family Stated Goals full use of hand no pain  Date of Onset 06/12/24  Prior Treatments ER visits, IP, I&D  Pain Assessment  Pain Scale 0-10  Pain Score 4  Pain Type Acute pain  Evaluation and Treatment  Evaluation and Treatment Procedures Explained to  Patient/Family Yes  Evaluation and Treatment Procedures agreed to  Wound 06/22/24 1533 Surgical Closed Surgical Incision Hand Left  Date First Assessed/Time First Assessed: 06/22/24 1533   Primary Wound Type: Surgical  Secondary Wound Type - Surgical: Closed Surgical Incision  Location: Hand  Location Orientation: Left  Wound Description (Comments): Left index/middle finger as wel...  Wound Image     Site / Wound Assessment Clean;Pink;Yellow  Closure None  Drainage Description Serous  Drainage Amount Small  Dressing Type Gauze (Comment);Silver dressings  Dressing Changed Changed  Dressing Status Old drainage  Margins Attached edges (approximated)  Treatment Cleansed;Other (Comment) (debridement to wound site.)  Wound Therapy - Assess/Plan/Recommendations  Wound Therapy - Clinical Statement see below  Wound Therapy - Functional Problem List see below  Factors Delaying/Impairing Wound Healing Infection - systemic/local  Hydrotherapy Plan Debridement;Dressing change;Patient/family education;Other (comment) (exercise.)  Wound Therapy - Frequency 2X / week (foir 6 weeks)  Wound Therapy - Current Recommendations PT  Wound Therapy - Follow Up Recommendations Other (comment)  Wound Plan exercise, manual and debridement with dressing change to regain functional use  Wound Therapy  Dressing  Lt index finger:  xeroform 2x2, finger bandage and coban followed bynetting  Dressing Lt middle finger bandaid and coban to assist keeping in place.  Manual Therapy to scar    07/15/24: Completed PROM,AAROM, AROM and ressisted ROM in his Lt middle and index finger.  Education details: HEP for ROM Person educated: Patient Education method: Explanation, Verbal cues, and Handouts Education comprehension: returned demonstration   HOME EXERCISE PROGRAM: Access Code: [email or web address] URL: [email or web address] Date: 06/29/2024 Prepared by: Montie Metro  Exercises - Seated Full Fist AROM  - 3 x  daily - 7 x weekly - 1 sets - 10 reps - 3 hold - Finger MP Flexion AROM  - 3 x daily - 7 x weekly - 1 sets - 10 reps - 3 hold - Finger Spreading  - 3 x daily - 7 x weekly - 1 sets - 10 reps - 3 hold - Thumb Opposition  - 3 x daily - 7 x weekly - 1 sets - 10 reps - 3 hold - Wrist AROM Flexion Extension  - 3 x daily - 7 x weekly - 1 sets - 10 reps - 3 hold - Seated Wrist Flexor Hook Fist Tendon Gliding  - 3 x daily - 7 x weekly - 1 sets - 10 reps - 3 hold   GOALS: Goals reviewed with patient? Yes  SHORT TERM GOALS: Target date: 07/20/24  Pt to be completing HEP to have regained 70% of full ROM Baseline: Goal status: IN PROGRESS  2.  Pt wounds to be 100% granulated Baseline:  Goal status: IN PROGRESS  3.  Pt pain to be decreased  to no greater than a 2/10 Baseline:  Goal status: IN PROGRESS   LONG TERM GOALS: Target date: 01/11/24  PT wounds on Lt hand to be healed  Baseline:  Goal status: IN PROGRESS  2.  Pt to have no pain in his Lt hand Baseline:  Goal status: IN PROGRESS  3.  Pt to state that his Lt hand is fully functioning.  Baseline:  Goal status: IN PROGRESS  ASSESSMENT:  CLINICAL IMPRESSION:  Debridement of slough on ring finger.  Noted increased drainage of ring finger therefore therapist used silver hydrofiber for dressing.  Middle finger only has a very small open area.   PT states he plans on working outside this weekend therefore therapist covered area with coban.  AROM of index and middle finger only 25% of normal at this point.  Continued with PROM, AAROM and AROM.  Pt instructed to work with putty daily..    OBJECTIVE IMPAIRMENTS: decreased activity tolerance, decreased ROM, decreased strength, increased edema, increased fascial restrictions, impaired flexibility, pain, and decreased skin integrity.   ACTIVITY LIMITATIONS: carrying, lifting, bathing, dressing, and hygiene/grooming  PARTICIPATION LIMITATIONS: meal prep, cleaning, laundry, driving,  shopping, community activity, and yard work   [facility] POTENTIAL: Good  CLINICAL DECISION MAKING: Stable/uncomplicated  EVALUATION COMPLEXITY: Moderate  PLAN: PT FREQUENCY: 2x/week  PT DURATION: 6 weeks  PLANNED INTERVENTIONS: 97110-Therapeutic exercises, 97535- Self Care, 02859- Manual therapy, and 97597- Wound care (first 20 sq cm)  PLAN FOR NEXT SESSION: continue with irrigation, debridement, exercise including gentle PROM and dressing change.   Montie Metro, PT CLT [phone]  07/15/2024, 10:09 AM

## 2024-07-15 NOTE — Progress Notes (Signed)
  Postop visit #2 Chief Complaint  Patient presents with   Post-op Follow-up    Hand left/ wrapped today / has asked to not have them unwrapped states they were just wrapped and photographed today     Encounter Diagnosis  Name Primary?   Infectious tenosynovitis LEFT LONG FINGER 06/22/24 Yes    DOI/DOS/ Date: 06/22/24  Improved  Jason Walsh continues to improve in terms of skin coverage over his index finger but he still very stiff in both fingers and complains of some locking type sensation and pain over the A1 pulley of the long finger.  We will continue with dressing changes and attempts to regain range of motion and follow-up in 3 weeks

## 2024-07-15 NOTE — Progress Notes (Signed)
   There were no vitals taken for this visit.  There is no height or weight on file to calculate BMI.  Chief Complaint  Patient presents with   Post-op Follow-up    Hand left/ wrapped today / has asked to not have them unwrapped states they were just wrapped and photographed today     Encounter Diagnosis  Name Primary?   Infectious tenosynovitis LEFT LONG FINGER 06/22/24 Yes    DOI/DOS/ Date: 06/22/24  Improved

## 2024-07-19 ENCOUNTER — Encounter: Admitting: Orthopedic Surgery

## 2024-07-20 ENCOUNTER — Ambulatory Visit (HOSPITAL_COMMUNITY): Admitting: Physical Therapy

## 2024-07-20 DIAGNOSIS — M25642 Stiffness of left hand, not elsewhere classified: Secondary | ICD-10-CM

## 2024-07-20 DIAGNOSIS — S61201A Unspecified open wound of left index finger without damage to nail, initial encounter: Secondary | ICD-10-CM | POA: Diagnosis not present

## 2024-07-20 DIAGNOSIS — M79642 Pain in left hand: Secondary | ICD-10-CM

## 2024-07-20 NOTE — Therapy (Signed)
 OUTPATIENT PHYSICAL THERAPY wound Treatment    Patient Name: Jason Walsh MRN: [id number] DOB:Feb 25, 1961, 63 y.o., male Today's Date: 07/20/2024   PCP: Jason Walsh REFERRING PROVIDER: Willette Adriana LABOR, Walsh  END OF SESSION:  PT End of Session - 07/20/24 0954     Visit Number 7    Number of Visits 12    Date for PT Re-Evaluation 08/10/24    Authorization Type NO auth required.    Progress Note Due on Visit 10    PT Start Time 0915    PT Stop Time [phone]    PT Time Calculation (min) 31 min    Activity Tolerance Patient tolerated treatment well    Behavior During Therapy [facility] for tasks assessed/performed          Past Medical History:  Diagnosis Date   Cancer (HCC) 2010   skin   Lumbar disc disorder    Stroke (HCC) 2016   Past Surgical History:  Procedure Laterality Date   FINGER SURGERY     L index and pinky   IRRIGATION AND DEBRIDEMENT KNEE Right 07/10/2017   Procedure: RIGHT KNEE SCOPE IRRIGATION AND DEBRIDEMENT;  Surgeon: Jason Charleston, Walsh;  Location: [facility];  Service: Orthopedics;  Laterality: Right;   MENISCUS REPAIR  09/09/2017   NOSE SURGERY     skin cancer   SKIN CANCER EXCISION Bilateral    basal cell   TENOLYSIS Left 06/22/2024   Procedure: INCISION, TENDON SHEATH;  Surgeon: Jason Taft BRAVO, Walsh;  Location: [facility];  Service: Orthopedics;  Laterality: Left;  incision and drainage left middle/long finger and debridement left index finger   Patient Active Problem List   Diagnosis Date Noted   Infectious tenosynovitis LEFT LONG FINGER 06/22/24 07/14/2024   Mixed sensory-motor polyneuropathy 07/12/2024   Cellulitis of finger of left hand 06/22/2024   Laceration of left index finger with foreign body and damage to nail 06/22/2024   History of stroke 09/17/2022   History of substance use 09/17/2022   Cancer of skin of face 09/17/2022   Chronic right-sided low back pain with right-sided sciatica 09/17/2022   Essential hypertension 09/17/2022    Hyperlipidemia 01/04/2015   Tobacco abuse 01/03/2015   Personal history of skin cancer 03/31/2012   Basal cell carcinoma of nose 01/23/2012    ONSET DATE: 06/12/24  REFERRING DIAG:  Diagnosis  L03.012 (ICD-10-CM) - Cellulitis of finger of left hand  S61.321A (ICD-10-CM) - Laceration of left index finger with foreign body and damage to nail, initial encounter    THERAPY DIAG:  Diagnosis  L03.012 (ICD-10-CM) - Cellulitis of finger of left hand  S61.321A (ICD-10-CM) - Laceration of left index finger with foreign body and damage to nail, initial encounter   Lt hand pain Decreased ROM of left hand   Rationale for Evaluation and Treatment: Rehabilitation Pt admitted to hospital had an I&D on 7/15 and was discharged on 06/24/24.    06/12/24:  per ER:  Jason Walsh is a 63 y.o. male with PMH as listed below who presents with laceration to L index finger d/t table saw.  PT wounds sutured Pt returned to ER on 7/14:  Per ER  Jason Walsh is a 59 with no significant past medical history with exception of skin cancer chronic on left ear presented to the ED with a chief complaint of erythema edema to his left index finger. About 9 days ago on July 5 he had a accidental laceration with tablesaw to the dorsal aspect of  the finger. At the time laceration was sutured dressed.. Now is progressively worsening with edema erythema was extending to his 2nd and 3rd digit onto his palm and hand area. Third index pending there is edematous unable to flex or extend due to severe pain. Second digit the wound is opened up and draining.  07/20/24 0001  Subjective Assessment  Subjective PT states that he did some work over the weekend and noted that his middle finger has a pus area coming up on it and his index finger wound broke open  Patient and Family Stated Goals full use of hand no pain  Date of Onset 06/12/24  Prior Treatments ER visits, IP, I&D  Pain Assessment  Pain Scale 0-10  Pain Score 5  Pain Type  Acute pain  Evaluation and Treatment  Evaluation and Treatment Procedures Explained to Patient/Family Yes  Evaluation and Treatment Procedures agreed to  Wound 06/22/24 1533 Surgical Closed Surgical Incision Hand Left  Date First Assessed/Time First Assessed: 06/22/24 1533   Primary Wound Type: Surgical  Secondary Wound Type - Surgical: Closed Surgical Incision  Location: Hand  Location Orientation: Left  Wound Description (Comments): Left index/middle finger as wel...  Site / Wound Assessment Clean;Pink;Yellow  Wound Length (cm) 1 cm (x .8 for index finger.  Dorsal middle finger now .5x.3 x.2)  Closure None  Drainage Description Serous;Purulent  Drainage Amount Small  Dressing Type Gauze (Comment);Silver dressings  Dressing Changed Changed  Dressing Status Old drainage  Margins Attached edges (approximated)  Treatment Cleansed;Other (Comment) (cleansing and debridement of both fingers.)  Wound Therapy - Assess/Plan/Recommendations  Wound Therapy - Clinical Statement see below  Wound Therapy - Functional Problem List see below  Factors Delaying/Impairing Wound Healing Infection - systemic/local  Hydrotherapy Plan Debridement;Dressing change;Patient/family education;Other (comment) (exercise.)  Wound Therapy - Frequency 2X / week (foir 6 weeks)  Wound Therapy - Current Recommendations PT  Wound Therapy - Follow Up Recommendations Other (comment)  Wound Plan exercise, manual and debridement with dressing change to regain functional use  Wound Therapy  Dressing  Lt index finger:  xeroform 2x2, finger bandage and coban followed bynetting  Dressing Lt middle finger: silverhydrofiber to open area where noted purulent drainage, 2x2, finger bandage, coban and netting. assist keeping in place.      HOME EXERCISE PROGRAM: Access Code: [email or web address] URL: [email or web address] Date: 06/29/2024 Prepared by: Jason Walsh  Exercises - Seated Full Fist AROM  - 3 x daily - 7 x  weekly - 1 sets - 10 reps - 3 hold - Finger MP Flexion AROM  - 3 x daily - 7 x weekly - 1 sets - 10 reps - 3 hold - Finger Spreading  - 3 x daily - 7 x weekly - 1 sets - 10 reps - 3 hold - Thumb Opposition  - 3 x daily - 7 x weekly - 1 sets - 10 reps - 3 hold - Wrist AROM Flexion Extension  - 3 x daily - 7 x weekly - 1 sets - 10 reps - 3 hold - Seated Wrist Flexor Hook Fist Tendon Gliding  - 3 x daily - 7 x weekly - 1 sets - 10 reps - 3 hold   GOALS: Goals reviewed with patient? Yes  SHORT TERM GOALS: Target date: 07/20/24  Pt to be completing HEP to have regained 70% of full ROM Baseline: Goal status: IN PROGRESS  2.  Pt wounds to be 100% granulated Baseline:  Goal status: IN PROGRESS  3.  Pt pain  to be decreased to no greater than a 2/10 Baseline:  Goal status: IN PROGRESS   LONG TERM GOALS: Target date: 01/11/24  PT wounds on Lt hand to be healed  Baseline:  Goal status: IN PROGRESS  2.  Pt to have no pain in his Lt hand Baseline:  Goal status: IN PROGRESS  3.  Pt to state that his Lt hand is fully functioning.  Baseline:  Goal status: IN PROGRESS  ASSESSMENT:  CLINICAL IMPRESSION:  Debridement of slough on ring finger; purulent drainage and devitalized tissue on opening on the dorsal aspect of pt middle finger.  PT wound size has increased on both fingers with noted increased purulent drainage therefore therapist held off on exercises today.   OBJECTIVE IMPAIRMENTS: decreased activity tolerance, decreased ROM, decreased strength, increased edema, increased fascial restrictions, impaired flexibility, pain, and decreased skin integrity.   ACTIVITY LIMITATIONS: carrying, lifting, bathing, dressing, and hygiene/grooming  PARTICIPATION LIMITATIONS: meal prep, cleaning, laundry, driving, shopping, community activity, and yard work   [facility] POTENTIAL: Good  CLINICAL DECISION MAKING: Stable/uncomplicated  EVALUATION COMPLEXITY: Moderate  PLAN: PT FREQUENCY:  2x/week  PT DURATION: 6 weeks  PLANNED INTERVENTIONS: 97110-Therapeutic exercises, 97535- Self Care, 02859- Manual therapy, and 97597- Wound care (first 20 sq cm)  PLAN FOR NEXT SESSION: continue with irrigation, debridement, exercise including gentle PROM and dressing change.   Jason Walsh, PT CLT [phone]  07/20/2024, 10:00 AM

## 2024-07-21 ENCOUNTER — Ambulatory Visit (INDEPENDENT_AMBULATORY_CARE_PROVIDER_SITE_OTHER): Admitting: Family Medicine

## 2024-07-21 ENCOUNTER — Ambulatory Visit (HOSPITAL_COMMUNITY): Admitting: Physical Therapy

## 2024-07-21 VITALS — BP 151/75 | HR 73 | Temp 98.4°F | Ht 69.0 in | Wt 176.0 lb

## 2024-07-21 DIAGNOSIS — S61201A Unspecified open wound of left index finger without damage to nail, initial encounter: Secondary | ICD-10-CM | POA: Diagnosis not present

## 2024-07-21 DIAGNOSIS — M25642 Stiffness of left hand, not elsewhere classified: Secondary | ICD-10-CM

## 2024-07-21 DIAGNOSIS — L03114 Cellulitis of left upper limb: Secondary | ICD-10-CM | POA: Diagnosis not present

## 2024-07-21 MED ORDER — DOXYCYCLINE HYCLATE 100 MG PO TABS: 100.0000 mg | ORAL_TABLET | Freq: Two times a day (BID) | ORAL | 0 refills | Status: DC

## 2024-07-21 NOTE — Patient Instructions (Signed)
 Stop clindamycin . Start Doxycyline.  If this worsens or does not improve, go directly to the hospital.

## 2024-07-21 NOTE — Assessment & Plan Note (Signed)
 Stopping clindamycin .  Starting doxycycline .  He is culture from his surgery revealed MRSA which was sensitive to doxycycline .  Advised to go to the ER if he fails to improve or worsens as he will need IV antibiotics.

## 2024-07-21 NOTE — Progress Notes (Signed)
 Subjective:  Patient ID: Jason Walsh, male    DOB: December 14, 1960  Age: 63 y.o. MRN: [id number]  CC:   Chief Complaint  Patient presents with   Hand Problem    Swelling and pain left hand increased     HPI:  63 year old male presents for evaluation of the above.  Patient has had a recent hospitalization related to injury to the finger and subsequent cellulitis of the left hand.  Had surgery on 7/15.  Was seen in the ED on 7/30 for wound infection.  Was placed on clindamycin .  Patient states that he is still on clindamycin .  Now having swelling of the left third digit which extends proximally on the dorsum of the hand.  Increasing pain, warmth, and redness as well.  He believes that this area is infected.  No fever.  Some drainage.  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   History of stroke 09/17/2022   History of substance use 09/17/2022   Cancer of skin of face 09/17/2022   Chronic right-sided low back pain with right-sided sciatica 09/17/2022   Essential hypertension 09/17/2022   Hyperlipidemia 01/04/2015   Tobacco abuse 01/03/2015   Personal history of skin cancer 03/31/2012   Basal cell carcinoma of nose 01/23/2012    Social Hx   Social History   Socioeconomic History   Marital status: Legally Separated    Spouse name: Not on file   Number of children: Not on file   Years of education: Not on file   Highest education level: Not on file  Occupational History   Not on file  Tobacco Use   Smoking status: Every Day    Current packs/day: 1.00    Average packs/day: 1 pack/day for 55.5 years (55.5 ttl pk-yrs)    Types: Cigarettes    Start date: 02/02/1969   Smokeless tobacco: Never  Vaping Use   Vaping status: Never Used  Substance and Sexual Activity   Alcohol use: Not Currently    Comment: occasional   Drug  use: Yes    Types: Marijuana, Cocaine    Comment: occasional .  no cocaine sine 2016   Sexual activity: Yes    Partners: Female  Other Topics Concern   Not on file  Social History Narrative   Right Handed    Lives in one story home. Lives alone   Social Drivers of Health   Financial Resource Strain: Low Risk  (01/15/2019)   Overall Financial Resource Strain (CARDIA)    Difficulty of Paying Living Expenses: Not very hard  Food Insecurity: No Food Insecurity (06/21/2024)   Hunger Vital Sign    Worried About Running Out of Food in the Last Year: Never true    Ran Out of Food in the Last Year: Never true  Transportation Needs: No Transportation Needs (06/21/2024)   PRAPARE - Administrator, Civil Service (Medical): No    Lack of Transportation (Non-Medical): No  Physical Activity: Not on file  Stress: No Stress Concern Present (01/15/2019)   [employer] of Occupational Health - Occupational Stress Questionnaire    Feeling of Stress : Only a little  Social Connections: Not on file    Review of Systems Per HPI  Objective:  BP (!) 151/75   Pulse 73   Temp 98.4 F (36.9 C)   Ht  5' 9 (1.753 m)   Wt 176 lb (79.8 kg)   SpO2 100%   BMI 25.99 kg/m      07/21/2024    2:07 PM 07/12/2024    9:45 AM 07/07/2024   11:30 PM  BP/Weight  Systolic BP 151 144 132  Diastolic BP 75 82 60  Wt. (Lbs) 176 168   BMI 25.99 kg/m2 24.81 kg/m2     Physical Exam Constitutional:      General: He is not in acute distress. HENT:     Head: Normocephalic and atraumatic.  Pulmonary:     Effort: Pulmonary effort is normal. No respiratory distress.  Skin:    Comments: Left hand -swelling of the index and middle fingers.  Erythema noted to the left middle finger.  Swelling noted as well.  Swelling and redness extends proximally to the dorsum of the hand.  See picture.  Neurological:     Mental Status: He is alert.     Lab Results  Component Value Date   WBC 7.8 06/24/2024   HGB  13.2 06/24/2024   HCT 39.5 06/24/2024   PLT 283 06/24/2024   GLUCOSE 102 (H) 06/24/2024   CHOL 133 06/22/2024   TRIG 93 06/22/2024   HDL 33 (L) 06/22/2024   LDLCALC 81 06/22/2024   ALT 13 04/22/2024   AST 17 04/22/2024   NA 139 06/24/2024   K 3.6 06/24/2024   CL 104 06/24/2024   CREATININE 1.19 06/24/2024   BUN 20 06/24/2024   CO2 27 06/24/2024   INR 0.92 01/14/2019   HGBA1C 5.5 09/17/2022     Assessment & Plan:  Cellulitis of left hand Assessment & Plan: Stopping clindamycin .  Starting doxycycline .  He is culture from his surgery revealed MRSA which was sensitive to doxycycline .  Advised to go to the ER if he fails to improve or worsens as he will need IV antibiotics.   Other orders -     Doxycycline  Hyclate; Take 1 tablet (100 mg total) by mouth 2 (two) times daily.  Dispense: 20 tablet; Refill: 0    Follow-up:  Return if symptoms worsen or fail to improve.  Jacqulyn Ahle DO [facility]

## 2024-07-21 NOTE — Therapy (Addendum)
 OUTPATIENT PHYSICAL THERAPY wound Treatment    Patient Name: Jason Walsh MRN: [id number] DOB:1961/03/18, 63 y.o., male Today's Date: 07/21/2024   PCP: Maxwell Ahle REFERRING PROVIDER: Willette Adriana LABOR, MD  END OF SESSION:  PT End of Session - 07/21/24 1054     Visit Number 8    Number of Visits 12    Date for PT Re-Evaluation 08/10/24    Authorization Type NO auth required.    Progress Note Due on Visit 10    PT Start Time 0800    PT Stop Time 0820    PT Time Calculation (min) 20 min    Activity Tolerance Patient tolerated treatment well    Behavior During Therapy [facility] for tasks assessed/performed          Past Medical History:  Diagnosis Date   Cancer (HCC) 2010   skin   Lumbar disc disorder    Stroke (HCC) 2016   Past Surgical History:  Procedure Laterality Date   FINGER SURGERY     L index and pinky   IRRIGATION AND DEBRIDEMENT KNEE Right 07/10/2017   Procedure: RIGHT KNEE SCOPE IRRIGATION AND DEBRIDEMENT;  Surgeon: Gerome Charleston, MD;  Location: [facility];  Service: Orthopedics;  Laterality: Right;   MENISCUS REPAIR  09/09/2017   NOSE SURGERY     skin cancer   SKIN CANCER EXCISION Bilateral    basal cell   TENOLYSIS Left 06/22/2024   Procedure: INCISION, TENDON SHEATH;  Surgeon: Margrette Taft BRAVO, MD;  Location: [facility];  Service: Orthopedics;  Laterality: Left;  incision and drainage left middle/long finger and debridement left index finger   Patient Active Problem List   Diagnosis Date Noted   Infectious tenosynovitis LEFT LONG FINGER 06/22/24 07/14/2024   Mixed sensory-motor polyneuropathy 07/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   Cellulitis of finger of left hand 06/22/2024   Laceration of left index finger with foreign body and damage to nail 06/22/2024   History of stroke 09/17/2022   History of substance use 09/17/2022   Cancer of skin of face 09/17/2022   Chronic right-sided low back pain with right-sided sciatica 09/17/2022   Essential hypertension 09/17/2022    Hyperlipidemia 01/04/2015   Tobacco abuse 01/03/2015   Personal history of skin cancer 03/31/2012   Basal cell carcinoma of nose 01/23/2012    ONSET DATE: 06/12/24  REFERRING DIAG:  Diagnosis  L03.012 (ICD-10-CM) - Cellulitis of finger of left hand  S61.321A (ICD-10-CM) - Laceration of left index finger with foreign body and damage to nail, initial encounter    THERAPY DIAG:  Diagnosis  L03.012 (ICD-10-CM) - Cellulitis of finger of left hand  S61.321A (ICD-10-CM) - Laceration of left index finger with foreign body and damage to nail, initial encounter   Lt hand pain Decreased ROM of left hand   Rationale for Evaluation and Treatment: Rehabilitation Pt admitted to hospital had an I&D on 7/15 and was discharged on 06/24/24.    06/12/24:  per ER:  Jason Walsh is a 63 y.o. male with PMH as listed below who presents with laceration to L index finger d/t table saw.  PT wounds sutured Pt returned to ER on 7/14:  Per ER  Jason Walsh is a 29 with no significant past medical history with exception of skin cancer chronic on left ear presented to the ED with a chief complaint of erythema edema to his left index finger. About 9 days ago on July 5 he had a accidental laceration with tablesaw to the dorsal aspect of  the finger. At the time laceration was sutured dressed.. Now is progressively worsening with edema erythema was extending to his 2nd and 3rd digit onto his palm and hand area. Third index pending there is edemat  07/21/24 0001  Subjective Assessment  Subjective Pt comes to department stating that his hand is swelling and is painful, wants the dressing cut off.  Patient and Family Stated Goals full use of hand no pain  Date of Onset 06/12/24  Prior Treatments ER visits, IP, I&D  Pain Assessment  Pain Scale 0-10  Pain Score 7  Pain Type Acute pain  Evaluation and Treatment  Evaluation and Treatment Procedures Explained to Patient/Family Yes  Evaluation and Treatment Procedures agreed  to  Wound 06/22/24 1533 Surgical Closed Surgical Incision Hand Left  Date First Assessed/Time First Assessed: 06/22/24 1533   Primary Wound Type: Surgical  Secondary Wound Type - Surgical: Closed Surgical Incision  Location: Hand  Location Orientation: Left  Wound Description (Comments): Left index/.  Seen in PT for th...  Site / Wound Assessment Clean;Yellow;Painful;Pale  Closure None  Drainage Description Serous;Purulent (Pt middle finger now has a larger wound upon removal of dressing purulent drainage noted.)  Drainage Amount Small  Dressing Type Gauze (Comment);Silver dressings  Dressing Changed Changed  Dressing Status Old drainage  Margins Attached edges (approximated)  Treatment Cleansed  Wound Therapy - Assess/Plan/Recommendations  Wound Therapy - Clinical Statement see below  Wound Therapy - Functional Problem List see below  Factors Delaying/Impairing Wound Healing Infection - systemic/local  Hydrotherapy Plan Debridement;Dressing change;Patient/family education;Other (comment) (exercise.)  Wound Therapy - Frequency 2X / week (foir 6 weeks)  Wound Therapy - Current Recommendations PT  Wound Therapy - Follow Up Recommendations Other (comment)  Wound Plan exercise, manual and debridement with dressing change to regain functional use  Wound Therapy  Dressing  Lt index and middle finger:  vaseline applied around wound, silver hydrofiber placed on wound beds followed by 2x2, dressed loosley with 2 gauze netting placed on fingers to assist in keeping dressing in place.  ous unable to flex or extend due to severe pain. Second digit the wound is opened up and draining.  HOME EXERCISE PROGRAM: Access Code: [email or web address] URL: [email or web address] Date: 06/29/2024 Prepared by: Montie Metro  Exercises - Seated Full Fist AROM  - 3 x daily - 7 x weekly - 1 sets - 10 reps - 3 hold - Finger MP Flexion AROM  - 3 x daily - 7 x weekly - 1 sets - 10 reps - 3 hold - Finger  Spreading  - 3 x daily - 7 x weekly - 1 sets - 10 reps - 3 hold - Thumb Opposition  - 3 x daily - 7 x weekly - 1 sets - 10 reps - 3 hold - Wrist AROM Flexion Extension  - 3 x daily - 7 x weekly - 1 sets - 10 reps - 3 hold - Seated Wrist Flexor Hook Fist Tendon Gliding  - 3 x daily - 7 x weekly - 1 sets - 10 reps - 3 hold   GOALS: Goals reviewed with patient? Yes  SHORT TERM GOALS: Target date: 07/20/24  Pt to be completing HEP to have regained 70% of full ROM Baseline: Goal status: IN PROGRESS  2.  Pt wounds to be 100% granulated Baseline:  Goal status: IN PROGRESS  3.  Pt pain to be decreased to no greater than a 2/10 Baseline:  Goal status: IN PROGRESS   LONG TERM GOALS: Target date:  01/11/24  PT wounds on Lt hand to be healed  Baseline:  Goal status: IN PROGRESS  2.  Pt to have no pain in his Lt hand Baseline:  Goal status: IN PROGRESS  3.  Pt to state that his Lt hand is fully functioning.  Baseline:  Goal status: IN PROGRESS  ASSESSMENT:  CLINICAL IMPRESSION:  Pt to department without an appointment stating increased swelling and pain.  Therapist removed dressing to note increase size of wound on middle finger with purulent drainage.  Index finger slightly macerated around edges.  Therapist cleansed area changed dressing to silver hydrofiber followed by 2x2 and 2 gauze.  Therapist encouraged pt to call MD even though pt is on antibiotic as he may need a different antibiotic as there had not been purulent drainage in the middle finger prior to yesterday.   OBJECTIVE IMPAIRMENTS: decreased activity tolerance, decreased ROM, decreased strength, increased edema, increased fascial restrictions, impaired flexibility, pain, and decreased skin integrity.   ACTIVITY LIMITATIONS: carrying, lifting, bathing, dressing, and hygiene/grooming  PARTICIPATION LIMITATIONS: meal prep, cleaning, laundry, driving, shopping, community activity, and yard work   [facility] POTENTIAL:  Good  CLINICAL DECISION MAKING: Stable/uncomplicated  EVALUATION COMPLEXITY: Moderate  PLAN: PT FREQUENCY: 2x/week  PT DURATION: 6 weeks  PLANNED INTERVENTIONS: 97110-Therapeutic exercises, 97535- Self Care, 02859- Manual therapy, and 97597- Wound care (first 20 sq cm)  PLAN FOR NEXT SESSION: continue with irrigation, debridement, exercise including gentle PROM and dressing change.   Montie Metro, PT CLT [phone]  07/21/2024, 11:03 AM

## 2024-07-22 ENCOUNTER — Ambulatory Visit (INDEPENDENT_AMBULATORY_CARE_PROVIDER_SITE_OTHER): Admitting: Physical Therapy

## 2024-07-22 DIAGNOSIS — M79642 Pain in left hand: Secondary | ICD-10-CM

## 2024-07-22 DIAGNOSIS — M25642 Stiffness of left hand, not elsewhere classified: Secondary | ICD-10-CM

## 2024-07-22 DIAGNOSIS — S61201A Unspecified open wound of left index finger without damage to nail, initial encounter: Secondary | ICD-10-CM | POA: Diagnosis not present

## 2024-07-22 DIAGNOSIS — S61203A Unspecified open wound of left middle finger without damage to nail, initial encounter: Secondary | ICD-10-CM

## 2024-07-22 NOTE — Therapy (Signed)
 OUTPATIENT PHYSICAL THERAPY wound Treatment    Patient Name: Jason Walsh MRN: [id number] DOB:1961/11/12, 63 y.o., male Today's Date: 07/22/2024   PCP: Maxwell Ahle REFERRING PROVIDER: Willette Adriana LABOR, MD  END OF SESSION:  PT End of Session - 07/22/24 0838     Visit Number 9    Number of Visits 12    Date for PT Re-Evaluation 08/10/24    Authorization Type NO auth required.    Progress Note Due on Visit 10    PT Start Time 0817    PT Stop Time [phone]    PT Time Calculation (min) 19 min    Activity Tolerance Patient tolerated treatment well    Behavior During Therapy [facility] for tasks assessed/performed          Past Medical History:  Diagnosis Date   Cancer (HCC) 2010   skin   Lumbar disc disorder    Stroke (HCC) 2016   Past Surgical History:  Procedure Laterality Date   FINGER SURGERY     L index and pinky   IRRIGATION AND DEBRIDEMENT KNEE Right 07/10/2017   Procedure: RIGHT KNEE SCOPE IRRIGATION AND DEBRIDEMENT;  Surgeon: Gerome Charleston, MD;  Location: [facility];  Service: Orthopedics;  Laterality: Right;   MENISCUS REPAIR  09/09/2017   NOSE SURGERY     skin cancer   SKIN CANCER EXCISION Bilateral    basal cell   TENOLYSIS Left 06/22/2024   Procedure: INCISION, TENDON SHEATH;  Surgeon: Margrette Taft BRAVO, MD;  Location: [facility];  Service: Orthopedics;  Laterality: Left;  incision and drainage left middle/long finger and debridement left index finger   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   History of stroke 09/17/2022   History of substance use 09/17/2022   Cancer of skin of face 09/17/2022   Chronic right-sided low back pain with right-sided sciatica 09/17/2022   Essential hypertension 09/17/2022   Hyperlipidemia 01/04/2015   Tobacco abuse 01/03/2015   Personal history of skin cancer 03/31/2012    Basal cell carcinoma of nose 01/23/2012    ONSET DATE: 06/12/24  REFERRING DIAG:  Diagnosis  L03.012 (ICD-10-CM) - Cellulitis of finger of left hand  S61.321A (ICD-10-CM) - Laceration of left index finger with foreign body and damage to nail, initial encounter    THERAPY DIAG:  Diagnosis  L03.012 (ICD-10-CM) - Cellulitis of finger of left hand  S61.321A (ICD-10-CM) - Laceration of left index finger with foreign body and damage to nail, initial encounter   Lt hand pain Decreased ROM of left hand   Rationale for Evaluation and Treatment: Rehabilitation Pt admitted to hospital had an I&D on 7/15 and was discharged on 06/24/24.    06/12/24:  per ER:  Jason Walsh is a 63 y.o. male with PMH as listed below who presents with laceration to L index finger d/t table saw.  PT wounds sutured Pt returned to ER on 7/14:  Per ER  Jason Walsh is a 38 with no significant past medical history with exception of skin cancer chronic on left ear presented to the ED with a chief complaint of erythema edema to his left index finger. About 9 days ago on July 5 he had a accidental laceration with tablesaw to the dorsal aspect of the finger. At the time laceration was sutured dressed.. Now is progressively worsening with edema erythema was  extending to his 2nd and 3rd digit onto his palm and hand area. Third index pending there is edematous unable to flex or extend due to severe pain. Second digit the wound is opened up and draining.   07/22/24 0001  Subjective Assessment  Subjective Pt states that he went to his primary MD and got his antibiotics changed.  He just started the new antibiotic this morning.  Patient and Family Stated Goals full use of hand no pain  Date of Onset 06/12/24  Prior Treatments ER visits, IP, I&D  Pain Assessment  Pain Scale 0-10  Pain Score 7  Pain Type Acute pain  Evaluation and Treatment  Evaluation and Treatment Procedures Explained to Patient/Family Yes  Evaluation and  Treatment Procedures agreed to  Wound 06/22/24 1533 Surgical Closed Surgical Incision Hand Left  Date First Assessed/Time First Assessed: 06/22/24 1533   Primary Wound Type: Surgical  Secondary Wound Type - Surgical: Closed Surgical Incision  Location: Hand  Location Orientation: Left  Wound Description (Comments): Left index/.  Seen in PT for th...  Wound Image    Site / Wound Assessment Painful;Pale  Wound Length (cm) 1.2 cm (width and depth/ .4x.3 located on lateral aspect;  Ant .2x.2)  Closure None  Drainage Description Serous;Purulent (Pt middle finger now has a larger wound upon removal of dressing purulent drainage noted.)  Drainage Amount Small  Dressing Type Gauze (Comment);Silver dressings  Dressing Changed Changed  Dressing Status Old drainage  Margins Attached edges (approximated)  Treatment Cleansed;Other (Comment) (debridement of slough. Very painful to debridement now.)  Wound 07/20/24 0920 Infection Finger (Comment which one) Left  Date First Assessed/Time First Assessed: 07/20/24 0920   Present on Original Admission: No  Primary Wound Type: Infection  Secondary Wound Type - Infection: Abscess  Location: (c) Finger (Comment which one)  Location Orientation: Left  Wound Image  (see below wound images)  Site / Wound Assessment Painful;Yellow  Peri-wound Assessment Edema;Erythema (blanchable)  Wound Length (cm) 1.5 cm  Wound Width (cm) 0.4 cm  Wound Surface Area (cm^2) 0.47 cm^2  Wound Depth (cm)  (unknown at this time)  Drainage Description Purulent  Drainage Amount Small  Treatments Cleansed;Site care;Other (Comment) (debridement and dressing change.)  Dressing Type Silver hydrofiber  Dressing Changed Changed  Dressing Status Old drainage  Wound Therapy - Assess/Plan/Recommendations  Wound Therapy - Clinical Statement see below  Wound Therapy - Functional Problem List see below  Factors Delaying/Impairing Wound Healing Infection - systemic/local  Hydrotherapy  Plan Debridement;Dressing change;Patient/family education;Other (comment) (exercise.)  Wound Therapy - Frequency 2X / week (foir 6 weeks)  Wound Therapy - Current Recommendations PT  Wound Therapy - Follow Up Recommendations Other (comment)  Wound Plan exercise, manual and debridement with dressing change to regain functional use  Wound Therapy  Dressing  Lt index and middle finger:  vaseline applied around wound, silver hydrofiber placed on wound beds followed by 2x2, dressed loosley with 2 gauze netting placed on fingers to assist in keeping dressing in place.     HOME EXERCISE PROGRAM: Access Code: [email or web address] URL: [email or web address] Date: 06/29/2024 Prepared by: Montie Metro  Exercises - Seated Full Fist AROM  - 3 x daily - 7 x weekly - 1 sets - 10 reps - 3 hold - Finger MP Flexion AROM  - 3 x daily - 7 x weekly - 1 sets - 10 reps - 3 hold - Finger Spreading  - 3 x daily - 7 x weekly - 1 sets - 10 reps -  3 hold - Thumb Opposition  - 3 x daily - 7 x weekly - 1 sets - 10 reps - 3 hold - Wrist AROM Flexion Extension  - 3 x daily - 7 x weekly - 1 sets - 10 reps - 3 hold - Seated Wrist Flexor Hook Fist Tendon Gliding  - 3 x daily - 7 x weekly - 1 sets - 10 reps - 3 hold   GOALS: Goals reviewed with patient? Yes  SHORT TERM GOALS: Target date: 07/20/24  Pt to be completing HEP to have regained 70% of full ROM Baseline: Goal status: IN PROGRESS  2.  Pt wounds to be 100% granulated Baseline:  Goal status: IN PROGRESS  3.  Pt pain to be decreased to no greater than a 2/10 Baseline:  Goal status: IN PROGRESS   LONG TERM GOALS: Target date: 01/11/24  PT wounds on Lt hand to be healed  Baseline:  Goal status: IN PROGRESS  2.  Pt to have no pain in his Lt hand Baseline:  Goal status: IN PROGRESS  3.  Pt to state that his Lt hand is fully functioning.  Baseline:  Goal status: IN PROGRESS  ASSESSMENT:  CLINICAL IMPRESSION:  Pt has started a new  antibiotic.  Wound continues to progress on both fingers with noted increased edema, erythema and pain.  Therapist stressed the importance of keeping an eye on the redness and if it increased to go to the ER.   OBJECTIVE IMPAIRMENTS: decreased activity tolerance, decreased ROM, decreased strength, increased edema, increased fascial restrictions, impaired flexibility, pain, and decreased skin integrity.   ACTIVITY LIMITATIONS: carrying, lifting, bathing, dressing, and hygiene/grooming  PARTICIPATION LIMITATIONS: meal prep, cleaning, laundry, driving, shopping, community activity, and yard work   [facility] POTENTIAL: Good  CLINICAL DECISION MAKING: Stable/uncomplicated  EVALUATION COMPLEXITY: Moderate  PLAN: PT FREQUENCY: 2x/week  PT DURATION: 6 weeks  PLANNED INTERVENTIONS: 97110-Therapeutic exercises, 97535- Self Care, 02859- Manual therapy, and 97597- Wound care (first 20 sq cm)  PLAN FOR NEXT SESSION: continue with irrigation, debridement, exercise including gentle PROM and dressing change.   Montie Metro, PT CLT [phone]  07/22/2024, 8:50 AM

## 2024-07-26 ENCOUNTER — Telehealth: Payer: Self-pay | Admitting: Neurology

## 2024-07-26 NOTE — Telephone Encounter (Signed)
 Pt called in this afternoon and he wants a refill on his Muscle Relaxer .  Please send the prescription to the [facility] in [place]. Thanks

## 2024-07-27 ENCOUNTER — Telehealth (HOSPITAL_COMMUNITY): Payer: Self-pay | Admitting: Physical Therapy

## 2024-07-27 ENCOUNTER — Ambulatory Visit (HOSPITAL_COMMUNITY): Admitting: Physical Therapy

## 2024-07-27 MED ORDER — CYCLOBENZAPRINE HCL 10 MG PO TABS: 10.0000 mg | ORAL_TABLET | Freq: Every evening | ORAL | 0 refills | Status: DC | PRN

## 2024-07-27 NOTE — Telephone Encounter (Signed)
 Called and left message that meds were called in .

## 2024-07-27 NOTE — Telephone Encounter (Signed)
 Refill sent.

## 2024-07-27 NOTE — Telephone Encounter (Signed)
 First no show:  Called pt and left a message re missed appointment.  Let pt know that there are later appointment today and when his next appointment is.   Montie Metro, PT CLT [phone]

## 2024-07-29 ENCOUNTER — Ambulatory Visit (HOSPITAL_COMMUNITY): Admitting: Physical Therapy

## 2024-08-03 ENCOUNTER — Ambulatory Visit (HOSPITAL_COMMUNITY): Admitting: Physical Therapy

## 2024-08-05 ENCOUNTER — Ambulatory Visit (HOSPITAL_COMMUNITY): Admitting: Physical Therapy

## 2024-08-05 ENCOUNTER — Ambulatory Visit: Admitting: Orthopedic Surgery

## 2024-08-05 DIAGNOSIS — M6510 Other infective (teno)synovitis, unspecified site: Secondary | ICD-10-CM

## 2024-08-05 DIAGNOSIS — S61321D Laceration with foreign body of left index finger with damage to nail, subsequent encounter: Secondary | ICD-10-CM

## 2024-08-05 MED ORDER — DOXYCYCLINE HYCLATE 100 MG PO TABS: 100.0000 mg | ORAL_TABLET | Freq: Two times a day (BID) | ORAL | 0 refills | Status: DC

## 2024-08-05 NOTE — Progress Notes (Signed)
   POST OP VISIT   Patient: Jason Walsh           Date of Birth: 1961/05/06           MRN: [id number] Visit Date: 08/05/2024 Requested by: Walsh, Jason G, DO [address] Jason Walsh [place],  [place] [address] PCP: Walsh, Jason G, DO   Encounter Diagnoses  Name Primary?   Infectious tenosynovitis LEFT LONG FINGER 06/22/24 Yes   Laceration of left index finger with foreign body and damage to nail, subsequent encounter    PROCEDURE: Status post incision drainage left long finger and debridement left index finger  The patient has had a recurrence of infection of his left long finger.  He said there was purulent drainage that the occupational therapist was able to express but he was unhappy with the care and stopped going to therapy.  He went to his primary care doctor and they put him on doxycycline  stop and the antibiotic he was on previously this seemed to improve the swelling and redness on the dorsum of his left hand although he has some residual purulence near the PIP joint  Pictures are in the chart  I would recommend he go back on doxycycline  for a few more weeks   Allergies  Allergen Reactions   Gabapentin      Pain       Current Outpatient Medications:    amLODipine  (NORVASC ) 10 MG tablet, Take 1 tablet (10 mg total) by mouth daily., Disp: 90 tablet, Rfl: 3   aspirin  EC 81 MG tablet, Take 1 tablet (81 mg total) by mouth daily. Swallow whole., Disp: 30 tablet, Rfl: 12   atorvastatin  (LIPITOR) 40 MG tablet, Take 1 tablet (40 mg total) by mouth daily., Disp: 30 tablet, Rfl: 11   cyclobenzaprine  (FLEXERIL ) 10 MG tablet, Take 1 tablet (10 mg total) by mouth at bedtime as needed for muscle spasms., Disp: 90 tablet, Rfl: 0   doxycycline  (VIBRA -TABS) 100 MG tablet, Take 1 tablet (100 mg total) by mouth 2 (two) times daily., Disp: 20 tablet, Rfl: 0    IMAGING: No results found.   ASSESSMENT AND PLAN:  Meds ordered this encounter  Medications   doxycycline  (VIBRA -TABS) 100 MG  tablet    Sig: Take 1 tablet (100 mg total) by mouth 2 (two) times daily.    Dispense:  20 tablet    Refill:  0   Return in 2 weeks  I have reviewed the patient's history and given the presence of a fragility fracture, I have deemed the necessity of a osteoporosis management referral or confirmed that the patient is currently enrolled in a osteoporosis treatment program.  Per [facility] clinic policy, our goal is ensure optimal postoperative pain control with a multimodal pain management strategy. For all [facility] patients, our goal is to wean post-operative narcotic medications by 6 weeks post-operatively. If this is not possible due to utilization of pain medication prior to surgery, your [facility] doctor will support your acute post-operative pain control for the first 6 weeks postoperatively, with a plan to transition you back to your primary pain team following that. Jason Walsh will work to ensure a Therapist, occupational.

## 2024-08-19 ENCOUNTER — Ambulatory Visit: Admitting: Orthopedic Surgery

## 2024-08-19 DIAGNOSIS — S61321D Laceration with foreign body of left index finger with damage to nail, subsequent encounter: Secondary | ICD-10-CM

## 2024-08-19 DIAGNOSIS — M6510 Other infective (teno)synovitis, unspecified site: Secondary | ICD-10-CM

## 2024-08-19 NOTE — Progress Notes (Signed)
 Chief Complaint  Patient presents with   Routine Post Op    L Long finger  DOS: 06/22/24   Encounter Diagnoses  Name Primary?   Infectious tenosynovitis LEFT LONG FINGER 06/22/24 Yes   Laceration of left index finger with foreign body and damage to nail, subsequent encounter      Follow-up appointment postop from left long finger and right index finger surgery  Wounds are all healed both fingers are stiff the left index finger still swollen  He cannot fully make a fist but he has no infection  Recommend continue active and active assisted range of motion exercises no follow-up needed

## 2024-08-19 NOTE — Progress Notes (Signed)
   There were no vitals taken for this visit.  There is no height or weight on file to calculate BMI.  Chief Complaint  Patient presents with   Routine Post Op    L Long finger  DOS: 06/22/24    No diagnosis found.  DOI/DOS/ Date: 06/22/24  Resolved

## 2024-08-24 DIAGNOSIS — H61001 Unspecified perichondritis of right external ear: Secondary | ICD-10-CM | POA: Diagnosis not present

## 2024-08-24 DIAGNOSIS — C44319 Basal cell carcinoma of skin of other parts of face: Secondary | ICD-10-CM | POA: Diagnosis not present

## 2024-08-24 DIAGNOSIS — C44619 Basal cell carcinoma of skin of left upper limb, including shoulder: Secondary | ICD-10-CM | POA: Diagnosis not present

## 2024-08-24 DIAGNOSIS — C44212 Basal cell carcinoma of skin of right ear and external auricular canal: Secondary | ICD-10-CM | POA: Diagnosis not present

## 2024-09-03 ENCOUNTER — Emergency Department (HOSPITAL_COMMUNITY)
Admission: EM | Admit: 2024-09-03 | Discharge: 2024-09-03 | Disposition: A | Discharge status: Home / Self Care | Attending: Emergency Medicine | Admitting: Emergency Medicine

## 2024-09-03 ENCOUNTER — Emergency Department (HOSPITAL_COMMUNITY)

## 2024-09-03 ENCOUNTER — Other Ambulatory Visit: Payer: Self-pay

## 2024-09-03 ENCOUNTER — Encounter (HOSPITAL_COMMUNITY): Payer: Self-pay

## 2024-09-03 DIAGNOSIS — R0789 Other chest pain: Secondary | ICD-10-CM | POA: Insufficient documentation

## 2024-09-03 DIAGNOSIS — E876 Hypokalemia: Secondary | ICD-10-CM | POA: Insufficient documentation

## 2024-09-03 DIAGNOSIS — R9431 Abnormal electrocardiogram [ECG] [EKG]: Secondary | ICD-10-CM | POA: Insufficient documentation

## 2024-09-03 DIAGNOSIS — D72829 Elevated white blood cell count, unspecified: Secondary | ICD-10-CM | POA: Insufficient documentation

## 2024-09-03 DIAGNOSIS — J449 Chronic obstructive pulmonary disease, unspecified: Secondary | ICD-10-CM | POA: Diagnosis not present

## 2024-09-03 DIAGNOSIS — I10 Essential (primary) hypertension: Secondary | ICD-10-CM | POA: Diagnosis not present

## 2024-09-03 DIAGNOSIS — M25511 Pain in right shoulder: Secondary | ICD-10-CM | POA: Diagnosis not present

## 2024-09-03 DIAGNOSIS — Z79899 Other long term (current) drug therapy: Secondary | ICD-10-CM | POA: Diagnosis not present

## 2024-09-03 DIAGNOSIS — Z7982 Long term (current) use of aspirin: Secondary | ICD-10-CM | POA: Diagnosis not present

## 2024-09-03 LAB — CBC
HCT: 42 % (ref 39.0–52.0)
Hemoglobin: 14.6 g/dL (ref 13.0–17.0)
MCH: 33.7 pg (ref 26.0–34.0)
MCHC: 34.8 g/dL (ref 30.0–36.0)
MCV: 97 fL (ref 80.0–100.0)
Platelets: 341 K/uL (ref 150–400)
RBC: 4.33 MIL/uL (ref 4.22–5.81)
RDW: 12.4 % (ref 11.5–15.5)
WBC: 11.8 K/uL — ABNORMAL HIGH (ref 4.0–10.5)
nRBC: 0 % (ref 0.0–0.2)

## 2024-09-03 LAB — TROPONIN I (HIGH SENSITIVITY)
Troponin I (High Sensitivity): 12 ng/L (ref <18)
Troponin I (High Sensitivity): 12 ng/L (ref <18)

## 2024-09-03 LAB — BASIC METABOLIC PANEL WITH GFR
Anion gap: 15 (ref 5–15)
BUN: 22 mg/dL (ref 8–23)
CO2: 24 mmol/L (ref 22–32)
Calcium: 9 mg/dL (ref 8.9–10.3)
Chloride: 99 mmol/L (ref 98–111)
Creatinine, Ser: 1.11 mg/dL (ref 0.61–1.24)
GFR, Estimated: >60 mL/min (ref >60)
Glucose, Bld: 91 mg/dL (ref 70–99)
Potassium: 3.1 mmol/L — ABNORMAL LOW (ref 3.5–5.1)
Sodium: 138 mmol/L (ref 135–145)

## 2024-09-03 MED ORDER — LIDOCAINE 5 % EX PTCH
1.0000 | MEDICATED_PATCH | CUTANEOUS | Status: DC
  Administered 2024-09-03: 1 via TRANSDERMAL
  Filled 2024-09-03: qty 1

## 2024-09-03 MED ORDER — KETOROLAC TROMETHAMINE 15 MG/ML IJ SOLN
15.0000 mg | Freq: Once | INTRAMUSCULAR | Status: AC
  Administered 2024-09-03: 15 mg via INTRAVENOUS
  Filled 2024-09-03: qty 1

## 2024-09-03 MED ORDER — LIDOCAINE 5 % EX PTCH: 1.0000 | MEDICATED_PATCH | CUTANEOUS | Status: DC

## 2024-09-03 MED ORDER — LIDOCAINE 5 % EX PTCH: 1.0000 | MEDICATED_PATCH | CUTANEOUS | 0 refills | Status: AC

## 2024-09-03 MED ORDER — AMLODIPINE BESYLATE 10 MG PO TABS: 10.0000 mg | ORAL_TABLET | Freq: Every day | ORAL | 2 refills | Status: DC

## 2024-09-03 MED ORDER — POTASSIUM CHLORIDE CRYS ER 20 MEQ PO TBCR
40.0000 meq | EXTENDED_RELEASE_TABLET | Freq: Once | ORAL | Status: AC
  Administered 2024-09-03: 40 meq via ORAL
  Filled 2024-09-03: qty 2

## 2024-09-03 NOTE — Telephone Encounter (Signed)
 Called to schedule appointment and patient answered but stated he was on the way to ER and can't make these appointments at this time. He will give us  a call back when he is better to schedule.  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 Approval number: [id number]  No pymt due.

## 2024-09-03 NOTE — ED Triage Notes (Signed)
 Pt to er, pt states that for the last couple of days he has had L shoulder pain, states that today he is here because he is having having R shoulder pain for the past 1.5days.  pt denies weakness.

## 2024-09-03 NOTE — Discharge Instructions (Addendum)
 Continue Tylenol /ibuprofen  as needed for shoulder pain.  Continue lidocaine  patches as needed for shoulder pain.  You were found to have some EKG changes today that are consistent with long-term uncontrolled blood pressure, please continue your amlodipine  daily as previously directed.  You will be contacted by the cardiology office to schedule follow-up, if you do not hear from them in the coming days please contact the number provided above to schedule follow-up.  Follow-up with your orthopedic specialist or contact the number provided above to schedule follow-up with an orthopedic specialist in regard to your shoulder pain. Return to the emergency department if your symptoms worsen.

## 2024-09-03 NOTE — ED Provider Notes (Signed)
 [place] EMERGENCY DEPARTMENT AT [facility] Provider Note   CSN: [id number] Arrival date & time: 09/03/24  9088     Patient presents with: Shoulder Pain   Jason Walsh is a 63 y.o. male.   26 old male presenting with right shoulder pain.  Patient reports that symptoms been ongoing for several days, initially reports that his left shoulder was bothersome but now it is his right shoulder/chest, states that the pain was so bothersome he could not lie on his right shoulder and had difficulty sleeping.  He describes the pain as like someone is stabbing me with an ice pick.  He took 650 mg of Tylenol  this morning with minimal relief of his symptoms.  He has very limited range of motion secondary to pain.  Denies any fall/trigger/known inciting event.  Denies nausea/vomiting/abdominal pain, shortness of breath.   Shoulder Pain      Prior to Admission medications   Medication Sig Start Date End Date Taking? Authorizing Provider  amLODipine  (NORVASC ) 10 MG tablet Take 1 tablet (10 mg total) by mouth daily. 07/10/24 10/08/24  Simon Lavonia SAILOR, MD  aspirin  EC 81 MG tablet Take 1 tablet (81 mg total) by mouth daily. Swallow whole. 07/10/24   Simon Lavonia SAILOR, MD  atorvastatin  (LIPITOR) 40 MG tablet Take 1 tablet (40 mg total) by mouth daily. 07/10/24 07/10/25  Simon Lavonia SAILOR, MD  cyclobenzaprine  (FLEXERIL ) 10 MG tablet Take 1 tablet (10 mg total) by mouth at bedtime as needed for muscle spasms. 07/27/24   Patel, Donika K, DO  doxycycline  (VIBRA -TABS) 100 MG tablet Take 1 tablet (100 mg total) by mouth 2 (two) times daily. 08/05/24   Margrette Taft BRAVO, MD    Allergies: Gabapentin     Review of Systems  Updated Vital Signs  Vitals:   09/03/24 0919 09/03/24 0920 09/03/24 1145  BP: (!) 173/97  (!) 181/81  Pulse: 67  61  Resp: 18  10  Temp: 97.9 F (36.6 C)    TempSrc: Oral    SpO2: 96%  97%  Weight:  79.4 kg   Height:  5' 9 (1.753 m)      Physical Exam Vitals and nursing note  reviewed.  HENT:     Head: Normocephalic.  Eyes:     Extraocular Movements: Extraocular movements intact.  Neck:     Vascular: No carotid bruit.     Comments: No C-spine TTP Cardiovascular:     Rate and Rhythm: Normal rate and regular rhythm.     Heart sounds: Normal heart sounds.  Pulmonary:     Effort: Pulmonary effort is normal.     Breath sounds: Normal breath sounds.  Musculoskeletal:     Cervical back: Normal range of motion.     Comments: TTP of R anterior shoulder/chest wall, no obvious bony deformity/erythema/warmth. Significantly limited active and passive ROM at the R shoulder secondary to pain, including overhead reach and adduction. Full active/passive ROM of the L shoulder.   Skin:    General: Skin is warm and dry.  Neurological:     Mental Status: He is alert and oriented to person, place, and time.     (all labs ordered are listed, but only abnormal results are displayed) Labs Reviewed  BASIC METABOLIC PANEL WITH GFR - Abnormal; Notable for the following components:      Result Value   Potassium 3.1 (*)    All other components within normal limits  CBC - Abnormal; Notable for the following components:  WBC 11.8 (*)    All other components within normal limits  TROPONIN I (HIGH SENSITIVITY)  TROPONIN I (HIGH SENSITIVITY)    EKG: EKG Interpretation Date/Time:  Friday September 03 2024 10:54:08 EDT Ventricular Rate:  61 PR Interval:  145 QRS Duration:  146 QT Interval:  491 QTC Calculation: 495 R Axis:   -58  Text Interpretation: Sinus rhythm Probable left atrial enlargement Right bundle branch block LVH with IVCD and secondary repol abnrm diffuse t wave inversion Compared to previous tracing Significant changes have occurred Confirmed by Francesca Fallow ([id number]) on 09/03/2024 11:09:47 AM  Radiology: ARCOLA Shoulder Right Result Date: 09/03/2024 CLINICAL DATA:  Right shoulder pain. EXAM: RIGHT SHOULDER - 2+ VIEW COMPARISON:  None Available. FINDINGS: Mild  degenerate change of the [facility] joint. Glenohumeral joint is within normal. No acute fracture or dislocation. No focal lytic or sclerotic lesion. IMPRESSION: 1. No acute findings. 2. Mild degenerative change of the [facility] joint. Electronically Signed   By: Toribio Agreste M.D.   On: 09/03/2024 10:57   DG Chest 2 View Result Date: 09/03/2024 CLINICAL DATA:  Chest pain and right shoulder pain. EXAM: CHEST - 2 VIEW COMPARISON:  01/15/2019 FINDINGS: Lungs are adequately inflated and otherwise clear. Cardiomediastinal silhouette and remainder of the exam is unchanged. IMPRESSION: No active cardiopulmonary disease. Electronically Signed   By: Toribio Agreste M.D.   On: 09/03/2024 10:57     Procedures   Medications Ordered in the ED  lidocaine  (LIDODERM ) 5 % 1 patch (1 patch Transdermal Patch Applied 09/03/24 1049)  ketorolac  (TORADOL ) 15 MG/ML injection 15 mg (15 mg Intravenous Given 09/03/24 1048)  potassium chloride  SA (KLOR-CON  M) CR tablet 40 mEq (40 mEq Oral Given 09/03/24 1156)                                    Medical Decision Making This patient presents to the ED for concern of R shoulder pain, this involves an extensive number of treatment options, and is a complaint that carries with it a high risk of complications and morbidity.  The differential diagnosis includes fracture, dislocation, muscle spasm, osteoarthritis, radiculopathy   Co morbidities that complicate the patient evaluation  Recent left long finger flexor tenosynovitis requiring surgical debridement, hypertension, COPD   Additional history obtained:  Additional history obtained from record review External records from outside source obtained and reviewed including recent family medicine and Ortho care notes as well as previous ED discharge notes   Lab Tests:  I Ordered, and personally interpreted labs.  The pertinent results include: CBC notable for mild leukocytosis with white blood cell count of 11.8, this is elevated as  compared to most recent baseline of 7.8.  BMP notable for hypokalemia with potassium of 3.1. Initial troponin 12, repeat remains unchanged.    Imaging Studies ordered:  I ordered imaging studies including CXR, R shoulder XR  I independently visualized and interpreted imaging which showed  - CXR: No active cardiopulmonary disease. - R shoulder: 1. No acute findings. 2. Mild degenerative change of the [facility] joint.  I agree with the radiologist interpretation   Cardiac Monitoring: / EKG:  The patient was maintained on a cardiac monitor.  I personally viewed and interpreted the cardiac monitored which showed an underlying rhythm of: NSR with diffuse T wave inversions, significant changes compared to previous    Consultations Obtained:  I requested consultation with the cardiology service,  and  discussed lab and imaging findings as well as pertinent plan - they recommend: I spoke with Dr. Alvan via phone/secure chat who advises: EKG changes are consistent with severe LVH. prior EKG was 5 years ago, severe hypertension in ER looks like not taking any meds at home suspect has developed severe LVH over that time. noncardiac chest pains with negative troponin, no additional inpatient workup would be needed, we can see him as an outpatient. would start back his home norvasc . Dr. Ranae office will schedule the patient a new patient appointment to establish care.    Problem List / ED Course / Critical interventions / Medication management  I ordered medication including Toradol  and lidocaine  patch for right shoulder pain, potassium for hypokalemia  Reevaluation of the patient after these medicines showed that the patient improved I have reviewed the patients home medicines and have made adjustments as needed   Social Determinants of Health:  Tobacco use   Test / Admission - Considered:  Physical exam notable as above, patient with tenderness to palpation of anterior shoulder/chest wall  with range of motion at the shoulder significantly limited secondary to pain.  Given that pain does tend to radiate to the right chest, will workup for ACS. Mild hypokalemia noted, p.o. potassium given in the emergency department today. EKG notable for diffuse T wave inversions, this is significant change from his baseline.  Initial troponin of 12, repeat remains unchanged I discussed patient's EKG changes with him, he denies chest pain but reports occasional fluttering sensation in his chest.  He is not currently followed by cardiologist.  See above for discussion with Dr. Alvan. At time my reassessment, patient does note some improvement in his discomfort, however range of motion is still limited secondary to pain.  I discussed his reassuring x-ray findings with him, symptoms today may be secondary to osteoarthritis of the shoulder versus rotator cuff injury.  I discussed with the patient that it is necessary that he restart his amlodipine  for better control of his hypertension, as long-term uncontrolled hypertension can result in stroke, ACS, kidney failure, etc. He is in agreement with this, he understands that he will be contacted by the cardiologist to schedule follow-up.  Will provide the patient with the contact information for an orthopedic specialist to schedule follow-up in regard to his shoulder pain, he is in agreement with this plan.   Case discussed with Dr. Francesca   Amount and/or Complexity of Data Reviewed Labs: ordered. Radiology: ordered.  Risk Prescription drug management.        Final diagnoses:  Right shoulder pain, unspecified chronicity  Abnormal EKG    ED Discharge Orders          Ordered    amLODipine  (NORVASC ) 10 MG tablet  Daily        09/03/24 1322    lidocaine  (LIDODERM ) 5 %  Every 24 hours        09/03/24 1326               Glendia Rocky SAILOR, PA-C 09/03/24 1328    Francesca Elsie CROME, MD 09/03/24 1523

## 2024-10-12 DIAGNOSIS — C44319 Basal cell carcinoma of skin of other parts of face: Secondary | ICD-10-CM | POA: Diagnosis not present

## 2024-10-12 NOTE — Progress Notes (Signed)
 Dermatologic Surgery Note   Referring Physician:   Damien Wanda Pardon, PA-C [id number] [place],  [place] [address]   History: Tumor: Basal Cell Carcinoma (Right Preauricular/Right Cheek) Previous Treatment: No Immunosuppressed: No  Final Diagnosis  A. SKIN, RIGHT EAR, BIOPSY:               CHONDRODERMATITIS NODULARIS HELICIS. (Deeper levels have been reviewed.)   B. 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   (Deeper levels have been reviewed.)     C. SKIN, RIGHT CHEEK, BIOPSY:              BASAL CELL CARCINOMA, INFILTRATIVE TYPE, EXTENDING TO THE BASE OF THE BIOPSY.   D. 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    Electronically signed by Jon Ronnald Nephew, MD on 08/31/2024 at 1628 EDT       Allergies[1].    _____________________________________________________ PE: Vitals:   10/12/24 0848  BP: (!) 195/96  Pulse: 59    There is a lesion identified and confirmed as the biopsy site by the patient consistent with a Basal Cell Carcinoma (Right Preauricular/Right Cheek) .   Plan:  Basal Cell Carcinoma (Right Preauricular/Right Cheek)  The diagnosis, tumor biology, treatment options and indications for Mohs surgery being tumor size, anatomic location, and indistinct margins were discussed.  After discussion of the risks and benefits including but not limited to scar, bleeding, nerve damage(and resultant relevant deficit), infection, prolonged wound healing, incomplete removal, and recurrence, informed consent was obtained.  The treatment site was clearly identified and confirmed by the patient. A timeout was performed and the above tumor was excised using Mohs technique followed by primary repair. (See operative note for details).  Medications prescribed at this visit: No orders of the defined types were placed in this  encounter.   Follow Up: The patient was provided with verbal and written post-operative instructions including wound care, signs and symptoms of an infection, and when and how to contact our office.  The patient will return:as scheduled for subsequent procedures. After the appropriate surgical follow-up, the patient will be referred back to their referring physician for further care.   Return for mohs to [facility] left shoulder       [1] Allergies Allergen Reactions  . Ibuprofen  Hives    Chest pains

## 2024-10-12 NOTE — Progress Notes (Signed)
 Surgical sites cleansed. Vaseline, adaptic, and pressure bandages applied to right preauricular and right cheek.  Patient instructed to leave bandages intact for 48 hours. Written and verbal wound care instructions reviewed/given. Patient verbalized understanding. All questions and concerns addressed.   Photo captured and placed in chart.    Raenelle MARLA Kays, RN

## 2024-10-13 NOTE — Telephone Encounter (Signed)
 Unsuccessful in reaching pt for post-op call. Left message to return call if patient has questions or concerns.  Raenelle MARLA Kays, RN

## 2024-10-17 NOTE — Progress Notes (Unsigned)
 CARDIOLOGY CONSULT NOTE       Patient ID: Jason Walsh MRN: [id number] DOB/AGE: Aug 29, 1961 63 y.o.  Referring Physician: [facility] Primary Physician: Cook, [name], DO Primary Cardiologist: New Reason for Consultation: Abnormal ECG    HPI:  63 y.o. referred by [facility] [place] for abnormal ECG.  Note seen there 09/03/24 for muscular right shoulder pain. XR with right AC joint arthritis.  Quite HTN and no taking any meds at home. ECG with LVH with strain similar to prior ECG;s  Troponin negative Rx with lidoderm  patch and started on norvasc  10mg .  Smoker with COPD Seems to have poor medical F/U 10/12/24 had fairly extensive right facial surgery for basal cell carcinoma. HLD supposed to be on statin ? Compliance.   ECG reviewed 09/07/24 SR rate 72 RBBB, LAD LVH with strain and lateral and inferior T wave inversions   He is legally separated over 25 years. Has 2 daughter local. He is a gaffer with carpentry , welding and pipe fitting. Semi retired now with recent left hand injury Multiple [place] surgeries on face for basal cell   ROS All other systems reviewed and negative except as noted above  Past Medical History:  Diagnosis Date   Cancer (HCC) 2010   skin   Lumbar disc disorder    Stroke [facility]) 2016    Family History  Problem Relation Age of Onset   Cancer Mother        lymphoma   Hypertension Mother    Parkinson's disease Father    Alzheimer's disease Father     Social History   Socioeconomic History   Marital status: Legally Separated    Spouse name: Not on file   Number of children: Not on file   Years of education: Not on file   Highest education level: Not on file  Occupational History   Not on file  Tobacco Use   Smoking status: Every Day    Current packs/day: 1.00    Average packs/day: 1 pack/day for 55.7 years (55.7 ttl pk-yrs)    Types: Cigarettes    Start date: 02/02/1969   Smokeless tobacco: Never  Vaping Use   Vaping status: Never Used   Substance and Sexual Activity   Alcohol use: Yes    Comment: occasional   Drug use: Yes    Types: Marijuana    Comment: occasional .  no cocaine sine 2016   Sexual activity: Yes    Partners: Female  Other Topics Concern   Not on file  Social History Narrative   Right Handed    Lives in one story home. Lives alone   Social Drivers of Health   Financial Resource Strain: Low Risk  (01/15/2019)   Overall Financial Resource Strain (CARDIA)    Difficulty of Paying Living Expenses: Not very hard  Food Insecurity: No Food Insecurity (06/21/2024)   Hunger Vital Sign    Worried About Running Out of Food in the Last Year: Never true    Ran Out of Food in the Last Year: Never true  Transportation Needs: No Transportation Needs (06/21/2024)   PRAPARE - Administrator, Civil Service (Medical): No    Lack of Transportation (Non-Medical): No  Physical Activity: Not on file  Stress: No Stress Concern Present (01/15/2019)   [employer] of Occupational Health - Occupational Stress Questionnaire    Feeling of Stress : Only a little  Social Connections: Not on file  Intimate Partner Violence: Not At Risk (  06/21/2024)   Humiliation, Afraid, Rape, and Kick questionnaire    Fear of Current or Ex-Partner: No    Emotionally Abused: No    Physically Abused: No    Sexually Abused: No    Past Surgical History:  Procedure Laterality Date   FINGER SURGERY     L index and pinky   IRRIGATION AND DEBRIDEMENT KNEE Right 07/10/2017   Procedure: RIGHT KNEE SCOPE IRRIGATION AND DEBRIDEMENT;  Surgeon: Gerome Charleston, MD;  Location: [facility];  Service: Orthopedics;  Laterality: Right;   MENISCUS REPAIR  09/09/2017   MOHS SURGERY Left    back   NOSE SURGERY     skin cancer   SKIN CANCER EXCISION Bilateral    basal cell   TENOLYSIS Left 06/22/2024   Procedure: INCISION, TENDON SHEATH;  Surgeon: Margrette Taft BRAVO, MD;  Location: [facility];  Service: Orthopedics;  Laterality: Left;  incision and  drainage left middle/long finger and debridement left index finger      Current Outpatient Medications:    cyclobenzaprine  (FLEXERIL ) 10 MG tablet, Take 1 tablet (10 mg total) by mouth at bedtime as needed for muscle spasms., Disp: 90 tablet, Rfl: 2   lidocaine  (LIDODERM ) 5 %, Place 1 patch onto the skin daily. Remove & Discard patch within 12 hours or as directed by MD (Patient not taking: Reported on 10/20/2024), Disp: 30 patch, Rfl: 0    Physical Exam: Blood pressure 126/68, pulse 80, height 5' 9 (1.753 m), weight 158 lb (71.7 kg), SpO2 96%.    Affect appropriate Chronically ill male Looks older than stated age  HEENT: recent right facial surgery scars over pre auricular area and cheek Neck supple with no adenopathy JVP normal no bruits no thyromegaly Lungs clear with no wheezing and good diaphragmatic motion Heart:  S1/S2 no murmur, no rub, gallop or click PMI normal Abdomen: benighn, BS positve, no tenderness, no AAA no bruit.  No HSM or HJR Distal pulses intact with no bruits No edema Neuro non-focal Skin warm and dry No muscular weakness   Labs:   Lab Results  Component Value Date   WBC 11.8 (H) 09/03/2024   HGB 14.6 09/03/2024   HCT 42.0 09/03/2024   MCV 97.0 09/03/2024   PLT 341 09/03/2024   No results for input(s): NA, K, CL, CO2, BUN, CREATININE, CALCIUM , PROT, BILITOT, ALKPHOS, ALT, AST, GLUCOSE in the last 168 hours.  Invalid input(s): LABALBU Lab Results  Component Value Date   CKTOTAL 42 (L) 07/12/2017   TROPONINI <0.03 01/03/2015    Lab Results  Component Value Date   CHOL 133 06/22/2024   CHOL 164 04/22/2024   CHOL 180 09/17/2022   Lab Results  Component Value Date   HDL 33 (L) 06/22/2024   HDL 38 (L) 04/22/2024   HDL 42 09/17/2022   Lab Results  Component Value Date   LDLCALC 81 06/22/2024   LDLCALC 104 (H) 04/22/2024   LDLCALC 93 09/17/2022   Lab Results  Component Value Date   TRIG 93 06/22/2024    TRIG 122 04/22/2024   TRIG 271 (H) 09/17/2022   Lab Results  Component Value Date   CHOLHDL 4.0 06/22/2024   CHOLHDL 4.3 04/22/2024   CHOLHDL 4.3 09/17/2022   No results found for: LDLDIRECT    Radiology: No results found.  EKG: See HPI   ASSESSMENT AND PLAN:   Abnormal ECG:  may represent LVH. No chest pain to suggest ischemia TTE to assess LV wall thickness, HOCM or RWMA;s. Calcium   score to screen for CAD and determine need for stress testing HTN:  started on Norvasc  10 mg in ER He has stopped this but BP is normal in office monitor with primary Low sodium DASH diet ? Elevated in ER from shoulder pain  HLD:  on statin LDL 81 Adjust dose based on calcium  score COPD:  screen lungs with over read of calcium  score. Smokes 1 ppd no motivation to quit  Basal Cell:  F/U dermatiology  Calcium  score Echo  F/U PRN pending tests   Signed: Maude Emmer 10/20/2024, 3:47 PM

## 2024-10-18 ENCOUNTER — Ambulatory Visit: Payer: Medicaid Other | Admitting: Neurology

## 2024-10-18 VITALS — BP 164/86 | HR 69 | Ht 69.0 in | Wt 156.4 lb

## 2024-10-18 DIAGNOSIS — M47812 Spondylosis without myelopathy or radiculopathy, cervical region: Secondary | ICD-10-CM

## 2024-10-18 MED ORDER — CYCLOBENZAPRINE HCL 10 MG PO TABS: 10.0000 mg | ORAL_TABLET | Freq: Every evening | ORAL | 2 refills | Status: AC | PRN

## 2024-10-18 NOTE — Progress Notes (Signed)
 Follow-up Visit   Date: 10/18/2024    Jason Walsh MRN: [id number] DOB: 28-Apr-1961    Jason Walsh is a 63 y.o. right-handed Caucasian male with hypertension, hyperlipidemia, tobacco use, and skin cancer returning to the clinic for follow-up of neck pain.  The patient was accompanied to the clinic by self.   IMPRESSION/PLAN: Cervical spondylosis worse at C5-6 where there is spinal canal stenosis and bilateral neural foraminal stenosis (severe), stable.   - PT decline  - Continue flexeril  10mg  at bedtime as needed - refilled  Return to clinic in 1 year  --------------------------------------------- History of present illness: For the past 20 years, he has numbness/tingling in the arms and legs.  He occasionally has pain that radiates from his neck into the arms.  Other times, he has bilateral leg and hip pain.  He feels that symptoms are worse with activity. He took a friends gabapentin  which helped with pain.  EMG shows mild sensory neuropathy. MRI lumbar spine shows mild degenerative changes without compressive pathology.  He is referred for further evaluation.     He works as a music therapist.   UPDATE 03/06/2023:  He was scheduled to have EMG of the arms, but patient reports having new neck pain which started 4 days ago which has greatly limited his range of motion and causing severe neck pain shooting down his arms and back.  He has been sleeping in a recliner for the past several days because he is unable to get back up out of bed due to pain.  He has reduced arm ROM and pain-limiting weakness.  He has been taking gabapentin  for pain, but nothing improves it.    Due to severity of pain, EMG was cancelled today and he was seen as a follow-up visit.   UPDATE 07/28/2023:  He is here for follow-up visit.  He reports that neck pain is doing better since taking flexeril  10mg  at bedtime.  He stopped gabapentin  because he felt that it was causing worsening shooting pain through his body.   He works 4-5 hours daily and takes breaks as needed for his neck and low back pain.   UPDATE 02/02/2024:  He is here for follow-up visit.  He had a near slip with his left leg two weeks ago and has achy pain involving the left hip and low back. He has long history of low back pain.  He did PT many years ago and does not think it will help.  He has flexeril  10mg  which he takes for his neck.    UPDATE 10/18/2024:  He is here for follow-up visit.  He ran out of flexeril  several weeks ago and has not been taking this for neck pain.  He reports neck pain is not has severe as before.  He denies painful paresthesias.  No new complaints.   Medications:  Current Outpatient Medications on File Prior to Visit  Medication Sig Dispense Refill   amLODipine  (NORVASC ) 10 MG tablet Take 1 tablet (10 mg total) by mouth daily. 30 tablet 2   aspirin  EC 81 MG tablet Take 1 tablet (81 mg total) by mouth daily. Swallow whole. (Patient not taking: Reported on 09/03/2024) 30 tablet 12   atorvastatin  (LIPITOR) 40 MG tablet Take 1 tablet (40 mg total) by mouth daily. (Patient not taking: Reported on 09/03/2024) 30 tablet 11   cyclobenzaprine  (FLEXERIL ) 10 MG tablet Take 1 tablet (10 mg total) by mouth at bedtime as needed for muscle spasms. 90 tablet 0  lidocaine  (LIDODERM ) 5 % Place 1 patch onto the skin daily. Remove & Discard patch within 12 hours or as directed by MD 30 patch 0   No current facility-administered medications on file prior to visit.    Allergies:  Allergies  Allergen Reactions   Gabapentin      Pain     Vital Signs:  BP (!) 164/86   Pulse 69   Ht 5' 9 (1.753 m)   Wt 156 lb 6.4 oz (70.9 kg)   SpO2 99%   BMI 23.10 kg/m   Neurological Exam: MENTAL STATUS including orientation to time, place, person, recent and remote memory, attention span and concentration, language, and fund of knowledge is normal.  Speech is not dysarthric, although he tends to mumble  CRANIAL NERVES:   Pupils equal round  and reactive to light.  Normal conjugate, extra-ocular eye movements in all directions of gaze.  No ptosis.  Face is symmetric.   MOTOR:  Motor strength is 5/5 in the arms and legs.  No atrophy, fasciculations or abnormal movements.  No pronator drift.  Tone is normal.    MSRs:  Reflexes are 2+/4 throughout.   SENSORY:  Intact to vibration throughout.  COORDINATION/GAIT:    Intact rapid alternating movements bilaterally. Gait appears normal, unassisted.   Data:  MRI cervical spine wo contrast 04/11/2023: 1. Multilevel cervical spondylosis, worst at C5-6, where there is moderate spinal canal stenosis and severe bilateral neural foraminal narrowing. 2. Mild spinal canal stenosis at C3-4, C4-5, and C6-7. 3. Multilevel moderate to severe neural foraminal narrowing, as described above.  MRI lumbar spine wo contrast 12/10/2022: 1. Mild lumbar spine spondylosis as described above. 2. No acute osseous injury of the lumbar spine.   Thank you for allowing me to participate in patient's care.  If I can answer any additional questions, I would be pleased to do so.    Sincerely,    Konya Fauble K. Tobie, DO

## 2024-10-20 ENCOUNTER — Encounter: Payer: Self-pay | Admitting: Cardiovascular Disease

## 2024-10-20 ENCOUNTER — Ambulatory Visit: Attending: Cardiovascular Disease | Admitting: Cardiovascular Disease

## 2024-10-20 VITALS — BP 126/68 | HR 80 | Ht 69.0 in | Wt 158.0 lb

## 2024-10-20 DIAGNOSIS — R9431 Abnormal electrocardiogram [ECG] [EKG]: Secondary | ICD-10-CM | POA: Diagnosis not present

## 2024-10-20 DIAGNOSIS — Z720 Tobacco use: Secondary | ICD-10-CM | POA: Insufficient documentation

## 2024-10-20 DIAGNOSIS — E782 Mixed hyperlipidemia: Secondary | ICD-10-CM | POA: Diagnosis not present

## 2024-10-20 DIAGNOSIS — C44619 Basal cell carcinoma of skin of left upper limb, including shoulder: Secondary | ICD-10-CM | POA: Diagnosis not present

## 2024-10-20 NOTE — Patient Instructions (Signed)
 Medication Instructions:  Your physician recommends that you continue on your current medications as directed. Please refer to the Current Medication list given to you today.  *If you need a refill on your cardiac medications before your next appointment, please call your pharmacy*  Lab Work: NONE   If you have labs (blood work) drawn today and your tests are completely normal, you will receive your results only by: MyChart Message (if you have MyChart) OR A paper copy in the mail If you have any lab test that is abnormal or we need to change your treatment, we will call you to review the results.  Testing/Procedures: Your physician has requested that you have an echocardiogram. Echocardiography is a painless test that uses sound waves to create images of your heart. It provides your doctor with information about the size and shape of your heart and how well your heart's chambers and valves are working. This procedure takes approximately one hour. There are no restrictions for this procedure. Please do NOT wear cologne, perfume, aftershave, or lotions (deodorant is allowed). Please arrive 15 minutes prior to your appointment time.  Please note: We ask at that you not bring children with you during ultrasound (echo/ vascular) testing. Due to room size and safety concerns, children are not allowed in the ultrasound rooms during exams. Our front office staff cannot provide observation of children in our lobby area while testing is being conducted. An adult accompanying a patient to their appointment will only be allowed in the ultrasound room at the discretion of the ultrasound technician under special circumstances. We apologize for any inconvenience.  Calcium  Score    Follow-Up: At [facility], you and your health needs are our priority.  As part of our continuing mission to provide you with exceptional heart care, our providers are all part of one team.  This team includes your  primary Cardiologist (physician) and Advanced Practice Providers or APPs (Physician Assistants and Nurse Practitioners) who all work together to provide you with the care you need, when you need it.  Your next appointment:    As Needed   Provider:   Maude Emmer, MD    We recommend signing up for the patient portal called MyChart.  Sign up information is provided on this After Visit Summary.  MyChart is used to connect with patients for Virtual Visits (Telemedicine).  Patients are able to view lab/test results, encounter notes, upcoming appointments, etc.  Non-urgent messages can be sent to your provider as well.   To learn more about what you can do with MyChart, go to [email or web address].   Other Instructions Thank you for choosing [facility]!

## 2024-10-22 ENCOUNTER — Ambulatory Visit: Admitting: Family Medicine

## 2024-10-26 ENCOUNTER — Ambulatory Visit (HOSPITAL_COMMUNITY)
Admission: RE | Admit: 2024-10-26 | Discharge: 2024-10-26 | Disposition: A | Discharge status: Home / Self Care | Source: Ambulatory Visit | Attending: Cardiovascular Disease | Admitting: Cardiovascular Disease

## 2024-10-26 ENCOUNTER — Ambulatory Visit: Payer: Self-pay | Admitting: Cardiovascular Disease

## 2024-10-26 DIAGNOSIS — R9431 Abnormal electrocardiogram [ECG] [EKG]: Secondary | ICD-10-CM | POA: Insufficient documentation

## 2024-10-26 LAB — ECHOCARDIOGRAM COMPLETE
AR max vel: 1.98 cm2
AV Area VTI: 2.31 cm2
AV Area mean vel: 1.85 cm2
AV Mean grad: 6 mmHg
AV Peak grad: 12.4 mmHg
Ao pk vel: 1.76 m/s
Area-P 1/2: 2.37 cm2
S' Lateral: 3.8 cm

## 2024-10-26 NOTE — Progress Notes (Signed)
*  PRELIMINARY RESULTS* Echocardiogram 2D Echocardiogram has been performed.  Jason Walsh 10/26/2024, 3:53 PM

## 2024-10-27 ENCOUNTER — Other Ambulatory Visit: Payer: Self-pay | Admitting: *Deleted

## 2024-10-27 ENCOUNTER — Encounter: Payer: Self-pay | Admitting: *Deleted

## 2024-10-27 DIAGNOSIS — Z122 Encounter for screening for malignant neoplasm of respiratory organs: Secondary | ICD-10-CM

## 2024-10-27 DIAGNOSIS — F17210 Nicotine dependence, cigarettes, uncomplicated: Secondary | ICD-10-CM

## 2024-10-27 DIAGNOSIS — Z87891 Personal history of nicotine dependence: Secondary | ICD-10-CM

## 2024-11-15 ENCOUNTER — Ambulatory Visit: Admitting: Family Medicine

## 2024-11-29 ENCOUNTER — Other Ambulatory Visit (HOSPITAL_COMMUNITY)

## 2024-12-07 ENCOUNTER — Ambulatory Visit: Admitting: Family Medicine

## 2024-12-07 VITALS — BP 138/78 | HR 63 | Temp 97.9°F | Ht 69.0 in | Wt 156.1 lb

## 2024-12-07 DIAGNOSIS — I10 Essential (primary) hypertension: Secondary | ICD-10-CM | POA: Diagnosis not present

## 2024-12-07 DIAGNOSIS — Z85828 Personal history of other malignant neoplasm of skin: Secondary | ICD-10-CM | POA: Diagnosis not present

## 2024-12-07 NOTE — Patient Instructions (Signed)
 Follow up in 6 months.  Consider colon cancer screening.  Take care  Dr. Bluford

## 2024-12-09 NOTE — Assessment & Plan Note (Signed)
 Remnant of dissolvable stitch removed today.

## 2024-12-09 NOTE — Progress Notes (Signed)
 "  Subjective:  Patient ID: Jason Walsh, male    DOB: 07/30/1961  Age: 64 y.o. MRN: [id number]  CC:   Chief Complaint  Patient presents with   Follow-up    Patient is here for a follow up visit  Patient stated he has stitches in his left shoulder looked at     HPI:  64 year old male presents for follow-up.  BP is reasonably well-controlled without the use of medication.  Patient has not done well with medications in the past and refuses medication at this time.  Patient had recent skin cancer removed.  He would like me to take a look at the area.  He feels like he has a stitch that is still in place.  Continues to smoke.  Patient Active Problem List   Diagnosis Date Noted   History of stroke 09/17/2022   History of substance use 09/17/2022   Chronic right-sided low back pain with right-sided sciatica 09/17/2022   Essential hypertension 09/17/2022   Hyperlipidemia 01/04/2015   Tobacco abuse 01/03/2015   Personal history of skin cancer 03/31/2012    Social Hx   Social History   Socioeconomic History   Marital status: Legally Separated    Spouse name: Not on file   Number of children: Not on file   Years of education: Not on file   Highest education level: Not on file  Occupational History   Not on file  Tobacco Use   Smoking status: Every Day    Current packs/day: 1.00    Average packs/day: 1 pack/day for 55.8 years (55.8 ttl pk-yrs)    Types: Cigarettes    Start date: 02/02/1969   Smokeless tobacco: Never  Vaping Use   Vaping status: Never Used  Substance and Sexual Activity   Alcohol use: Yes    Comment: occasional   Drug use: Yes    Types: Marijuana    Comment: occasional .  no cocaine sine 2016   Sexual activity: Yes    Partners: Female  Other Topics Concern   Not on file  Social History Narrative   Right Handed    Lives in one story home. Lives alone   Social Drivers of Health   Tobacco Use: High Risk (10/20/2024)   Patient History    Smoking  Tobacco Use: Every Day    Smokeless Tobacco Use: Never    Passive Exposure: Not on file  Financial Resource Strain: Not on file  Food Insecurity: No Food Insecurity (06/21/2024)   Epic    Worried About Programme Researcher, Broadcasting/film/video in the Last Year: Never true    Ran Out of Food in the Last Year: Never true  Transportation Needs: No Transportation Needs (06/21/2024)   Epic    Lack of Transportation (Medical): No    Lack of Transportation (Non-Medical): No  Physical Activity: Not on file  Stress: Not on file  Social Connections: Not on file  Depression (EYV7-0): Low Risk (12/07/2024)   Depression (PHQ2-9)    PHQ-2 Score: 0  Alcohol Screen: Not on file  Housing: Low Risk (06/21/2024)   Epic    Unable to Pay for Housing in the Last Year: No    Number of Times Moved in the Last Year: 0    Homeless in the Last Year: No  Utilities: Not At Risk (06/21/2024)   Epic    Threatened with loss of utilities: No  Health Literacy: Not on file    Review of Systems Per HPI  Objective:  BP 138/78 (BP Location: Left Arm, Cuff Size: Normal)   Pulse 63   Temp 97.9 F (36.6 C)   Ht 5' 9 (1.753 m)   Wt 156 lb 2 oz (70.8 kg)   SpO2 98%   BMI 23.06 kg/m      12/07/2024    3:18 PM 12/07/2024    2:58 PM 10/20/2024    3:36 PM  BP/Weight  Systolic BP 138 154 126  Diastolic BP 78 80 68  Wt. (Lbs)  156.13 158  BMI  23.06 kg/m2 23.33 kg/m2    Physical Exam Vitals and nursing note reviewed.  Constitutional:      General: He is not in acute distress.    Appearance: Normal appearance.  HENT:     Head: Normocephalic and atraumatic.  Eyes:     General:        Right eye: No discharge.        Left eye: No discharge.     Conjunctiva/sclera: Conjunctivae normal.  Cardiovascular:     Rate and Rhythm: Normal rate and regular rhythm.  Pulmonary:     Effort: Pulmonary effort is normal.     Breath sounds: Normal breath sounds. No wheezing, rhonchi or rales.  Skin:    Comments: Posterior left shoulder  with well-healed wound from skin cancer removal.  There is a stitch still in place.  Neurological:     Mental Status: He is alert.     Lab Results  Component Value Date   WBC 11.8 (H) 09/03/2024   HGB 14.6 09/03/2024   HCT 42.0 09/03/2024   PLT 341 09/03/2024   GLUCOSE 91 09/03/2024   CHOL 133 06/22/2024   TRIG 93 06/22/2024   HDL 33 (L) 06/22/2024   LDLCALC 81 06/22/2024   ALT 13 04/22/2024   AST 17 04/22/2024   NA 138 09/03/2024   K 3.1 (L) 09/03/2024   CL 99 09/03/2024   CREATININE 1.11 09/03/2024   BUN 22 09/03/2024   CO2 24 09/03/2024   INR 0.92 01/14/2019   HGBA1C 5.5 09/17/2022     Assessment & Plan:  Essential hypertension Assessment & Plan: Stable.  Will continue to monitor.   Personal history of skin cancer Assessment & Plan: Remnant of dissolvable stitch removed today.    Follow-up:  6 months  Tariah Transue Bluford DO [facility] "

## 2024-12-09 NOTE — Assessment & Plan Note (Signed)
 Stable. Will continue to monitor.

## 2024-12-30 ENCOUNTER — Ambulatory Visit (HOSPITAL_COMMUNITY): Admission: RE | Admit: 2024-12-30 | Source: Ambulatory Visit

## 2025-06-07 ENCOUNTER — Ambulatory Visit: Admitting: Family Medicine

## 2025-10-24 ENCOUNTER — Ambulatory Visit: Admitting: Neurology
